# Patient Record
Sex: Male | Born: 1950 | Race: White | Hispanic: No | Smoking: Never smoker
Health system: Southern US, Community
[De-identification: ages and names within clinical notes are randomized; demographics above are authoritative.]

## PROBLEM LIST (undated history)

## (undated) DIAGNOSIS — K635 Polyp of colon: Secondary | ICD-10-CM

## (undated) DIAGNOSIS — L570 Actinic keratosis: Secondary | ICD-10-CM

## (undated) DIAGNOSIS — M1990 Unspecified osteoarthritis, unspecified site: Secondary | ICD-10-CM

## (undated) DIAGNOSIS — E785 Hyperlipidemia, unspecified: Secondary | ICD-10-CM

## (undated) DIAGNOSIS — K5090 Crohn's disease, unspecified, without complications: Secondary | ICD-10-CM

## (undated) DIAGNOSIS — E538 Deficiency of other specified B group vitamins: Secondary | ICD-10-CM

## (undated) DIAGNOSIS — C4490 Unspecified malignant neoplasm of skin, unspecified: Secondary | ICD-10-CM

## (undated) DIAGNOSIS — E039 Hypothyroidism, unspecified: Secondary | ICD-10-CM

## (undated) DIAGNOSIS — I700 Atherosclerosis of aorta: Secondary | ICD-10-CM

## (undated) DIAGNOSIS — G4730 Sleep apnea, unspecified: Secondary | ICD-10-CM

## (undated) DIAGNOSIS — K449 Diaphragmatic hernia without obstruction or gangrene: Secondary | ICD-10-CM

## (undated) DIAGNOSIS — N400 Enlarged prostate without lower urinary tract symptoms: Secondary | ICD-10-CM

## (undated) DIAGNOSIS — Z87442 Personal history of urinary calculi: Secondary | ICD-10-CM

## (undated) DIAGNOSIS — G20 Parkinson's disease: Secondary | ICD-10-CM

## (undated) DIAGNOSIS — N281 Cyst of kidney, acquired: Secondary | ICD-10-CM

## (undated) DIAGNOSIS — G4733 Obstructive sleep apnea (adult) (pediatric): Secondary | ICD-10-CM

## (undated) DIAGNOSIS — K219 Gastro-esophageal reflux disease without esophagitis: Secondary | ICD-10-CM

## (undated) DIAGNOSIS — K4020 Bilateral inguinal hernia, without obstruction or gangrene, not specified as recurrent: Secondary | ICD-10-CM

## (undated) DIAGNOSIS — G459 Transient cerebral ischemic attack, unspecified: Secondary | ICD-10-CM

## (undated) DIAGNOSIS — I4510 Unspecified right bundle-branch block: Secondary | ICD-10-CM

## (undated) DIAGNOSIS — N200 Calculus of kidney: Secondary | ICD-10-CM

## (undated) DIAGNOSIS — K5790 Diverticulosis of intestine, part unspecified, without perforation or abscess without bleeding: Secondary | ICD-10-CM

## (undated) DIAGNOSIS — C8290 Follicular lymphoma, unspecified, unspecified site: Secondary | ICD-10-CM

## (undated) DIAGNOSIS — G20A1 Parkinson's disease without dyskinesia, without mention of fluctuations: Secondary | ICD-10-CM

## (undated) DIAGNOSIS — I10 Essential (primary) hypertension: Secondary | ICD-10-CM

## (undated) HISTORY — PX: PORTACATH PLACEMENT: SHX2246

## (undated) HISTORY — DX: Actinic keratosis: L57.0

## (undated) HISTORY — PX: COLONOSCOPY: SHX174

## (undated) HISTORY — DX: Unspecified malignant neoplasm of skin, unspecified: C44.90

## (undated) HISTORY — PX: ESOPHAGOGASTRODUODENOSCOPY: SHX1529

## (undated) HISTORY — DX: Personal history of urinary calculi: Z87.442

## (undated) HISTORY — DX: Gastro-esophageal reflux disease without esophagitis: K21.9

## (undated) HISTORY — PX: CHOLECYSTECTOMY: SHX55

## (undated) HISTORY — PX: LYMPH NODE DISSECTION: SHX5087

## (undated) HISTORY — DX: Hypothyroidism, unspecified: E03.9

## (undated) HISTORY — PX: PORT-A-CATH REMOVAL: SHX5289

---

## 2006-06-12 ENCOUNTER — Ambulatory Visit: Payer: Self-pay | Admitting: Gastroenterology

## 2006-06-19 ENCOUNTER — Ambulatory Visit: Payer: Self-pay | Admitting: Gastroenterology

## 2006-10-09 ENCOUNTER — Ambulatory Visit: Payer: Self-pay | Admitting: Gastroenterology

## 2012-11-24 DIAGNOSIS — C8290 Follicular lymphoma, unspecified, unspecified site: Secondary | ICD-10-CM

## 2012-11-24 DIAGNOSIS — C8330 Diffuse large B-cell lymphoma, unspecified site: Secondary | ICD-10-CM

## 2012-11-24 HISTORY — DX: Diffuse large B-cell lymphoma, unspecified site: C83.30

## 2012-11-24 HISTORY — DX: Follicular lymphoma, unspecified, unspecified site: C82.90

## 2013-04-04 ENCOUNTER — Inpatient Hospital Stay: Payer: Self-pay | Admitting: Surgery

## 2013-04-04 ENCOUNTER — Ambulatory Visit: Payer: Self-pay | Admitting: Internal Medicine

## 2013-04-04 LAB — COMPREHENSIVE METABOLIC PANEL
Albumin: 4.2 g/dL (ref 3.4–5.0)
Anion Gap: 6 — ABNORMAL LOW (ref 7–16)
Calcium, Total: 9.5 mg/dL (ref 8.5–10.1)
Creatinine: 0.73 mg/dL (ref 0.60–1.30)
EGFR (African American): >60
EGFR (Non-African Amer.): >60
Glucose: 111 mg/dL — ABNORMAL HIGH (ref 65–99)
SGOT(AST): 25 U/L (ref 15–37)
SGPT (ALT): 38 U/L (ref 12–78)
Sodium: 135 mmol/L — ABNORMAL LOW (ref 136–145)
Total Protein: 8.2 g/dL (ref 6.4–8.2)

## 2013-04-04 LAB — CBC
HCT: 48.5 % (ref 40.0–52.0)
MCH: 28.6 pg (ref 26.0–34.0)
MCV: 83 fL (ref 80–100)
RBC: 5.85 10*6/uL (ref 4.40–5.90)
WBC: 18 10*3/uL — ABNORMAL HIGH (ref 3.8–10.6)

## 2013-04-04 LAB — URINALYSIS, COMPLETE
Glucose,UR: NEGATIVE mg/dL (ref 0–75)
Leukocyte Esterase: NEGATIVE
Nitrite: NEGATIVE
RBC,UR: 4 /HPF (ref 0–5)
Specific Gravity: 1.016 (ref 1.003–1.030)
Squamous Epithelial: NONE SEEN
WBC UR: <1 /HPF (ref 0–5)

## 2013-04-05 LAB — COMPREHENSIVE METABOLIC PANEL
Alkaline Phosphatase: 93 U/L (ref 50–136)
Anion Gap: 3 — ABNORMAL LOW (ref 7–16)
BUN: 9 mg/dL (ref 7–18)
Bilirubin,Total: 1 mg/dL (ref 0.2–1.0)
Calcium, Total: 8 mg/dL — ABNORMAL LOW (ref 8.5–10.1)
Chloride: 104 mmol/L (ref 98–107)
Co2: 30 mmol/L (ref 21–32)
Creatinine: 0.84 mg/dL (ref 0.60–1.30)
Glucose: 122 mg/dL — ABNORMAL HIGH (ref 65–99)
Osmolality: 274 (ref 275–301)
SGOT(AST): 21 U/L (ref 15–37)

## 2013-04-05 LAB — CBC WITH DIFFERENTIAL/PLATELET
Eosinophil #: 0.1 10*3/uL (ref 0.0–0.7)
HCT: 42.2 % (ref 40.0–52.0)
Lymphocyte %: 7.4 %
MCHC: 35 g/dL (ref 32.0–36.0)
MCV: 83 fL (ref 80–100)
Monocyte #: 1.7 x10 3/mm — ABNORMAL HIGH (ref 0.2–1.0)
Monocyte %: 12.7 %
RBC: 5.09 10*6/uL (ref 4.40–5.90)
RDW: 13.9 % (ref 11.5–14.5)
WBC: 13.5 10*3/uL — ABNORMAL HIGH (ref 3.8–10.6)

## 2013-04-05 LAB — PROTIME-INR
INR: 1.1
Prothrombin Time: 13.9 secs (ref 11.5–14.7)

## 2013-04-05 LAB — AMYLASE: Amylase: 27 U/L (ref 25–115)

## 2013-04-05 LAB — LIPASE, BLOOD: Lipase: 70 U/L — ABNORMAL LOW (ref 73–393)

## 2013-04-07 LAB — COMPREHENSIVE METABOLIC PANEL
Albumin: 2.8 g/dL — ABNORMAL LOW (ref 3.4–5.0)
Anion Gap: 4 — ABNORMAL LOW (ref 7–16)
BUN: 6 mg/dL — ABNORMAL LOW (ref 7–18)
Bilirubin,Total: 0.5 mg/dL (ref 0.2–1.0)
Calcium, Total: 8.7 mg/dL (ref 8.5–10.1)
Chloride: 106 mmol/L (ref 98–107)
Creatinine: 0.75 mg/dL (ref 0.60–1.30)
EGFR (African American): >60
Glucose: 111 mg/dL — ABNORMAL HIGH (ref 65–99)
Osmolality: 276 (ref 275–301)
Potassium: 3.8 mmol/L (ref 3.5–5.1)
SGPT (ALT): 61 U/L (ref 12–78)
Sodium: 139 mmol/L (ref 136–145)
Total Protein: 6.4 g/dL (ref 6.4–8.2)

## 2013-04-07 LAB — CBC WITH DIFFERENTIAL/PLATELET
Basophil #: 0 10*3/uL (ref 0.0–0.1)
Basophil %: 0.3 %
Eosinophil #: 0.2 10*3/uL (ref 0.0–0.7)
Eosinophil %: 1.7 %
Lymphocyte #: 0.9 10*3/uL — ABNORMAL LOW (ref 1.0–3.6)
Lymphocyte %: 9.7 %
MCHC: 34.3 g/dL (ref 32.0–36.0)
Monocyte #: 1 x10 3/mm (ref 0.2–1.0)
Neutrophil #: 7.2 10*3/uL — ABNORMAL HIGH (ref 1.4–6.5)
Platelet: 203 10*3/uL (ref 150–440)
RDW: 14.2 % (ref 11.5–14.5)
WBC: 9.3 10*3/uL (ref 3.8–10.6)

## 2013-07-27 ENCOUNTER — Ambulatory Visit: Payer: Self-pay | Admitting: Unknown Physician Specialty

## 2013-08-01 ENCOUNTER — Ambulatory Visit: Payer: Self-pay | Admitting: Oncology

## 2013-08-02 ENCOUNTER — Ambulatory Visit: Payer: Self-pay | Admitting: Oncology

## 2013-08-02 LAB — COMPREHENSIVE METABOLIC PANEL
Albumin: 3.7 g/dL (ref 3.4–5.0)
Alkaline Phosphatase: 117 U/L (ref 50–136)
BUN: 13 mg/dL (ref 7–18)
Bilirubin,Total: 0.5 mg/dL (ref 0.2–1.0)
Calcium, Total: 9.2 mg/dL (ref 8.5–10.1)
Chloride: 101 mmol/L (ref 98–107)
Co2: 31 mmol/L (ref 21–32)
EGFR (Non-African Amer.): >60
Glucose: 133 mg/dL — ABNORMAL HIGH (ref 65–99)
Osmolality: 278 (ref 275–301)
Potassium: 3.8 mmol/L (ref 3.5–5.1)
Total Protein: 7.6 g/dL (ref 6.4–8.2)

## 2013-08-02 LAB — CBC CANCER CENTER
Basophil %: 1.1 %
Eosinophil #: 0.2 x10 3/mm (ref 0.0–0.7)
HCT: 48.6 % (ref 40.0–52.0)
HGB: 16.7 g/dL (ref 13.0–18.0)
Lymphocyte %: 16.8 %
MCH: 29.1 pg (ref 26.0–34.0)
MCV: 85 fL (ref 80–100)
Monocyte #: 1.2 x10 3/mm — ABNORMAL HIGH (ref 0.2–1.0)
Platelet: 236 x10 3/mm (ref 150–440)
RBC: 5.74 10*6/uL (ref 4.40–5.90)
RDW: 13.9 % (ref 11.5–14.5)

## 2013-08-08 ENCOUNTER — Ambulatory Visit: Payer: Self-pay | Admitting: Oncology

## 2013-08-10 LAB — CBC CANCER CENTER
Bands: 2 %
Comment - H1-Com1: NORMAL
Comment - H1-Com2: NORMAL
HCT: 46.9 % (ref 40.0–52.0)
MCH: 28.5 pg (ref 26.0–34.0)
MCV: 84 fL (ref 80–100)
Monocytes: 10 %
Myelocyte: 2 %
Platelet: 318 x10 3/mm (ref 150–440)
Segmented Neutrophils: 64 %
WBC: 7.5 x10 3/mm (ref 3.8–10.6)

## 2013-08-22 ENCOUNTER — Ambulatory Visit: Payer: Self-pay | Admitting: Unknown Physician Specialty

## 2013-08-23 ENCOUNTER — Ambulatory Visit: Payer: Self-pay | Admitting: Unknown Physician Specialty

## 2013-08-24 ENCOUNTER — Ambulatory Visit: Payer: Self-pay | Admitting: Oncology

## 2013-09-02 ENCOUNTER — Ambulatory Visit: Payer: Self-pay | Admitting: Surgery

## 2013-09-06 LAB — COMPREHENSIVE METABOLIC PANEL
Albumin: 3.4 g/dL (ref 3.4–5.0)
Alkaline Phosphatase: 111 U/L (ref 50–136)
BUN: 15 mg/dL (ref 7–18)
Bilirubin,Total: 0.4 mg/dL (ref 0.2–1.0)
Chloride: 103 mmol/L (ref 98–107)
Co2: 27 mmol/L (ref 21–32)
Creatinine: 1.08 mg/dL (ref 0.60–1.30)
EGFR (African American): >60
EGFR (Non-African Amer.): >60
Glucose: 158 mg/dL — ABNORMAL HIGH (ref 65–99)
Potassium: 3.8 mmol/L (ref 3.5–5.1)
SGOT(AST): 18 U/L (ref 15–37)
SGPT (ALT): 33 U/L (ref 12–78)

## 2013-09-06 LAB — CBC CANCER CENTER
Basophil #: 0.1 x10 3/mm (ref 0.0–0.1)
Basophil %: 0.7 %
Eosinophil #: 0.4 x10 3/mm (ref 0.0–0.7)
HCT: 47.7 % (ref 40.0–52.0)
Lymphocyte #: 1.9 x10 3/mm (ref 1.0–3.6)
Lymphocyte %: 19.3 %
MCH: 28.9 pg (ref 26.0–34.0)
MCHC: 33.8 g/dL (ref 32.0–36.0)
MCV: 85 fL (ref 80–100)
Neutrophil #: 6.6 x10 3/mm — ABNORMAL HIGH (ref 1.4–6.5)
Neutrophil %: 68.5 %
RBC: 5.59 10*6/uL (ref 4.40–5.90)
WBC: 9.7 x10 3/mm (ref 3.8–10.6)

## 2013-09-14 LAB — CBC CANCER CENTER
Basophil %: 1.2 %
Eosinophil #: 0.3 x10 3/mm (ref 0.0–0.7)
HGB: 15 g/dL (ref 13.0–18.0)
Lymphocyte #: 1.2 x10 3/mm (ref 1.0–3.6)
Lymphocyte %: 25.5 %
MCH: 28.9 pg (ref 26.0–34.0)
MCV: 85 fL (ref 80–100)
Monocyte #: 0.4 x10 3/mm (ref 0.2–1.0)
Monocyte %: 9.3 %
Neutrophil #: 2.6 x10 3/mm (ref 1.4–6.5)
RBC: 5.18 10*6/uL (ref 4.40–5.90)
RDW: 13.4 % (ref 11.5–14.5)
WBC: 4.6 x10 3/mm (ref 3.8–10.6)

## 2013-09-21 LAB — CBC CANCER CENTER
Basophil #: 0.1 x10 3/mm (ref 0.0–0.1)
Basophil %: 0.6 %
HCT: 43.3 % (ref 40.0–52.0)
HGB: 14.5 g/dL (ref 13.0–18.0)
Lymphocyte #: 1.8 x10 3/mm (ref 1.0–3.6)
Lymphocyte %: 12 %
MCH: 28.5 pg (ref 26.0–34.0)
MCV: 85 fL (ref 80–100)
Monocyte #: 1.1 x10 3/mm — ABNORMAL HIGH (ref 0.2–1.0)
Neutrophil %: 79.5 %
Platelet: 183 x10 3/mm (ref 150–440)
RBC: 5.08 10*6/uL (ref 4.40–5.90)
RDW: 14 % (ref 11.5–14.5)
WBC: 15.3 x10 3/mm — ABNORMAL HIGH (ref 3.8–10.6)

## 2013-09-24 ENCOUNTER — Ambulatory Visit: Payer: Self-pay | Admitting: Oncology

## 2013-09-28 LAB — CBC CANCER CENTER
Basophil %: 0.6 %
Eosinophil #: 0.1 x10 3/mm (ref 0.0–0.7)
HCT: 43.3 % (ref 40.0–52.0)
HGB: 14.6 g/dL (ref 13.0–18.0)
MCHC: 33.7 g/dL (ref 32.0–36.0)
MCV: 85 fL (ref 80–100)
Monocyte #: 1.1 x10 3/mm — ABNORMAL HIGH (ref 0.2–1.0)
Monocyte %: 12.9 %
Neutrophil %: 68.6 %
Platelet: 379 x10 3/mm (ref 150–440)
RDW: 13.8 % (ref 11.5–14.5)
WBC: 8.6 x10 3/mm (ref 3.8–10.6)

## 2013-09-28 LAB — COMPREHENSIVE METABOLIC PANEL
Albumin: 3.5 g/dL (ref 3.4–5.0)
Anion Gap: 6 — ABNORMAL LOW (ref 7–16)
BUN: 13 mg/dL (ref 7–18)
Calcium, Total: 8.7 mg/dL (ref 8.5–10.1)
Co2: 28 mmol/L (ref 21–32)
Creatinine: 0.96 mg/dL (ref 0.60–1.30)
EGFR (Non-African Amer.): >60
Glucose: 99 mg/dL (ref 65–99)
Osmolality: 276 (ref 275–301)
SGOT(AST): 23 U/L (ref 15–37)
SGPT (ALT): 50 U/L (ref 12–78)
Sodium: 138 mmol/L (ref 136–145)
Total Protein: 7.1 g/dL (ref 6.4–8.2)

## 2013-10-05 LAB — CBC CANCER CENTER
Basophil #: 0.1 x10 3/mm (ref 0.0–0.1)
Basophil %: 1.3 %
Eosinophil #: 0.1 x10 3/mm (ref 0.0–0.7)
Eosinophil %: 1.5 %
Lymphocyte %: 12.3 %
MCH: 28.6 pg (ref 26.0–34.0)
MCV: 85 fL (ref 80–100)
Monocyte #: 0.7 x10 3/mm (ref 0.2–1.0)
Monocyte %: 7.2 %
Neutrophil #: 7.2 x10 3/mm — ABNORMAL HIGH (ref 1.4–6.5)
Neutrophil %: 77.7 %
RBC: 4.71 10*6/uL (ref 4.40–5.90)
RDW: 14.1 % (ref 11.5–14.5)
WBC: 9.3 x10 3/mm (ref 3.8–10.6)

## 2013-10-12 LAB — CBC CANCER CENTER
Basophil #: 0.1 x10 3/mm (ref 0.0–0.1)
Basophil %: 0.6 %
Eosinophil %: 1.1 %
HCT: 41.1 % (ref 40.0–52.0)
Lymphocyte %: 7 %
MCV: 85 fL (ref 80–100)
Monocyte #: 1.9 x10 3/mm — ABNORMAL HIGH (ref 0.2–1.0)
Neutrophil #: 18.1 x10 3/mm — ABNORMAL HIGH (ref 1.4–6.5)
Neutrophil %: 82.6 %
Platelet: 214 x10 3/mm (ref 150–440)
RBC: 4.81 10*6/uL (ref 4.40–5.90)

## 2013-10-19 LAB — CBC CANCER CENTER
Basophil #: 0 x10 3/mm (ref 0.0–0.1)
Eosinophil #: 0.1 x10 3/mm (ref 0.0–0.7)
HGB: 13.9 g/dL (ref 13.0–18.0)
Lymphocyte #: 1.2 x10 3/mm (ref 1.0–3.6)
Lymphocyte %: 14.8 %
MCH: 28.8 pg (ref 26.0–34.0)
MCV: 86 fL (ref 80–100)
Monocyte #: 1.1 x10 3/mm — ABNORMAL HIGH (ref 0.2–1.0)
Monocyte %: 13.7 %
Platelet: 313 x10 3/mm (ref 150–440)
RBC: 4.81 10*6/uL (ref 4.40–5.90)
RDW: 15.9 % — ABNORMAL HIGH (ref 11.5–14.5)

## 2013-10-19 LAB — COMPREHENSIVE METABOLIC PANEL
Alkaline Phosphatase: 98 U/L
BUN: 15 mg/dL (ref 7–18)
Calcium, Total: 8.7 mg/dL (ref 8.5–10.1)
Chloride: 104 mmol/L (ref 98–107)
EGFR (African American): >60
Potassium: 4 mmol/L (ref 3.5–5.1)
SGOT(AST): 18 U/L (ref 15–37)
Sodium: 141 mmol/L (ref 136–145)
Total Protein: 6.9 g/dL (ref 6.4–8.2)

## 2013-10-24 ENCOUNTER — Ambulatory Visit: Payer: Self-pay | Admitting: Oncology

## 2013-10-26 LAB — CBC CANCER CENTER
Basophil #: 0 x10 3/mm (ref 0.0–0.1)
Basophil %: 0.3 %
Eosinophil #: 0.2 x10 3/mm (ref 0.0–0.7)
HCT: 38.9 % — ABNORMAL LOW (ref 40.0–52.0)
HGB: 13.1 g/dL (ref 13.0–18.0)
Lymphocyte #: 1.9 x10 3/mm (ref 1.0–3.6)
MCHC: 33.7 g/dL (ref 32.0–36.0)
MCV: 86 fL (ref 80–100)
Monocyte #: 1.2 x10 3/mm — ABNORMAL HIGH (ref 0.2–1.0)
Platelet: 291 x10 3/mm (ref 150–440)
RDW: 15.8 % — ABNORMAL HIGH (ref 11.5–14.5)

## 2013-11-02 LAB — CBC CANCER CENTER
Basophil #: 0.2 x10 3/mm — ABNORMAL HIGH (ref 0.0–0.1)
Basophil %: 1.3 %
Eosinophil #: 0.2 x10 3/mm (ref 0.0–0.7)
Eosinophil %: 1.7 %
HCT: 41.8 % (ref 40.0–52.0)
HGB: 13.7 g/dL (ref 13.0–18.0)
Lymphocyte #: 1.5 x10 3/mm (ref 1.0–3.6)
MCH: 28.8 pg (ref 26.0–34.0)
MCHC: 32.7 g/dL (ref 32.0–36.0)
MCV: 88 fL (ref 80–100)
Monocyte %: 10.9 %
Neutrophil #: 9.7 x10 3/mm — ABNORMAL HIGH (ref 1.4–6.5)
Neutrophil %: 74.5 %
WBC: 13 x10 3/mm — ABNORMAL HIGH (ref 3.8–10.6)

## 2013-11-09 LAB — COMPREHENSIVE METABOLIC PANEL
Albumin: 3.5 g/dL (ref 3.4–5.0)
BUN: 15 mg/dL (ref 7–18)
Calcium, Total: 8.4 mg/dL — ABNORMAL LOW (ref 8.5–10.1)
EGFR (African American): >60
EGFR (Non-African Amer.): >60
Glucose: 98 mg/dL (ref 65–99)
Osmolality: 284 (ref 275–301)
SGOT(AST): 19 U/L (ref 15–37)
SGPT (ALT): 40 U/L (ref 12–78)
Sodium: 142 mmol/L (ref 136–145)

## 2013-11-09 LAB — CBC CANCER CENTER
Basophil %: 1 %
Eosinophil #: 0.1 x10 3/mm (ref 0.0–0.7)
Eosinophil %: 1.8 %
MCV: 87 fL (ref 80–100)
Monocyte #: 0.9 x10 3/mm (ref 0.2–1.0)
Monocyte %: 14.2 %
Platelet: 244 x10 3/mm (ref 150–440)
RDW: 17 % — ABNORMAL HIGH (ref 11.5–14.5)

## 2013-11-21 ENCOUNTER — Ambulatory Visit: Payer: Self-pay | Admitting: Oncology

## 2013-11-24 ENCOUNTER — Ambulatory Visit: Payer: Self-pay | Admitting: Oncology

## 2013-12-07 LAB — CBC CANCER CENTER
Basophil #: 0.1 x10 3/mm (ref 0.0–0.1)
Basophil %: 1.1 %
EOS ABS: 0.2 x10 3/mm (ref 0.0–0.7)
Eosinophil %: 3.6 %
HCT: 41.4 % (ref 40.0–52.0)
HGB: 14 g/dL (ref 13.0–18.0)
LYMPHS ABS: 0.9 x10 3/mm — AB (ref 1.0–3.6)
Lymphocyte %: 15.6 %
MCH: 29.6 pg (ref 26.0–34.0)
MCHC: 33.7 g/dL (ref 32.0–36.0)
MCV: 88 fL (ref 80–100)
MONO ABS: 0.9 x10 3/mm (ref 0.2–1.0)
Monocyte %: 15.6 %
Neutrophil #: 3.7 x10 3/mm (ref 1.4–6.5)
Neutrophil %: 64.1 %
Platelet: 331 x10 3/mm (ref 150–440)
RBC: 4.72 10*6/uL (ref 4.40–5.90)
RDW: 15.8 % — ABNORMAL HIGH (ref 11.5–14.5)
WBC: 5.8 x10 3/mm (ref 3.8–10.6)

## 2013-12-07 LAB — COMPREHENSIVE METABOLIC PANEL
ALT: 54 U/L (ref 12–78)
Albumin: 3.9 g/dL (ref 3.4–5.0)
Alkaline Phosphatase: 82 U/L
Anion Gap: 9 (ref 7–16)
BUN: 10 mg/dL (ref 7–18)
Bilirubin,Total: 0.3 mg/dL (ref 0.2–1.0)
CHLORIDE: 103 mmol/L (ref 98–107)
Calcium, Total: 9.4 mg/dL (ref 8.5–10.1)
Co2: 28 mmol/L (ref 21–32)
Creatinine: 0.8 mg/dL (ref 0.60–1.30)
EGFR (Non-African Amer.): >60
GLUCOSE: 98 mg/dL (ref 65–99)
Osmolality: 278 (ref 275–301)
Potassium: 3.8 mmol/L (ref 3.5–5.1)
SGOT(AST): 34 U/L (ref 15–37)
Sodium: 140 mmol/L (ref 136–145)
Total Protein: 7.1 g/dL (ref 6.4–8.2)

## 2013-12-25 ENCOUNTER — Ambulatory Visit: Payer: Self-pay | Admitting: Oncology

## 2014-01-04 LAB — CBC CANCER CENTER
BASOS PCT: 0.5 %
Basophil #: 0 x10 3/mm (ref 0.0–0.1)
EOS ABS: 0.2 x10 3/mm (ref 0.0–0.7)
Eosinophil %: 4.2 %
HCT: 43.7 % (ref 40.0–52.0)
HGB: 14.6 g/dL (ref 13.0–18.0)
LYMPHS ABS: 0.7 x10 3/mm — AB (ref 1.0–3.6)
Lymphocyte %: 12.2 %
MCH: 29.4 pg (ref 26.0–34.0)
MCHC: 33.3 g/dL (ref 32.0–36.0)
MCV: 88 fL (ref 80–100)
MONO ABS: 0.7 x10 3/mm (ref 0.2–1.0)
MONOS PCT: 11.8 %
Neutrophil #: 4 x10 3/mm (ref 1.4–6.5)
Neutrophil %: 71.3 %
Platelet: 247 x10 3/mm (ref 150–440)
RBC: 4.95 10*6/uL (ref 4.40–5.90)
RDW: 12.8 % (ref 11.5–14.5)
WBC: 5.6 x10 3/mm (ref 3.8–10.6)

## 2014-01-11 LAB — CBC CANCER CENTER
BASOS PCT: 0.2 %
Basophil #: 0 x10 3/mm (ref 0.0–0.1)
EOS ABS: 0 x10 3/mm (ref 0.0–0.7)
Eosinophil %: 0.1 %
HCT: 46.1 % (ref 40.0–52.0)
HGB: 15.2 g/dL (ref 13.0–18.0)
LYMPHS PCT: 5.1 %
Lymphocyte #: 0.6 x10 3/mm — ABNORMAL LOW (ref 1.0–3.6)
MCH: 29.2 pg (ref 26.0–34.0)
MCHC: 33 g/dL (ref 32.0–36.0)
MCV: 89 fL (ref 80–100)
MONO ABS: 1.3 x10 3/mm — AB (ref 0.2–1.0)
MONOS PCT: 11.5 %
NEUTROS PCT: 83.1 %
Neutrophil #: 9.4 x10 3/mm — ABNORMAL HIGH (ref 1.4–6.5)
Platelet: 298 x10 3/mm (ref 150–440)
RBC: 5.21 10*6/uL (ref 4.40–5.90)
RDW: 13.1 % (ref 11.5–14.5)
WBC: 11.3 x10 3/mm — ABNORMAL HIGH (ref 3.8–10.6)

## 2014-01-22 ENCOUNTER — Ambulatory Visit: Payer: Self-pay | Admitting: Oncology

## 2014-03-08 ENCOUNTER — Ambulatory Visit: Payer: Self-pay | Admitting: Oncology

## 2014-03-08 LAB — COMPREHENSIVE METABOLIC PANEL
ALT: 36 U/L (ref 12–78)
Albumin: 3.5 g/dL (ref 3.4–5.0)
Alkaline Phosphatase: 81 U/L
Anion Gap: 8 (ref 7–16)
BUN: 15 mg/dL (ref 7–18)
Bilirubin,Total: 0.4 mg/dL (ref 0.2–1.0)
CHLORIDE: 102 mmol/L (ref 98–107)
Calcium, Total: 8.8 mg/dL (ref 8.5–10.1)
Co2: 30 mmol/L (ref 21–32)
Creatinine: 0.8 mg/dL (ref 0.60–1.30)
EGFR (African American): >60
Glucose: 112 mg/dL — ABNORMAL HIGH (ref 65–99)
Osmolality: 281 (ref 275–301)
Potassium: 3.5 mmol/L (ref 3.5–5.1)
SGOT(AST): 19 U/L (ref 15–37)
SODIUM: 140 mmol/L (ref 136–145)
TOTAL PROTEIN: 6.8 g/dL (ref 6.4–8.2)

## 2014-03-08 LAB — CBC CANCER CENTER
BASOS ABS: 0.1 x10 3/mm (ref 0.0–0.1)
Basophil %: 1.1 %
Eosinophil #: 0.2 x10 3/mm (ref 0.0–0.7)
Eosinophil %: 3.8 %
HCT: 41.9 % (ref 40.0–52.0)
HGB: 14.1 g/dL (ref 13.0–18.0)
LYMPHS PCT: 12.6 %
Lymphocyte #: 0.7 x10 3/mm — ABNORMAL LOW (ref 1.0–3.6)
MCH: 29 pg (ref 26.0–34.0)
MCHC: 33.7 g/dL (ref 32.0–36.0)
MCV: 86 fL (ref 80–100)
Monocyte #: 0.9 x10 3/mm (ref 0.2–1.0)
Monocyte %: 16.5 %
Neutrophil #: 3.6 x10 3/mm (ref 1.4–6.5)
Neutrophil %: 66 %
Platelet: 257 x10 3/mm (ref 150–440)
RBC: 4.87 10*6/uL (ref 4.40–5.90)
RDW: 14.3 % (ref 11.5–14.5)
WBC: 5.4 x10 3/mm (ref 3.8–10.6)

## 2014-03-08 LAB — TSH: Thyroid Stimulating Horm: 2.83 u[IU]/mL

## 2014-03-11 DIAGNOSIS — K219 Gastro-esophageal reflux disease without esophagitis: Secondary | ICD-10-CM | POA: Insufficient documentation

## 2014-03-24 ENCOUNTER — Ambulatory Visit: Payer: Self-pay | Admitting: Oncology

## 2014-05-22 ENCOUNTER — Ambulatory Visit: Payer: Self-pay | Admitting: Gastroenterology

## 2014-05-30 ENCOUNTER — Ambulatory Visit: Payer: Self-pay | Admitting: Anesthesiology

## 2014-05-30 LAB — POTASSIUM: POTASSIUM: 3.5 mmol/L (ref 3.5–5.1)

## 2014-06-01 ENCOUNTER — Ambulatory Visit: Payer: Self-pay | Admitting: Surgery

## 2014-06-05 ENCOUNTER — Ambulatory Visit: Payer: Self-pay | Admitting: Gastroenterology

## 2014-06-07 LAB — PATHOLOGY REPORT

## 2014-06-28 ENCOUNTER — Ambulatory Visit: Payer: Self-pay | Admitting: Urology

## 2014-06-29 ENCOUNTER — Ambulatory Visit: Payer: Self-pay | Admitting: Gastroenterology

## 2014-07-05 ENCOUNTER — Ambulatory Visit: Payer: Self-pay | Admitting: Oncology

## 2014-07-06 ENCOUNTER — Ambulatory Visit: Payer: Self-pay | Admitting: Urology

## 2014-07-10 ENCOUNTER — Ambulatory Visit: Payer: Self-pay | Admitting: Oncology

## 2014-07-12 ENCOUNTER — Ambulatory Visit: Payer: Self-pay | Admitting: Oncology

## 2014-07-12 LAB — CBC CANCER CENTER
BASOS ABS: 0 x10 3/mm (ref 0.0–0.1)
Basophil %: 1 %
EOS ABS: 0.2 x10 3/mm (ref 0.0–0.7)
Eosinophil %: 3.9 %
HCT: 44.2 % (ref 40.0–52.0)
HGB: 14.9 g/dL (ref 13.0–18.0)
LYMPHS ABS: 0.9 x10 3/mm — AB (ref 1.0–3.6)
Lymphocyte %: 21.3 %
MCH: 28.6 pg (ref 26.0–34.0)
MCHC: 33.8 g/dL (ref 32.0–36.0)
MCV: 85 fL (ref 80–100)
MONO ABS: 0.6 x10 3/mm (ref 0.2–1.0)
Monocyte %: 13.5 %
NEUTROS ABS: 2.5 x10 3/mm (ref 1.4–6.5)
NEUTROS PCT: 60.3 %
Platelet: 211 x10 3/mm (ref 150–440)
RBC: 5.22 10*6/uL (ref 4.40–5.90)
RDW: 13.6 % (ref 11.5–14.5)
WBC: 4.2 x10 3/mm (ref 3.8–10.6)

## 2014-07-12 LAB — COMPREHENSIVE METABOLIC PANEL
ANION GAP: 10 (ref 7–16)
Albumin: 3.7 g/dL (ref 3.4–5.0)
Alkaline Phosphatase: 79 U/L
BUN: 17 mg/dL (ref 7–18)
Bilirubin,Total: 0.5 mg/dL (ref 0.2–1.0)
Calcium, Total: 8.2 mg/dL — ABNORMAL LOW (ref 8.5–10.1)
Chloride: 102 mmol/L (ref 98–107)
Co2: 27 mmol/L (ref 21–32)
Creatinine: 1.16 mg/dL (ref 0.60–1.30)
EGFR (African American): >60
EGFR (Non-African Amer.): >60
Glucose: 106 mg/dL — ABNORMAL HIGH (ref 65–99)
Osmolality: 280 (ref 275–301)
Potassium: 3.6 mmol/L (ref 3.5–5.1)
SGOT(AST): 20 U/L (ref 15–37)
SGPT (ALT): 36 U/L
SODIUM: 139 mmol/L (ref 136–145)
Total Protein: 6.6 g/dL (ref 6.4–8.2)

## 2014-07-12 LAB — TSH: Thyroid Stimulating Horm: 2.72 u[IU]/mL

## 2014-07-18 ENCOUNTER — Ambulatory Visit: Payer: Self-pay | Admitting: Urology

## 2014-07-25 ENCOUNTER — Ambulatory Visit: Payer: Self-pay | Admitting: Oncology

## 2014-08-17 ENCOUNTER — Ambulatory Visit: Payer: Self-pay

## 2014-08-25 DIAGNOSIS — C8510 Unspecified B-cell lymphoma, unspecified site: Secondary | ICD-10-CM | POA: Insufficient documentation

## 2014-08-25 DIAGNOSIS — K5010 Crohn's disease of large intestine without complications: Secondary | ICD-10-CM | POA: Insufficient documentation

## 2014-08-25 DIAGNOSIS — I10 Essential (primary) hypertension: Secondary | ICD-10-CM | POA: Insufficient documentation

## 2014-11-13 ENCOUNTER — Ambulatory Visit: Payer: Self-pay | Admitting: Oncology

## 2014-11-13 LAB — COMPREHENSIVE METABOLIC PANEL
ALBUMIN: 3.2 g/dL — AB (ref 3.4–5.0)
Alkaline Phosphatase: 92 U/L
Anion Gap: 9 (ref 7–16)
BILIRUBIN TOTAL: 0.5 mg/dL (ref 0.2–1.0)
BUN: 11 mg/dL (ref 7–18)
CHLORIDE: 105 mmol/L (ref 98–107)
CREATININE: 1 mg/dL (ref 0.60–1.30)
Calcium, Total: 8.2 mg/dL — ABNORMAL LOW (ref 8.5–10.1)
Co2: 28 mmol/L (ref 21–32)
EGFR (African American): >60
EGFR (Non-African Amer.): >60
Glucose: 175 mg/dL — ABNORMAL HIGH (ref 65–99)
OSMOLALITY: 287 (ref 275–301)
POTASSIUM: 3.4 mmol/L — AB (ref 3.5–5.1)
SGOT(AST): 26 U/L (ref 15–37)
SGPT (ALT): 56 U/L
Sodium: 142 mmol/L (ref 136–145)
Total Protein: 6.6 g/dL (ref 6.4–8.2)

## 2014-11-13 LAB — CBC CANCER CENTER
BASOS ABS: 0 x10 3/mm (ref 0.0–0.1)
Basophil %: 0.9 %
Eosinophil #: 0.1 x10 3/mm (ref 0.0–0.7)
Eosinophil %: 2 %
HCT: 43.9 % (ref 40.0–52.0)
HGB: 14.8 g/dL (ref 13.0–18.0)
LYMPHS PCT: 17.8 %
Lymphocyte #: 0.7 x10 3/mm — ABNORMAL LOW (ref 1.0–3.6)
MCH: 29 pg (ref 26.0–34.0)
MCHC: 33.6 g/dL (ref 32.0–36.0)
MCV: 86 fL (ref 80–100)
MONO ABS: 0.7 x10 3/mm (ref 0.2–1.0)
Monocyte %: 18.2 %
NEUTROS ABS: 2.3 x10 3/mm (ref 1.4–6.5)
Neutrophil %: 61.1 %
Platelet: 146 x10 3/mm — ABNORMAL LOW (ref 150–440)
RBC: 5.1 10*6/uL (ref 4.40–5.90)
RDW: 13.9 % (ref 11.5–14.5)
WBC: 3.8 x10 3/mm (ref 3.8–10.6)

## 2014-11-13 LAB — LACTATE DEHYDROGENASE: LDH: 190 U/L (ref 85–241)

## 2014-11-24 ENCOUNTER — Ambulatory Visit: Payer: Self-pay | Admitting: Oncology

## 2014-12-28 DIAGNOSIS — I4510 Unspecified right bundle-branch block: Secondary | ICD-10-CM | POA: Insufficient documentation

## 2015-01-19 ENCOUNTER — Ambulatory Visit: Payer: Self-pay | Admitting: Oncology

## 2015-03-12 ENCOUNTER — Ambulatory Visit
Admit: 2015-03-12 | Disposition: A | Discharge status: Home / Self Care | Payer: Self-pay | Attending: Oncology | Admitting: Oncology

## 2015-03-12 ENCOUNTER — Other Ambulatory Visit: Payer: Self-pay | Admitting: Oncology

## 2015-03-12 DIAGNOSIS — C8281 Other types of follicular lymphoma, lymph nodes of head, face, and neck: Secondary | ICD-10-CM

## 2015-03-12 LAB — COMPREHENSIVE METABOLIC PANEL
ALBUMIN: 4 g/dL
AST: 26 U/L
Alkaline Phosphatase: 79 U/L
Anion Gap: 6 — ABNORMAL LOW (ref 7–16)
BUN: 15 mg/dL
Bilirubin,Total: 0.8 mg/dL
Calcium, Total: 8.6 mg/dL — ABNORMAL LOW
Chloride: 105 mmol/L
Co2: 29 mmol/L
Creatinine: 0.92 mg/dL
Glucose: 143 mg/dL — ABNORMAL HIGH
Potassium: 3.7 mmol/L
SGPT (ALT): 37 U/L
Sodium: 140 mmol/L
Total Protein: 6.6 g/dL

## 2015-03-12 LAB — LACTATE DEHYDROGENASE: LDH: 141 U/L

## 2015-03-12 LAB — CBC CANCER CENTER
BASOS PCT: 1.3 %
Basophil #: 0.1 x10 3/mm (ref 0.0–0.1)
Eosinophil #: 0.1 x10 3/mm (ref 0.0–0.7)
Eosinophil %: 3.2 %
HCT: 46 % (ref 40.0–52.0)
HGB: 15.5 g/dL (ref 13.0–18.0)
LYMPHS PCT: 23.3 %
Lymphocyte #: 1 x10 3/mm (ref 1.0–3.6)
MCH: 29 pg (ref 26.0–34.0)
MCHC: 33.8 g/dL (ref 32.0–36.0)
MCV: 86 fL (ref 80–100)
Monocyte #: 0.4 x10 3/mm (ref 0.2–1.0)
Monocyte %: 8.9 %
NEUTROS ABS: 2.7 x10 3/mm (ref 1.4–6.5)
Neutrophil %: 63.3 %
Platelet: 193 x10 3/mm (ref 150–440)
RBC: 5.36 10*6/uL (ref 4.40–5.90)
RDW: 14.3 % (ref 11.5–14.5)
WBC: 4.2 x10 3/mm (ref 3.8–10.6)

## 2015-03-16 NOTE — Op Note (Signed)
PATIENT NAME:  Troy Reynolds, Troy Reynolds MR#:  [id number] DATE OF BIRTH:  Aug 31, 1951  DATE OF PROCEDURE:  04/05/2013  PREOPERATIVE DIAGNOSIS: Acute cholecystitis.    POSTOPERATIVE DIAGNOSIS:  Acute cholecystitis.    PROCEDURE PERFORMED:  Cholecystectomy.    ESTIMATED BLOOD LOSS: 25 mL   COMPLICATIONS: None.   ANESTHESIA: General.    SPECIMENS: Gallbladder.   INDICATION FOR SURGERY: Troy Reynolds is a pleasant 64 year old male who presents with acute onset of right upper quadrant pain and leukocytosis. He had an ultrasound, which showed a thickened gallbladder wall and an obstructing stone. He was thus brought to the operating room suite for laparoscopic cholecystectomy.   DETAILS OF PROCEDURE: Informed consent was obtained from [name].  He was  brought to the operating room suite. He was induced. Endotracheal tube was placed, general anesthesia was administered. His abdomen was prepped and draped in standard surgical fashion. A timeout was then performed correctly identifying the patient name, operative site and procedure to be performed. A supraumbilical incision was made. This was taken down to the fascia. The fascia was incised. The peritoneum was entered. Two stay sutures were placed through the fasciotomy. A Hassan trocar was placed in the abdomen. The abdomen was insufflated. The gallbladder appeared to be very thickened and inflamed. An epigastric 11 mm port was placed as well as  two 5 mm ports in the subcostal margin, at the midclavicular and anterior axillary line. The gallbladder was then retracted over the dome of the liver. There were numerous adhesions, which had to be removed from the gallbladder carefully. The cystic artery and cystic duct were dilated out.  These were clipped 3 times and ligated. The gallbladder was then taken off the gallbladder fossa. It in part peeled off due to the edema and inflammation and was brought out through the supraumbilical port site with an Endo Catch bag. The  gallbladder fossa was examined and noted to be hemostatic; however, due to the fact that the gallbladder was relatively peeled off the gallbladder fossa,  I did elect to place a 93 French drain that came out through the right lower under the gallbladder fossa. The gallbladder fossa was examined and Bovie electrocautery was used for hemostasis. The drain was then sutured in place using  4-0 nylon. The gallbladder was taken out through the supraumbilical port site with an Endo Catch bag.  The abdomen was irrigated after I was happy with hemostasis. All trocars were removed. The supraumbilical port site was closed using previously placed stay sutures and the trocar site skin was closed using interrupted 4-0 Monocryl. Sterile dressings were then placed over the wound. The patient was then awoken, extubated and brought to the postanesthesia care unit. There were no immediate complications. Needle, sponge and instrument counts were correct at the end of the procedure.    ____________________________ Glena Norfolk. Mihran Lebarron, MD cal:cc D: 04/05/2013 20:28:23 ET T: 04/05/2013 20:44:38 ET JOB#: [id number]  cc: Harrell Gave A. Brittini Brubeck, MD, <Dictator> Floyde Parkins MD ELECTRONICALLY SIGNED 04/06/2013 15:40

## 2015-03-16 NOTE — Op Note (Signed)
PATIENT NAME:  Troy Reynolds, Troy Reynolds MR#:  [id number] DATE OF BIRTH:  17-Jun-1951  DATE OF SURGERY:  09/02/2013  PREOPERATIVE DIAGNOSIS:  Lymphoma and need for chemotherapy.   POSTOPERATIVE DIAGNOSIS:  Lymphoma and need for chemotherapy.   PROCEDURE PERFORMED:  Ultrasound-guided left internal jugular central venous catheter placement.   COMPLICATIONS:  None.   SURGEON:  Amberlee Garvey A. Jozi Malachi, M.D.   ANESTHESIA:  IV sedation and local anesthetic.   INDICATION FOR SURGERY:  Troy Reynolds is a pleasant 64 year old who was recently diagnosed with lymphoma. He is to undergo chemotherapy. He thus was referred to me for Port-A-Cath placement.   DETAILS OF THE PROCEDURE ARE AS FOLLOWS:  Informed consent was obtained. Troy Reynolds was brought to the Operating Room. He was laid supine on the Operating Room table. He was given IV sedation. His bilateral chest and necks were then prepped and draped in a standard surgical fashion. A time-out was then performed correctly identifying the patient name, the operative site and procedure to be performed. A spot was picked at the lateral two-thirds of his clavicle and the area was numbed and then under his clavicle I placed the access needle. I did not receive any venous blood with 3 attempts. I thus decided to convert to a left internal jugular central venous catheter. I then used ultrasound to visualize his jugular vein. This was accessed with a single stick with a sterile access needle. The wire was then placed through the needle and fluoro was brought into the Operating Room. The wire appeared to go to the atrium and curl back up. I then withdrew it until it was at the cavoatrial junction. I then made a pocket on his chest, and the port was secured to the chest wall with a 3-0 Prolene suture. I then tunneled the catheter from the chest site to the access point and then placed the dilator over the wire and passed it until it went easy to dilate the tract. I then placed the  dilator and sheath over the wire and removed the dilator and wire. I then placed the tunneled catheter through the sheath until it was at the cavoatrial junction. I then broke sheath and removed it. I then placed the port onto the more distal end of the catheter. I then accessed it with a Huber needle. It flushed and drew easily. I then placed the port in the pocket and again tested it and made sure it worked okay. The pocket was then noted to be hemostatic. I then closed the stick site with a single 3-0 Vicryl and then closed the port insertion site with 2 layers, a 3-0 Vicryl interrupted deep dermal and a 4-0 Monocryl subcuticular. Dermabond was then placed over both incisions. The patient was then awoken, extubated, and brought to the Postanesthesia Care Unit. There were no immediate complications. Needle, sponge, and instrument counts were correct at the end of the procedure.   ____________________________ Glena Norfolk. Derreon Consalvo, MD cal:jm D: 09/02/2013 14:23:29 ET T: 09/02/2013 15:03:29 ET JOB#: [id number]  cc: Harrell Gave A. Reagen Goates, MD, <Dictator> Floyde Parkins MD ELECTRONICALLY SIGNED 09/05/2013 21:12

## 2015-03-16 NOTE — Discharge Summary (Signed)
PATIENT NAME:  Troy Reynolds, Troy Reynolds MR#:  [id number] DATE OF BIRTH:  06-Mar-1951  DATE OF ADMISSION:  04/04/2013 DATE OF DISCHARGE:  04/07/2013  DISCHARGE DIAGNOSIS: Acute cholecystitis.   PROCEDURE PERFORMED: Laparoscopic cholecystectomy.   DISCHARGE MEDICATIONS: As follows: 1. Omeprazole 20 mg p.o. daily.  2. Percocet 1 tab p.o. q.6 hours p.r.n. pain.   INDICATION FOR ADMISSION: As follows: [name] is a pleasant 64 year old who was admitted with acute cholecystitis. He was admitted for a cholecystectomy.   HOSPITAL COURSE: As follows: [name] was admitted for cholecystitis. He was begun on IV antibiotics. On hospital day 2, he underwent laparoscopic cholecystectomy. It was markedly inflamed. Postoperatively, he had issues with pain control and nausea, which had resolved at the time of discharge. At the time of discharge, he was tolerating good p.o. with good p.o. pain control and was voiding and stooling without difficulties.   DISCHARGE INSTRUCTIONS AND FOLLOWUP: Mr. Passage is to follow up with me in approximately 1 week. He is to call or return to the ED if he has increased pain, nausea, vomiting, fevers or redness or drainage from incision.   ____________________________ Glena Norfolk Katha Kuehne, MD cal:OSi D: 04/14/2013 12:50:25 ET T: 04/14/2013 13:29:44 ET JOB#: [id number]  cc: Harrell Gave A. Mukesh Kornegay, MD, <Dictator> Floyde Parkins MD ELECTRONICALLY SIGNED 04/18/2013 18:46

## 2015-03-16 NOTE — H&P (Signed)
PATIENT NAME:  Troy Reynolds, Troy Reynolds MR#:  [id number] DATE OF BIRTH:  06/10/1951  DATE OF ADMISSION:  04/04/2013  HISTORY OF PRESENT ILLNESS: Mr. Molner is a 64 year old white male who was previously healthy except for a few episodes of right upper quadrant pain not associated with nausea that lasted a few hours and resolved spontaneously over the last year. At 2:30 this morning, he was awoken with more severe right upper quadrant pain that has not remitted. He denies fever, chills, change in the color of his urine, stool, skin or eyes. This episode is not particularly associated with nausea either.   PAST MEDICAL HISTORY: No medical illnesses.   MEDICATIONS: Omeprazole.   ALLERGIES: None.   REVIEW OF SYSTEMS: Negative for 10 systems except as mentioned in the history of present illness above. Pertinent negatives include no fever, chills, vomiting, diarrhea, change in the color of his urine, stool, eyes or skin.   FAMILY HISTORY: Noncontributory.   SOCIAL HISTORY: The patient is married and lives with his wife. He drives a forklift. He does not smoke cigarettes and essentially never has. He does not drink alcohol.   PHYSICAL EXAMINATION: GENERAL: A pleasant, middle-aged white male lying comfortably in the Emergency Department stretcher. Height 6 feet 1 inch, weight 195 pounds, BMI 25.7.  VITAL SIGNS: Temperature 98.4, pulse 91, respirations 18, blood pressure 170/100, oxygen saturation 97% on room air at rest. Repeat blood pressure 159/92 and repeat following adequate analgesia 153/88.  HEENT: Pupils equally round and reactive to light. Extraocular movements intact. Sclerae are anicteric. Oropharynx is clear. Mucous membranes moist. Hearing intact to voice.  NECK: Supple with no tracheal deviation or jugular venous distention.  HEART: Regular rate and rhythm with no murmurs or rubs.  LUNGS: Clear to auscultation with normal respiratory effort bilaterally.  ABDOMEN: Soft, tender in the right upper  quadrant with no distention and no rebound or guarding.  EXTREMITIES: No edema with normal capillary refill bilaterally.  NEUROLOGIC: Cranial nerves II through XII, motor and sensation grossly intact.  PSYCHIATRIC: Alert and oriented x 4. Appropriate affect.   LABORATORY AND RADIOLOGICAL DATA:  White blood cell count 18,000, hemoglobin 17, hematocrit 48.5%. Liver function tests normal. Electrolytes normal. Urinalysis normal.   Ultrasound of the right upper quadrant reveals sludge as well as an impacted gallstone, gallbladder wall thickness to 5.8 mm plus pericholecystic fluid. The common bile duct is marginally dilated at 8.4 mm.   ASSESSMENT: Acute cholecystitis.   PLAN: Admit to the hospital for IV fluid hydration, analgesia, IV antibiotics, and laparoscopic cholecystectomy in the morning. The patient and his wife understand the assessment and plan and agree with it.   ____________________________ Consuela Mimes, MD wfm:cb D: 04/04/2013 21:01:14 ET T: 04/04/2013 21:14:02 ET JOB#: [id number]  cc: Consuela Mimes, MD, <Dictator> Consuela Mimes MD ELECTRONICALLY SIGNED 04/06/2013 7:28

## 2015-03-16 NOTE — Op Note (Signed)
PATIENT NAME:  Troy Reynolds, GOYTIA MR#:  [id number] DATE OF BIRTH:  March 19, 1951  DATE OF PROCEDURE:  08/23/2013  PREOPERATIVE DIAGNOSIS: Right cervical lymph node enlargement with history of lymphoma.   POSTOPERATIVE DIAGNOSIS: Right cervical lymph node enlargement with history of lymphoma.   ATTENDING SURGEON: Beverly Gust, MD.   ASSISTANT SURGEON: Dr. Richardson Landry.  OPERATION PERFORMED: Right modified radical neck dissection.  OPERATIVE FINDINGS: Approximately 6 x 8 cm mass adherent to and adjacent to the jugular vein and carotid artery on the right beneath the omohyoid muscle and deep to the sternocleidomastoid.   DESCRIPTION OF PROCEDURE: [place] was identified in the holding area, taken to the operating room and placed in the supine position. After general endotracheal anesthesia, the table was turned 90 degrees. The right neck was prepped and draped sterilely. The mass could easily be palpated in the inferior aspect of the neck deep to the sternocleidomastoid muscle. Incision line was marked overlying the mass. A local anesthetic of 1% lidocaine with 1:100,000 epinephrine was used to inject along the mass along the incision line. A total of 5 mL was used. Right neck was then prepped and draped sterilely. A 15 blade was used to incise down through the platysma muscle. Hemostasis was achieved using the Bovie cautery. The anterior border of sternocleidomastoid muscle was identified. The fascia separating this from the anterior compartment of the neck was gently divided using the Harmonic scalpel. As the sternocleidomastoid muscle was retracted posteriorly, the omohyoid muscle was identified and fell right over the large neck mass. Dissection proceeded medially and the mass was easily identifiable. The jugular vein was identified on the anterior surface of this. It was felt that to protect the jugular vein, dissection would need to proceed by dividing the mass from the jugular vein. Therefore, careful  dissection was created with a plane between the mass and the jugular vein. There were several small feeding vessels into the jugular vein. These were divided using the Harmonic scalpel and 2-0 suture ligature. This allowed the jugular vein be retracted anteriorly.   The mass was then dissected anteriorly as it approached the carotid artery. The vagus nerve was identified as it ran along the carotid artery. This was preserved throughout the procedure. There was a branch of the ansa cervicalis, which ran on the surface of the mass and was adherent to it. Stimulation of this did show stimulation of the anterior strap muscles. It was felt that because this ran almost directly through the mass, that could not be sacrificed; therefore, the nerve was divided using the Harmonic scalpel. The large mass was then dissected superiorly. The omohyoid was also adherent to it and prevented [place] from getting to the anterior portion. Therefore, the omohyoid muscle was divided in its midportion, retracted posteriorly and anteriorly. Dissection then proceeded over the superior aspect of this large mass with careful dissection. Any small blood vessels were divided using the Harmonic scalpel. The superior aspect was then freed. The inferior aspect, which was also adherent to the jugular vein, was separated gently away and the vein was retracted inferiorly. We then dissected around the circumference of the mass as it was adherent to the sternocleidomastoid muscle posteriorly. Again, there were multiple fibers and fibrous tissue, which were divided using the Harmonic scalpel.   With this, the dissection proceeded posteriorly up to the sternocleidomastoid muscle and the mass was removed in its entirety. Re-inspection of the jugular vein showed no evidence of bleeding. No evidence of traumatic injury to it. The  vagus nerve remained intact as did the carotid artery. The wound was then copiously irrigated with saline. Any small bleeding  points were cauterized using the microbipolar. The omohyoid muscle was reapproximated in its mid belly using 4-0 Vicryl. Surgicel was then placed in the vascular bed. A #10 TLS drain was brought out of the wound inferiorly. The platysmal layer was closed using interrupted 4-0 Vicryl. The subcutaneous tissues were closed using 4-0 Vicryl and the skin was closed using Dermabond. A Tegaderm was used to keep the drain in position. The patient was then returned to anesthesia where he was awakened in the operating room and taken to the recovery room in stable condition.   CULTURES: None.   SPECIMENS: Right 6 x 8 cm neck mass.   ESTIMATED BLOOD LOSS: Less than 20 mL.   ____________________________ Roena Malady, MD ctm:aw D: 08/23/2013 13:28:27 ET T: 08/23/2013 14:32:52 ET JOB#: [id number]  cc: Roena Malady, MD, <Dictator> Roena Malady MD ELECTRONICALLY SIGNED 09/06/2013 9:43

## 2015-03-17 NOTE — Op Note (Signed)
PATIENT NAME:  Troy Reynolds, Troy Reynolds MR#:  [id number] DATE OF BIRTH:  [birth year]  DATE OF PROCEDURE:  06/01/2014  PREOPERATIVE DIAGNOSIS: History of lymphoma and Port-A-Cath placement.   POSTOPERATIVE DIAGNOSIS: History of lymphoma and Port-A-Cath placement.   PROCEDURE PERFORMED: Port-A-Cath removal and excision of capsule.   SURGEON: Marlyce Huge, MD  ESTIMATED BLOOD LOSS: 5 mL.   COMPLICATIONS: None.   SPECIMEN: Port-A-Cath and capsule.   ANESTHESIA: MAC, local.   INDICATION FOR SURGERY: Mr. Hannen is a pleasant 64 year old male who I had recently placed a Port-A-Cath for a lymphoma. He is no longer using the port and would like it removed. He is brought to the operating room suite for removal of the Port-A-Cath.   DETAILS OF PROCEDURE: The patient was brought to the operating room suite. He was given IV sedation and then had a face mask placed. His left chest was prepped and draped in standard surgical fashion. A timeout was then performed correctly identifying the patient name, operative site and procedure to be performed. An incision was made through the previous incision. The port was withdrawn. The sutures which were securing the port were cut and removed. A pursestring was placed in the entrance of the port catheter into the subcutaneous tissue. The port was removed and this suture was tied. The capsule was then excised with Bovie electrocautery. The wound was irrigated and made hemostatic. It was then closed in 2 layers with deep dermal interrupted 3-0 Vicryl and a running 4-0 Monocryl subcuticular. Dermabond was then used to complete the dressing. The patient was then awoken, extubated and brought to the postanesthesia care unit. There were no immediate complications. Needle, sponge, and instrument counts were correct at the end of the procedure.   ____________________________ Glena Norfolk. Tuff Clabo, MD cal:sb D: 06/01/2014 09:38:29 ET T: 06/01/2014 10:01:07  ET JOB#: [id number]  cc: Harrell Gave A. Angeligue Bowne, MD, <Dictator> Floyde Parkins MD ELECTRONICALLY SIGNED 06/04/2014 18:14

## 2015-03-17 NOTE — Consult Note (Signed)
Reason for Visit: This 64 year old Male patient presents to the clinic for initial evaluation of  malignant lymphoma .   Referred by Dr. Oliva Bustard.  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 CHOP chemotherapy x4 with complete response by PET CT criteria now for involved field radiation  Pathology Report pathology report reviewed   Imaging Report PET/CT scans in serial fashion reviewed   Referral Report clinical notes reviewed   Planned Treatment Regimen involved field radiation   HPI   patient is a 64 year old male who presented with a nodular densities right supraclavicular fossa. Initial needle biopsy was positive for malignant lymphoma B-cell type. Bone marrow was negative. PET CT demonstrated rightsupraclavicular  hypermetabolic mass. Small area in the cervical spine was also slightly hypermetabolic with no CT correlation. No other areas of disease were noted. Surgical biopsy by ENT showed B-cell follicular from with 24% diffuse component CD20 positive also CD10 positive. Patient was started R. CHOP chemotherapy completed treatments December 2014 with repeat PET/CT showing complete response. He is seen today for consideration of involved field radiation. He still has occasional night sweats. No fever or chills or weight loss. By mouth intake is good bowels tend towards slight frequency he is also had some episodes of rectal incontinencealthough does have a history of Crohn's disease.. Patient is otherwise without complaint.  Past Hx:    Chron's:    Kidney Stones:    Lymphoma:    GERD - Esophageal Reflux:    Colonoscopy:    Cholecystectomy:    Right modified radical neck dissection: Sep 2014  Past, Family and Social History:  Past Medical History positive   Gastrointestinal GERD; Crohn's disease   Genitourinary kidney stones   Past Surgical History  cholecystectomy; right modified neck dissection   Family History positive   Family History Comments father with lymphoma exact type not known and with GBM   Social History noncontributory   Additional Past Medical and Surgical History accompanied by his wife today   Allergies:   No Known Allergies:   Home Meds:  Home Medications: Medication Instructions Status  acetaminophen-HYDROcodone 325 mg-5 mg oral tablet 1 tab(s) orally every 6 hours, As needed, pain Active  pantoprazole 40 mg oral delayed release tablet 1 tab(s) orally once a day (in the morning) Active  Centrum Antioxidant Multiple Vitamins and Minerals oral tablet 1 tab(s) orally once a day Active  ZyrTEC 10 mg oral tablet 1 tab(s) orally once a day Active  hydrochlorothiazide-losartan 12.5 mg-50 mg oral tablet 1 tab(s) orally once a day Active  Robitussin AC 5 milliliter(s) orally 4 times a day, As Needed Active   Review of Systems:  General negative   Performance Status (ECOG) 0   Skin negative   Breast negative   Ophthalmologic negative   ENMT negative   Respiratory and Thorax negative   Cardiovascular negative   Gastrointestinal see HPI   Genitourinary negative   Musculoskeletal negative   Neurological negative   Psychiatric negative   Hematology/Lymphatics negative   Endocrine negative   Allergic/Immunologic negative   Review of Systems   review of systems obtained from nurse's notes  Nursing Notes:  Nursing Vital Signs and Chemo Nursing Nursing Notes: *CC Vital Signs Flowsheet:   14-Jan-15 10:09  Temp Temperature 97.1  Pulse Pulse 81  SBP SBP 130  DBP DBP 87  Pain Scale (0-10)  0  Current Weight (kg) (  kg) 92.2  Height (cm) centimeters 185.4  BSA (m2) 2.1   Physical Exam:  General/Skin/HEENT:  General normal   Skin normal   Eyes normal   ENMT normal   Head and Neck normal   Additional PE well-developed male in NAD. Oral cavity is clear no oral mucosal lesions are  identified. Neck is clear without evidence of some digastric cervical or supraclavicular adenopathy. Right neck incision is well-healed. Lungs are clear to A&P cardiac examination shows regular rate and rhythm. No peripheral adenopathy is identified.   Breasts/Resp/CV/GI/GU:  Respiratory and Thorax normal   Cardiovascular normal   Gastrointestinal normal   Genitourinary normal   MS/Neuro/Psych/Lymph:  Musculoskeletal normal   Neurological normal   Lymphatics normal   Other Results:  Radiology Results: LabUnknown:    15-Sep-14 10:57, PET/CT Scan Lymphoma Initial Staging  PACS Image     29-Dec-14 15:19, PET/CT Scan Lymphoma Restaging  PACS Image   Nuclear Med:    15-Sep-14 10:57, PET/CT Scan Lymphoma Initial Staging  PET/CT Scan Lymphoma Initial Staging   REASON FOR EXAM:    lymphoma  COMMENTS:       PROCEDURE: PET - PET/CT INIT STAGING LYMPHOMA  - Aug 08 2013 10:57AM     RESULT: The patient is undergoing initial staging of lymphoma. The   patient's fasting blood glucose level was 89 mg/dL. The patient received   11.98 mCi of F-18 labeled FDG at 9:17 a.m. with scanning beginning at   10:14 a.m. A noncontrast CT scan was performed at the same sitting for   coregistration attenuation correction.    There is a large amount of increased uptake deep tothe right   sternocleidomastoid muscle at the base of the neck secondary to an   enlarged lymph node here. This exhibits maximal SUV of 10 with a mean of   6.1, and the lymph node measures approximately 3 x 3.5 cm. I do not see     abnormal uptake elsewhere within the soft tissues of the neck. There is a   focus of increased uptake centered around the C4-C5 disc and adjacent   vertebral bodies but there is no abnormality on the CT images. The   maximal SUV here is 3.8 with a mean of 1.8.    Within the thorax there is a focus of abnormal uptake in the posterior   inferior aspect of the left lower lobe. This corresponds to an  area of   parenchymal density. It exhibits maximal SUV a 4.3 with a mean of 2.4. No   abnormal uptake in the mediastinumor hilar regions is demonstrated.    Below the hemidiaphragms there is no abnormal uptake within the liver or   adrenal glands or spleen. Normal expected activity within the urinary   tracts and within bowel is demonstrated. I do not see abnormal uptake   within the skeleton.  On the CT images there is no bulky anterior or posterior cervical   lymphadenopathy. A prominent right supraclavicular lymph node has   previously been described. Within the thorax no lymphadenopathy is   evident. There is no pleural effusion. There is an infiltrate-like area   that exhibits increased uptake in the left lower lobe as described. The   cardiac chambers are top normal in size. The caliber of the thoracic   aorta is normal. There is a hiatal hernia.    Within the abdomen the gallbladder is surgically absent. The liver and   spleen and adrenal glands appear normal. There  are nonobstructing stones   in both kidneys. The pancreas and stomach exhibit no acute abnormalities.   The unopacified loops of small and large bowel appear normal. The   prostate gland is enlarged and produces a mild impression upon the   urinary bladder base. There is no intra-abdominal nor pelvic nor inguinal   lymphadenopathy. The cervical, thoracic, and lumbar vertebral bodies are     preserved in height.    IMPRESSION:   1. There is abnormal uptake of the radiopharmaceutical within an enlarged   right supraclavicular lymph node. There is also abnormal uptake in the   parenchyma of the left lower lobe posteriorly. These findings likely   reflect active lymphoma though the lung findings could certainly be   related to focal pneumonia.  2. There is no bulky intrathoracic or intra-abdominal or pelvic   lymphadenopathy.  3. There is no splenomegaly.  4. There are nonobstructing stones in both kidneys.  5.  There is nonspecific fairly low level increased uptake at   approximately the C3 and C4 cervical levels without CT abnormality.   Dictation Site: 2        Verified By: DAVID A. Martinique, M.D., MD    29-Dec-14 15:19, PET/CT Scan Lymphoma Restaging  PET/CT Scan Lymphoma Restaging   REASON FOR EXAM:    Lymphoma restage  COMMENTS:       PROCEDURE: PET - PET/CT RESTAGING LYMPHOMA  - Nov 21 2013  3:19PM     CLINICAL DATA:  Subsequent treatment strategy for lymphoma. Patient  underwent right cervical lymph node resection 3 months ago and  chemotherapy, most recently 2 weeks ago.    EXAM:  NUCLEAR MEDICINE PET SKULL BASE TO THIGH    FASTING BLOOD GLUCOSE:  Value: 86mg /dl    TECHNIQUE:  11.96 mCi F-18 FDG was injected intravenously. CT data was obtained  and used for attenuation correction and anatomic localization only.  (This was not acquired as a diagnostic CT examination.) Additional  exam technical data entered on technologist worksheet.    COMPARISON:  PET CT 08/08/2013.  Neck CT 07/27/2013.    FINDINGS:  NECK    There is no residual right supraclavicular hypermetabolic  adenopathy.No hypermetabolic lymph nodes are demonstrated elsewhere  within the neck. There are no lesions of the pharyngeal mucosal  space. Some mucosal thickening and scattered air-fluid levels are  noted throughout the paranasal sinuses.  CHEST    There are no hypermetabolic mediastinal, hilar or axillary lymph  nodes. There is no hypermetabolic pulmonary activity. The previously  demonstrated left lower lobe airspace disease has largely cleared  with mild residual scarring. There is a small hiatal hernia with  associated low-level hypermetabolic activity (SUV max 5.4).    ABDOMEN/PELVIS    There is no hypermetabolic activity within the liver, adrenal  glands, spleen or pancreas. There is no hypermetabolic nodal  activity. Soft tissue stranding within the base of the mesentery has  improved and is  without residual abnormal metabolic activity. There  is a new 6 mm obstructing calculus at the left ureterovesical  junction with associatedleft-sided hydronephrosis and delayed  excretion of radiopharmaceutical. Bilateral renal calculi are again  noted. There are stable low-density hepatic lesions and mild biliary  dilatation status post cholecystectomy. Small inguinal hernias  containing only fat are stable.    SKELETON    Newly increased patchy marrow activity is likely reactive. No  focally suspicious lesions are identified.     IMPRESSION:  1. Interval resection of hypermetabolic  right supraclavicular lymph  node. No residual orrecurrent hypermetabolic nodal activity  identified within the neck, chest, abdomen or pelvis.  2. Near-complete resolution of left lower lobe airspace disease  without residual hypermetabolic activity.  3. Obstructing 6 mm calculus at the left ureterovesical junction.  4. Hypermetabolic activity within a hiatal hernia, likely  inflammatory.  5. Patchy bone marrow activity, likely reactive to interval  chemotherapy.      Electronically Signed    By: Camie Patience M.D.    On: 11/21/2013 15:54         Verified By: Vivia Ewing, M.D.,   Relevent Results:   Relevant Scans and Labs serial PET/CT scans are reviewed   Assessment and Plan: Impression:   64 year old male with stage IA follicular B-cell lymphoma status post R. CHOP with complete response by PET CT criteria now for involved field radiation. Plan:   at this time I have recommended going ahead with involved field radiation therapy treated supraclavicular fossa as and neck bilaterally with IMRT treatment to spare spinal cord salivary glands and oral cavity. Would plan on delivering 3000 cGy over 3 weeks. Risks and benefits of treatment including possible dryness of the mouth oral mucositis dysphasia skin reaction alteration of blood counts all were explained in detail to the patient. I set  the patient up for CT simulation next week. Case was discussed personally with Dr. Oliva Bustard.  I would like to take this opportunity to thank you for allowing me to continue to participate in this patient's care.  CC Referral:  cc: Dr. Emily Filbert   Electronic Signatures: Baruch Gouty, Roda Shutters (MD)  (Signed 14-Jan-15 12:32)  Authored: HPI, Diagnosis, Past Hx, PFSH, Allergies, Home Meds, ROS, Nursing Notes, Physical Exam, Other Results, Relevent Results, Encounter Assessment and Plan, CC Referring Physician   Last Updated: 14-Jan-15 12:32 by Armstead Peaks (MD)

## 2015-07-06 ENCOUNTER — Other Ambulatory Visit: Payer: Self-pay | Admitting: *Deleted

## 2015-07-06 DIAGNOSIS — C8590 Non-Hodgkin lymphoma, unspecified, unspecified site: Secondary | ICD-10-CM

## 2015-07-10 ENCOUNTER — Ambulatory Visit: Admission: RE | Admit: 2015-07-10 | Payer: 59 | Source: Ambulatory Visit

## 2015-07-11 ENCOUNTER — Other Ambulatory Visit: Payer: Self-pay | Admitting: *Deleted

## 2015-07-11 DIAGNOSIS — C8590 Non-Hodgkin lymphoma, unspecified, unspecified site: Secondary | ICD-10-CM

## 2015-07-12 ENCOUNTER — Encounter
Admission: RE | Admit: 2015-07-12 | Discharge: 2015-07-12 | Disposition: A | Discharge status: Home / Self Care | Payer: 59 | Source: Ambulatory Visit | Attending: Oncology | Admitting: Oncology

## 2015-07-12 ENCOUNTER — Ambulatory Visit: Payer: Self-pay | Admitting: Oncology

## 2015-07-12 ENCOUNTER — Other Ambulatory Visit: Payer: Self-pay

## 2015-07-12 ENCOUNTER — Other Ambulatory Visit: Payer: Self-pay | Admitting: Oncology

## 2015-07-12 DIAGNOSIS — R590 Localized enlarged lymph nodes: Secondary | ICD-10-CM | POA: Diagnosis not present

## 2015-07-12 DIAGNOSIS — K449 Diaphragmatic hernia without obstruction or gangrene: Secondary | ICD-10-CM | POA: Diagnosis not present

## 2015-07-12 DIAGNOSIS — C8590 Non-Hodgkin lymphoma, unspecified, unspecified site: Secondary | ICD-10-CM

## 2015-07-12 DIAGNOSIS — N200 Calculus of kidney: Secondary | ICD-10-CM | POA: Insufficient documentation

## 2015-07-12 DIAGNOSIS — C8299 Follicular lymphoma, unspecified, extranodal and solid organ sites: Secondary | ICD-10-CM

## 2015-07-12 DIAGNOSIS — Z08 Encounter for follow-up examination after completed treatment for malignant neoplasm: Secondary | ICD-10-CM | POA: Insufficient documentation

## 2015-07-12 DIAGNOSIS — J329 Chronic sinusitis, unspecified: Secondary | ICD-10-CM | POA: Diagnosis not present

## 2015-07-12 DIAGNOSIS — N201 Calculus of ureter: Secondary | ICD-10-CM | POA: Diagnosis not present

## 2015-07-12 LAB — GLUCOSE, CAPILLARY: GLUCOSE-CAPILLARY: 91 mg/dL (ref 65–99)

## 2015-07-12 MED ORDER — FLUDEOXYGLUCOSE F - 18 (FDG) INJECTION
12.6300 | Freq: Once | INTRAVENOUS | Status: DC | PRN
  Administered 2015-07-12: 12.63 via INTRAVENOUS
  Filled 2015-07-12: qty 12.63

## 2015-07-13 DIAGNOSIS — M778 Other enthesopathies, not elsewhere classified: Secondary | ICD-10-CM | POA: Insufficient documentation

## 2015-07-13 DIAGNOSIS — M7581 Other shoulder lesions, right shoulder: Secondary | ICD-10-CM

## 2015-07-24 ENCOUNTER — Inpatient Hospital Stay: Payer: 59 | Attending: Oncology

## 2015-07-24 ENCOUNTER — Encounter: Payer: Self-pay | Admitting: Oncology

## 2015-07-24 ENCOUNTER — Inpatient Hospital Stay (HOSPITAL_BASED_OUTPATIENT_CLINIC_OR_DEPARTMENT_OTHER): Payer: 59 | Admitting: Oncology

## 2015-07-24 VITALS — BP 128/86 | HR 75 | Temp 97.0°F | Wt 208.4 lb

## 2015-07-24 DIAGNOSIS — Z923 Personal history of irradiation: Secondary | ICD-10-CM | POA: Diagnosis not present

## 2015-07-24 DIAGNOSIS — Z9221 Personal history of antineoplastic chemotherapy: Secondary | ICD-10-CM

## 2015-07-24 DIAGNOSIS — Z8572 Personal history of non-Hodgkin lymphomas: Secondary | ICD-10-CM

## 2015-07-24 DIAGNOSIS — C8590 Non-Hodgkin lymphoma, unspecified, unspecified site: Secondary | ICD-10-CM

## 2015-07-24 DIAGNOSIS — Z79899 Other long term (current) drug therapy: Secondary | ICD-10-CM | POA: Insufficient documentation

## 2015-07-24 LAB — COMPREHENSIVE METABOLIC PANEL
ALBUMIN: 4.3 g/dL (ref 3.5–5.0)
ALT: 23 U/L (ref 17–63)
ANION GAP: 6 (ref 5–15)
AST: 26 U/L (ref 15–41)
Alkaline Phosphatase: 74 U/L (ref 38–126)
BILIRUBIN TOTAL: 0.4 mg/dL (ref 0.3–1.2)
BUN: 17 mg/dL (ref 6–20)
CO2: 28 mmol/L (ref 22–32)
Calcium: 8.7 mg/dL — ABNORMAL LOW (ref 8.9–10.3)
Chloride: 106 mmol/L (ref 101–111)
Creatinine, Ser: 0.85 mg/dL (ref 0.61–1.24)
GFR calc Af Amer: >60 mL/min (ref >60)
GFR calc non Af Amer: >60 mL/min (ref >60)
GLUCOSE: 105 mg/dL — AB (ref 65–99)
POTASSIUM: 3.7 mmol/L (ref 3.5–5.1)
Sodium: 140 mmol/L (ref 135–145)
TOTAL PROTEIN: 6.7 g/dL (ref 6.5–8.1)

## 2015-07-24 LAB — CBC WITH DIFFERENTIAL/PLATELET
BASOS PCT: 1 %
Basophils Absolute: 0 10*3/uL (ref 0–0.1)
EOS ABS: 0.3 10*3/uL (ref 0–0.7)
Eosinophils Relative: 6 %
HEMATOCRIT: 43 % (ref 40.0–52.0)
Hemoglobin: 15 g/dL (ref 13.0–18.0)
Lymphocytes Relative: 23 %
Lymphs Abs: 1.1 10*3/uL (ref 1.0–3.6)
MCH: 29.1 pg (ref 26.0–34.0)
MCHC: 34.8 g/dL (ref 32.0–36.0)
MCV: 83.7 fL (ref 80.0–100.0)
MONO ABS: 0.5 10*3/uL (ref 0.2–1.0)
MONOS PCT: 10 %
NEUTROS ABS: 3 10*3/uL (ref 1.4–6.5)
Neutrophils Relative %: 60 %
Platelets: 201 10*3/uL (ref 150–440)
RBC: 5.14 MIL/uL (ref 4.40–5.90)
RDW: 13.5 % (ref 11.5–14.5)
WBC: 5 10*3/uL (ref 3.8–10.6)

## 2015-07-24 LAB — LACTATE DEHYDROGENASE: LDH: 180 U/L (ref 98–192)

## 2015-07-24 LAB — TSH: TSH: 3.784 u[IU]/mL (ref 0.350–4.500)

## 2015-07-24 MED ORDER — LEVOFLOXACIN 500 MG PO TABS: 500.0000 mg | ORAL_TABLET | Freq: Every day | ORAL | Status: DC

## 2015-07-24 NOTE — Progress Notes (Signed)
Patient does not have living will.  Never smoked. 

## 2015-07-25 LAB — T4: T4, Total: 8.2 ug/dL (ref 4.5–12.0)

## 2015-07-28 ENCOUNTER — Encounter: Payer: Self-pay | Admitting: Oncology

## 2015-07-28 NOTE — Progress Notes (Signed)
[place] @ [facility] Telephone:([phone]) [phone]  Fax:([phone]) [phone]     Troy Reynolds OB: [birth year]  MR#: [id number]  VOP#:[id number]  Patient Care Team: Rusty Aus, MD as PCP - General (Internal Medicine)  1.4 cm mass in the right side of the neck not examinedeedle biopsy was positive to for malignant cells B-cell lymphoma of fever probably due to lymphoma CD10 positive August of 2014 2.bone marrow aspiration and biopsy is negative for any lymphoma involvement (September, 2014) 3.  PET scan has been reviewed shows increased uptake in the neck as well as in the lungno other evidence of FDG activity lymphadenopathy  4.follicular lymphith 63% diffuse B cell component , biopsy from  neck.  (October, 2014) Stage IA disease 5.patient was started on chemotherapy with  RCHOP on September 07, 2013 6patient finished chemotherapy R. CHOP x4 in December of 2014 7. Involved field radiation therapy in January of 2015 INTERVAL HISTORY:  64 year old gentleman came today further follow-up.  Patient had recurrent sinus infection treated with multiple antibiotic course.  Recently had a PET scan done which shows bilateral symmetrical lymphadenopathy since nasopharyngeal uptake of unspecified nature. No chills.  No fever.  No nausea.  No vomiting.  No diarrhea.  REVIEW OF SYSTEMS:   GENERAL:  Feels good.  Active.  No fevers, sweats or weight loss. PERFORMANCE STATUS (ECOG):  0 HEENT:  No visual changes, runny nose, sore throat, mouth sores or tenderness. Had recurrent sinus infection recently from the patient is gradually recovering Lungs: No shortness of breath or cough.  No hemoptysis. Cardiac:  No chest pain, palpitations, orthopnea, or PND. GI:  No nausea, vomiting, diarrhea, constipation, melena or hematochezia. GU:  No urgency, frequency, dysuria, or hematuria. Musculoskeletal:  No back pain.  No joint pain.  No muscle tenderness. Extremities:  No pain or swelling. Skin:  No rashes or skin  changes. Neuro:  No headache, numbness or weakness, balance or coordination issues. Endocrine:  No diabetes, thyroid issues, hot flashes or night sweats. Psych:  No mood changes, depression or anxiety. Pain:  No focal pain. Review of systems:  All other systems reviewed and found to be negative. As per HPI. Otherwise, a complete review of systems is negatve.    Chron's:    Kidney Stones:    Lymphoma:    GERD - Esophageal Reflux:    Port Placement:    Colonoscopy:    Cholecystectomy:    Right modified radical neck dissection: Sep 2014  Preventive Screening:  Has patient had any of the following test? Colonscopy  Prostate Exam (1)   Last Colonoscopy: 2007-DrRoetta Sessions)   Last Prostate Exam: 2013-Dr. 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  Information was given .  Available resources had been discussed.  We will follow-up on subsequent appointments regarding this issue HEALTH MAINTENANCE: Social History  Substance Use Topics  . Smoking status: Never Smoker   . Smokeless tobacco: None  . Alcohol Use: None      Not on File  Current Outpatient Prescriptions  Medication Sig Dispense Refill  . Cetirizine HCl 10 MG CAPS Take by mouth.    . losartan-hydrochlorothiazide (HYZAAR) 50-12.5 MG  per tablet TAKE 1 TABLET ONCE A DAY    . pantoprazole (PROTONIX) 40 MG tablet Take by mouth.    . levofloxacin (LEVAQUIN) 500 MG tablet Take 1 tablet (500 mg total) by mouth daily. 7 tablet 0  . ranitidine (ZANTAC) 150 MG tablet Take by mouth.     No current facility-administered medications for this visit.     OBJECTIVE:  Filed Vitals:   07/24/15 1627  BP: 128/86  Pulse: 75  Temp: 97 F (36.1 C)     There is no height on file to calculate BMI.    ECOG FS:0 - Asymptomatic  PHYSICAL EXAM: GENERAL:  Well developed, well nourished, sitting comfortably in the exam room in no acute distress. MENTAL STATUS:  Alert and oriented to person, place and time. HEAD:  Normocephalic, atraumatic, face symmetric, no Cushingoid features.  ENT:  Oropharynx clear without lesion.  Tongue normal. Mucous membranes moist.  RESPIRATORY:  Clear to auscultation without rales, wheezes or rhonchi. CARDIOVASCULAR:  Regular rate and rhythm without murmur, rub or gallop. BREAST:  Right breast without masses, skin changes or nipple discharge.  Left breast without masses, skin changes or nipple discharge. ABDOMEN:  Soft, non-tender, with active bowel sounds, and no hepatosplenomegaly.  No masses. BACK:  No CVA tenderness.  No tenderness on percussion of the back or rib cage. SKIN:  No rashes, ulcers or lesions. EXTREMITIES: No edema, no skin discoloration or tenderness.  No palpable cords. LYMPH NODES: No palpable cervical, supraclavicular, axillary or inguinal adenopathy  There are no palpable lymphadenopathy E1 on deep palpation NEUROLOGICAL: Unremarkable. PSYCH:  Appropriate.   LAB RESULTS:  CBC Latest Ref Rng 07/24/2015 03/12/2015  WBC 3.8 - 10.6 K/uL 5.0 4.2  Hemoglobin 13.0 - 18.0 g/dL 15.0 15.5  Hematocrit 40.0 - 52.0 % 43.0 46.0  Platelets 150 - 440 K/uL 201 193    Appointment on 07/24/2015  Component Date Value Ref Range Status  . WBC 07/24/2015 5.0  3.8 - 10.6 K/uL Final  . RBC 07/24/2015 5.14  4.40 - 5.90 MIL/uL Final  . Hemoglobin 07/24/2015 15.0  13.0 - 18.0 g/dL Final  . HCT 07/24/2015 43.0  40.0 - 52.0 % Final  . MCV 07/24/2015 83.7  80.0 - 100.0 fL Final  . MCH 07/24/2015 29.1  26.0 - 34.0 pg Final  . MCHC 07/24/2015 34.8  32.0 - 36.0 g/dL Final  . RDW 07/24/2015 13.5  11.5 - 14.5 % Final   . Platelets 07/24/2015 201  150 - 440 K/uL Final  . Neutrophils Relative % 07/24/2015 60   Final  . Neutro Abs 07/24/2015 3.0  1.4 - 6.5 K/uL Final  . Lymphocytes Relative 07/24/2015 23   Final  . Lymphs Abs 07/24/2015 1.1  1.0 - 3.6 K/uL Final  . Monocytes Relative 07/24/2015 10   Final  . Monocytes Absolute 07/24/2015 0.5  0.2 - 1.0 K/uL Final  . Eosinophils Relative 07/24/2015 6   Final  . Eosinophils Absolute 07/24/2015 0.3  0 - 0.7 K/uL Final  . Basophils Relative 07/24/2015 1   Final  . Basophils Absolute 07/24/2015 0.0  0 - 0.1 K/uL Final  . Sodium 07/24/2015 140  135 - 145 mmol/L Final  . Potassium 07/24/2015 3.7  3.5 - 5.1 mmol/L Final  . Chloride 07/24/2015 106  101 - 111 mmol/L Final  . CO2 07/24/2015 28  22 - 32 mmol/L Final  . Glucose, Bld 07/24/2015 105* 65 - 99 mg/dL Final  . BUN 07/24/2015 17  6 -  20 mg/dL Final  . Creatinine, Ser 07/24/2015 0.85  0.61 - 1.24 mg/dL Final  . Calcium 07/24/2015 8.7* 8.9 - 10.3 mg/dL Final  . Total Protein 07/24/2015 6.7  6.5 - 8.1 g/dL Final  . Albumin 07/24/2015 4.3  3.5 - 5.0 g/dL Final  . AST 07/24/2015 26  15 - 41 U/L Final  . ALT 07/24/2015 23  17 - 63 U/L Final  . Alkaline Phosphatase 07/24/2015 74  38 - 126 U/L Final  . Total Bilirubin 07/24/2015 0.4  0.3 - 1.2 mg/dL Final  . GFR calc non Af Amer 07/24/2015 >60  >60 mL/min Final  . GFR calc Af Amer 07/24/2015 >60  >60 mL/min Final   Comment: (NOTE) The eGFR has been calculated using the CKD EPI equation. This calculation has not been validated in all clinical situations. eGFR's persistently <60 mL/min signify possible Chronic Kidney Disease.   . Anion gap 07/24/2015 6  5 - 15 Final  . LDH 07/24/2015 180  98 - 192 U/L Final  . T4, Total 07/24/2015 8.2  4.5 - 12.0 ug/dL Final   Comment: (NOTE) Performed At: [facility] [place], [place] [id number] Lindon Romp MD OE:[id number]   . 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  EXAM: NUCLEAR MEDICINE PET SKULL BASE TO THIGH  TECHNIQUE: 12.6 mCi F-18 FDG was injected intravenously. Full-ring PET imaging was performed from the skull base to thigh after the radiotracer. CT data was obtained and used for attenuation correction and anatomic localization.  FASTING BLOOD GLUCOSE:  Value: 91 mg/dl  COMPARISON:  07/10/2014  FINDINGS: NECK  New nasopharyngeal symmetric hypermetabolism without soft tissue mass. This measures a S.U.V. max of 9.3 on image 25.  Similarly, symmetric bilateral palatini tonsil hypermetabolism is without dominant CT correlate and measures a S.U.V. max of 14.1.  New bilateral cervical hypermetabolic adenopathy. Left level 2 node measures 11 mm and a S.U.V. max of 5.2.  A right level 2 node measures 8 mm and a S.U.V. max of 4.7.  Right level 3-4 nodes, including a right-sided node which measures 8 mm and a S.U.V. max of 2.7 on image 66.  CHEST  No areas of abnormal hypermetabolism.  ABDOMEN/PELVIS  No areas of abnormal hypermetabolism.  SKELETON  No abnormal marrow activity.  CT IMAGES PERFORMED FOR ATTENUATION CORRECTION  Left maxillary sinus fluid level. Tortuous thoracic aorta. Borderline cardiomegaly, without pericardial effusion. Small to moderate hiatal hernia. No mediastinal, hilar, or axillary adenopathy. Low-density left renal lesions are likely cysts. Punctate left renal collecting system calculus. Resolution of previously described hydronephrosis and passage of previously described right ureterovesicular junction calculus. Cholecystectomy. Stable small retroperitoneal nodes. Chronic interstitial thickening within the small bowel mesenteriyc could relate to treated lymphoma. Mild prostatomegaly. Bilateral fat containing inguinal hernias.  IMPRESSION: 1. Development of  hypermetabolic cervical lymph nodes, consistent with disease recurrence. 2. No extracervical disease identified. 3. Nasopharyngeal and palatine tonsil hypermetabolism are favored to be physiologic. Recommend attention on follow-up. 4. Left nephrolithiasis. Interval passage of right ureterovesicular junction calculus and resolution of mild right-sided hydronephrosis. 5. Hiatal hernia. 6. Sinus disease.   Electronically Signed   By: Abigail Miyamoto M.D.   On: 07/12/2015 15:59    ASSESSMENT: History of follicular lymphoma with large component of  large cell component.  Stage IA treated with R CHOP chemotherapy and radiation therapy Recent PET scan shows nasopharyngeal uptake of unspecified nature and bilateral symmetrical cervical lymphadenopathy Most likely we are dealing with infection rather than recurrent disease I do not feel any lymph node patient remains asymptomatic Case was discussed in tumor conference biopsy is not possible but will get ENT evaluation Possibility of repeating PET scan in 6 months   MEDICAL DECISION MAKING:  PET scan has been reviewed Case was discussed in tumor conference ENT evaluation would be considered Observation and repeating PET scan may be appropriate approach unless ENT physicians find a lymph node to be biopsied or any other abnormality in nasopharyngeal area  Patient expressed understanding and was in agreement with this plan. He also understands that He can call clinic at any time with any questions, concerns, or complaints.    No matching staging information was found for the patient.  Forest Gleason, MD   07/28/2015 10:44 AM

## 2015-07-31 ENCOUNTER — Telehealth: Payer: Self-pay | Admitting: *Deleted

## 2015-07-31 NOTE — Telephone Encounter (Signed)
-----   Message from Forest Gleason, MD sent at 07/28/2015 10:51 AM EDT ----- Regarding: ENT evaluation P may help to make an appointment for ENT evaluation.  I am not sure which ENT physician biopsied cervical lymph node Same ENT physician is to evaluate him for abnormal PET scan.

## 2015-07-31 NOTE — Telephone Encounter (Signed)
Called patient's home and left message with Bethena Roys that patient has been referred to ENT for further evaluation.  Verbalized understanding and will let patient know to expect a call regarding that appointment.

## 2015-09-04 ENCOUNTER — Ambulatory Visit: Payer: 59 | Admitting: Oncology

## 2015-09-04 ENCOUNTER — Other Ambulatory Visit: Payer: 59

## 2015-09-10 ENCOUNTER — Ambulatory Visit: Payer: 59 | Admitting: Oncology

## 2015-09-10 ENCOUNTER — Other Ambulatory Visit: Payer: 59

## 2015-09-12 ENCOUNTER — Inpatient Hospital Stay: Payer: 59 | Attending: Oncology

## 2015-09-12 ENCOUNTER — Encounter: Payer: Self-pay | Admitting: Oncology

## 2015-09-12 ENCOUNTER — Inpatient Hospital Stay (HOSPITAL_BASED_OUTPATIENT_CLINIC_OR_DEPARTMENT_OTHER): Payer: 59 | Admitting: Oncology

## 2015-09-12 VITALS — BP 129/86 | HR 70 | Temp 97.8°F | Ht 73.0 in | Wt 210.8 lb

## 2015-09-12 DIAGNOSIS — Z8572 Personal history of non-Hodgkin lymphomas: Secondary | ICD-10-CM

## 2015-09-12 DIAGNOSIS — Z9221 Personal history of antineoplastic chemotherapy: Secondary | ICD-10-CM | POA: Diagnosis not present

## 2015-09-12 DIAGNOSIS — Z923 Personal history of irradiation: Secondary | ICD-10-CM | POA: Diagnosis not present

## 2015-09-12 DIAGNOSIS — C8510 Unspecified B-cell lymphoma, unspecified site: Secondary | ICD-10-CM

## 2015-09-12 DIAGNOSIS — Z79899 Other long term (current) drug therapy: Secondary | ICD-10-CM

## 2015-09-12 DIAGNOSIS — C8590 Non-Hodgkin lymphoma, unspecified, unspecified site: Secondary | ICD-10-CM

## 2015-09-12 LAB — CBC WITH DIFFERENTIAL/PLATELET
BASOS PCT: 1 %
Basophils Absolute: 0 10*3/uL (ref 0–0.1)
Eosinophils Absolute: 0.2 10*3/uL (ref 0–0.7)
Eosinophils Relative: 4 %
HEMATOCRIT: 46.9 % (ref 40.0–52.0)
Hemoglobin: 15.8 g/dL (ref 13.0–18.0)
Lymphocytes Relative: 25 %
Lymphs Abs: 1.3 10*3/uL (ref 1.0–3.6)
MCH: 28.8 pg (ref 26.0–34.0)
MCHC: 33.8 g/dL (ref 32.0–36.0)
MCV: 85.3 fL (ref 80.0–100.0)
MONO ABS: 0.6 10*3/uL (ref 0.2–1.0)
MONOS PCT: 12 %
NEUTROS ABS: 3.1 10*3/uL (ref 1.4–6.5)
Neutrophils Relative %: 58 %
Platelets: 214 10*3/uL (ref 150–440)
RBC: 5.49 MIL/uL (ref 4.40–5.90)
RDW: 13.9 % (ref 11.5–14.5)
WBC: 5.2 10*3/uL (ref 3.8–10.6)

## 2015-09-12 LAB — COMPREHENSIVE METABOLIC PANEL
ALBUMIN: 4.3 g/dL (ref 3.5–5.0)
ALT: 24 U/L (ref 17–63)
ANION GAP: 5 (ref 5–15)
AST: 24 U/L (ref 15–41)
Alkaline Phosphatase: 72 U/L (ref 38–126)
BILIRUBIN TOTAL: 0.7 mg/dL (ref 0.3–1.2)
BUN: 17 mg/dL (ref 6–20)
CO2: 27 mmol/L (ref 22–32)
Calcium: 8 mg/dL — ABNORMAL LOW (ref 8.9–10.3)
Chloride: 102 mmol/L (ref 101–111)
Creatinine, Ser: 1.07 mg/dL (ref 0.61–1.24)
Glucose, Bld: 103 mg/dL — ABNORMAL HIGH (ref 65–99)
POTASSIUM: 3.8 mmol/L (ref 3.5–5.1)
Sodium: 134 mmol/L — ABNORMAL LOW (ref 135–145)
TOTAL PROTEIN: 7.2 g/dL (ref 6.5–8.1)

## 2015-09-12 LAB — LACTATE DEHYDROGENASE: LDH: 151 U/L (ref 98–192)

## 2015-09-12 NOTE — Progress Notes (Signed)
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  (October, 2014) Stage IA disease 5.patient was started on chemotherapy with  RCHOP on September 07, 2013 6patient finished chemotherapy R. CHOP x4 in December of 2014 7. Involved field radiation therapy in January of 2015 INTERVAL HISTORY:  64 year old gentleman came today further follow-up.  Patient had recurrent sinus infection treated with multiple antibiotic course.  Recently had a PET scan done which shows bilateral symmetrical lymphadenopathy since nasopharyngeal uptake of unspecified nature. No chills.  No fever.  No nausea.  No vomiting.  No diarrhea. Since last evaluation patient had ENT evaluation and did not feel any palpable lymph node.  Patient does not have any chills fever appetite has been stable.  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  Active.  No fevers, sweats or weight loss. PERFORMANCE STATUS (ECOG):  0 HEENT:  No visual changes, runny nose, sore throat, mouth sores or tenderness. Had recurrent sinus infection recently from the patient is gradually recovering Lungs: No shortness of breath or cough.  No hemoptysis. Cardiac:  No chest pain, palpitations, orthopnea, or PND. GI:  No nausea, vomiting, diarrhea,  constipation, melena or hematochezia. GU:  No urgency, frequency, dysuria, or hematuria. Musculoskeletal:  No back pain.  No joint pain.  No muscle tenderness. Extremities:  No pain or swelling. Skin:  No rashes or skin changes. Neuro:  No headache, numbness or weakness, balance or coordination issues. Endocrine:  No diabetes, thyroid issues, hot flashes or night sweats. Psych:  No mood changes, depression or anxiety. Pain:  No focal pain. Review of systems:  All other systems reviewed and found to be negative. As per HPI. Otherwise, a complete review of systems is negatve.    Chron's:    Kidney Stones:    Lymphoma:    GERD - Esophageal Reflux:    Port Placement:    Colonoscopy:    Cholecystectomy:    Right modified radical neck dissection: Sep 2014  Preventive Screening:  Has patient had any of the following test? Colonscopy  Prostate Exam (1)   Last Colonoscopy: 2007-DrRoetta Sessions)   Last Prostate Exam: 2013-Dr. 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  Information was given .  Available resources had been discussed.  We will follow-up on subsequent appointments regarding this issue HEALTH MAINTENANCE: Social History  Substance Use Topics  . Smoking status: Never Smoker   . Smokeless tobacco: None  . Alcohol Use:  None      No Known Allergies  Current Outpatient Prescriptions  Medication Sig Dispense Refill  . Cetirizine HCl 10 MG CAPS Take by mouth.    . losartan-hydrochlorothiazide (HYZAAR) 50-12.5 MG per tablet TAKE 1 TABLET ONCE A DAY    . pantoprazole (PROTONIX) 40 MG tablet Take by mouth.    .  ranitidine (ZANTAC) 150 MG tablet Take by mouth.     No current facility-administered medications for this visit.    OBJECTIVE:  Filed Vitals:   09/12/15 1554  BP: 129/86  Pulse: 70  Temp: 97.8 F (36.6 C)     Body mass index is 27.81 kg/(m^2).    ECOG FS:0 - Asymptomatic  PHYSICAL EXAM: GENERAL:  Well developed, well nourished, sitting comfortably in the exam room in no acute distress. MENTAL STATUS:  Alert and oriented to person, place and time. HEAD:  Normocephalic, atraumatic, face symmetric, no Cushingoid features.  ENT:  Oropharynx clear without lesion.  Tongue normal. Mucous membranes moist.  RESPIRATORY:  Clear to auscultation without rales, wheezes or rhonchi. CARDIOVASCULAR:  Regular rate and rhythm without murmur, rub or gallop. BREAST:  Right breast without masses, skin changes or nipple discharge.  Left breast without masses, skin changes or nipple discharge. ABDOMEN:  Soft, non-tender, with active bowel sounds, and no hepatosplenomegaly.  No masses. BACK:  No CVA tenderness.  No tenderness on percussion of the back or rib cage. SKIN:  No rashes, ulcers or lesions. EXTREMITIES: No edema, no skin discoloration or tenderness.  No palpable cords. LYMPH NODES: No palpable cervical, supraclavicular, axillary or inguinal adenopathy  There are no palpable lymphadenopathy E1 on deep palpation NEUROLOGICAL: Unremarkable. PSYCH:  Appropriate.   LAB RESULTS:  CBC Latest Ref Rng 09/12/2015 07/24/2015  WBC 3.8 - 10.6 K/uL 5.2 5.0  Hemoglobin 13.0 - 18.0 g/dL 15.8 15.0  Hematocrit 40.0 - 52.0 % 46.9 43.0  Platelets 150 - 440 K/uL 214 201    Appointment on 09/12/2015  Component Date Value Ref Range Status  . WBC 09/12/2015 5.2  3.8 - 10.6 K/uL Final  . RBC 09/12/2015 5.49  4.40 - 5.90 MIL/uL Final  . Hemoglobin 09/12/2015 15.8  13.0 - 18.0 g/dL Final  . HCT 09/12/2015 46.9  40.0 - 52.0 % Final  . MCV 09/12/2015 85.3  80.0 - 100.0 fL Final  . MCH 09/12/2015 28.8  26.0 -  34.0 pg Final  . MCHC 09/12/2015 33.8  32.0 - 36.0 g/dL Final  . RDW 09/12/2015 13.9  11.5 - 14.5 % Final  . Platelets 09/12/2015 214  150 - 440 K/uL Final  . Neutrophils Relative % 09/12/2015 58   Final  . Neutro Abs 09/12/2015 3.1  1.4 - 6.5 K/uL Final  . Lymphocytes Relative 09/12/2015 25   Final  . Lymphs Abs 09/12/2015 1.3  1.0 - 3.6 K/uL Final  . Monocytes Relative 09/12/2015 12   Final  . Monocytes Absolute 09/12/2015 0.6  0.2 - 1.0 K/uL Final  . Eosinophils Relative 09/12/2015 4   Final  . Eosinophils Absolute 09/12/2015 0.2  0 - 0.7 K/uL Final  . Basophils Relative 09/12/2015 1   Final  . Basophils Absolute 09/12/2015 0.0  0 - 0.1 K/uL Final  . Sodium 09/12/2015 134* 135 - 145 mmol/L Final  . Potassium 09/12/2015 3.8  3.5 - 5.1 mmol/L Final  . Chloride 09/12/2015 102  101 - 111 mmol/L Final  . CO2 09/12/2015 27  22 - 32 mmol/L Final  . Glucose, Bld 09/12/2015 103*  65 - 99 mg/dL Final  . BUN 09/12/2015 17  6 - 20 mg/dL Final  . Creatinine, Ser 09/12/2015 1.07  0.61 - 1.24 mg/dL Final  . Calcium 09/12/2015 8.0* 8.9 - 10.3 mg/dL Final  . Total Protein 09/12/2015 7.2  6.5 - 8.1 g/dL Final  . Albumin 09/12/2015 4.3  3.5 - 5.0 g/dL Final  . AST 09/12/2015 24  15 - 41 U/L Final  . ALT 09/12/2015 24  17 - 63 U/L Final  . Alkaline Phosphatase 09/12/2015 72  38 - 126 U/L Final  . Total Bilirubin 09/12/2015 0.7  0.3 - 1.2 mg/dL Final  . GFR calc non Af Amer 09/12/2015 >60  >60 mL/min Final  . GFR calc Af Amer 09/12/2015 >60  >60 mL/min Final   Comment: (NOTE) The eGFR has been calculated using the CKD EPI equation. This calculation has not been validated in all clinical situations. eGFR's persistently <60 mL/min signify possible Chronic Kidney Disease.   . Anion gap 09/12/2015 5  5 - 15 Final  . LDH 09/12/2015 151  98 - 192 U/L Final       STUDIES: No results found.  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 treated  with R CHOP chemotherapy and radiation therapy Recent PET scan shows nasopharyngeal uptake of unspecified nature and bilateral symmetrical cervical lymphadenopathy Most likely we are dealing with infection rather than recurrent disease I do not feel any lymph node patient remains asymptomatic Case was discussed in tumor conference biopsy is not possible but will get ENT evaluation Possibility of repeating PET scan in 6 months   MEDICAL DECISION MAKING:  PET scan has been reviewed Case was discussed in tumor conference ENT evaluation would be considered Observation and repeating PET scan may be appropriate approach unless ENT physicians find a lymph node to be biopsied or any other abnormality in nasopharyngeal area  Patient expressed understanding and was in agreement with this plan. He also understands that He can call clinic at any time with any questions, concerns, or complaints.    No matching staging information was found for the patient.  Forest Gleason, MD   09/12/2015 4:13 PM

## 2015-09-12 NOTE — Progress Notes (Signed)
Pet scan 8/18, patient followed up with Dr. Tami Ribas.  Patient reports that possible cause of spots that were seen was sinus infection.

## 2015-09-16 ENCOUNTER — Encounter: Payer: Self-pay | Admitting: Oncology

## 2016-01-08 ENCOUNTER — Ambulatory Visit
Admission: RE | Admit: 2016-01-08 | Discharge: 2016-01-08 | Disposition: A | Discharge status: Home / Self Care | Payer: 59 | Source: Ambulatory Visit | Attending: Oncology | Admitting: Oncology

## 2016-01-08 DIAGNOSIS — N200 Calculus of kidney: Secondary | ICD-10-CM | POA: Insufficient documentation

## 2016-01-08 DIAGNOSIS — N400 Enlarged prostate without lower urinary tract symptoms: Secondary | ICD-10-CM | POA: Diagnosis not present

## 2016-01-08 DIAGNOSIS — C8510 Unspecified B-cell lymphoma, unspecified site: Secondary | ICD-10-CM | POA: Diagnosis not present

## 2016-01-08 DIAGNOSIS — K4020 Bilateral inguinal hernia, without obstruction or gangrene, not specified as recurrent: Secondary | ICD-10-CM | POA: Diagnosis not present

## 2016-01-08 DIAGNOSIS — K449 Diaphragmatic hernia without obstruction or gangrene: Secondary | ICD-10-CM | POA: Diagnosis not present

## 2016-01-08 LAB — GLUCOSE, CAPILLARY: Glucose-Capillary: 102 mg/dL — ABNORMAL HIGH (ref 65–99)

## 2016-01-08 MED ORDER — FLUDEOXYGLUCOSE F - 18 (FDG) INJECTION
11.6500 | Freq: Once | INTRAVENOUS | Status: AC | PRN
  Administered 2016-01-08: 11.65 via INTRAVENOUS

## 2016-01-10 ENCOUNTER — Inpatient Hospital Stay (HOSPITAL_BASED_OUTPATIENT_CLINIC_OR_DEPARTMENT_OTHER): Payer: 59 | Admitting: Oncology

## 2016-01-10 ENCOUNTER — Inpatient Hospital Stay: Payer: 59 | Attending: Oncology

## 2016-01-10 VITALS — BP 158/86 | HR 77 | Temp 97.1°F | Resp 17 | Wt 210.1 lb

## 2016-01-10 DIAGNOSIS — C8510 Unspecified B-cell lymphoma, unspecified site: Secondary | ICD-10-CM

## 2016-01-10 DIAGNOSIS — Z79899 Other long term (current) drug therapy: Secondary | ICD-10-CM

## 2016-01-10 DIAGNOSIS — N281 Cyst of kidney, acquired: Secondary | ICD-10-CM | POA: Diagnosis not present

## 2016-01-10 DIAGNOSIS — Z9221 Personal history of antineoplastic chemotherapy: Secondary | ICD-10-CM | POA: Diagnosis not present

## 2016-01-10 DIAGNOSIS — Z923 Personal history of irradiation: Secondary | ICD-10-CM | POA: Insufficient documentation

## 2016-01-10 DIAGNOSIS — K4020 Bilateral inguinal hernia, without obstruction or gangrene, not specified as recurrent: Secondary | ICD-10-CM | POA: Insufficient documentation

## 2016-01-10 DIAGNOSIS — N400 Enlarged prostate without lower urinary tract symptoms: Secondary | ICD-10-CM | POA: Diagnosis not present

## 2016-01-10 DIAGNOSIS — Z8572 Personal history of non-Hodgkin lymphomas: Secondary | ICD-10-CM | POA: Insufficient documentation

## 2016-01-10 DIAGNOSIS — K449 Diaphragmatic hernia without obstruction or gangrene: Secondary | ICD-10-CM | POA: Diagnosis not present

## 2016-01-10 LAB — CBC WITH DIFFERENTIAL/PLATELET
BASOS ABS: 0 10*3/uL (ref 0–0.1)
BASOS PCT: 1 %
EOS ABS: 0.2 10*3/uL (ref 0–0.7)
EOS PCT: 4 %
HCT: 45.3 % (ref 40.0–52.0)
Hemoglobin: 16 g/dL (ref 13.0–18.0)
LYMPHS ABS: 1.3 10*3/uL (ref 1.0–3.6)
Lymphocytes Relative: 24 %
MCH: 29.9 pg (ref 26.0–34.0)
MCHC: 35.2 g/dL (ref 32.0–36.0)
MCV: 84.8 fL (ref 80.0–100.0)
Monocytes Absolute: 0.5 10*3/uL (ref 0.2–1.0)
Monocytes Relative: 10 %
NEUTROS PCT: 61 %
Neutro Abs: 3.4 10*3/uL (ref 1.4–6.5)
PLATELETS: 220 10*3/uL (ref 150–440)
RBC: 5.34 MIL/uL (ref 4.40–5.90)
RDW: 13.8 % (ref 11.5–14.5)
WBC: 5.5 10*3/uL (ref 3.8–10.6)

## 2016-01-10 LAB — COMPREHENSIVE METABOLIC PANEL
ALBUMIN: 4.5 g/dL (ref 3.5–5.0)
ALT: 30 U/L (ref 17–63)
AST: 22 U/L (ref 15–41)
Alkaline Phosphatase: 78 U/L (ref 38–126)
Anion gap: 5 (ref 5–15)
BUN: 16 mg/dL (ref 6–20)
CHLORIDE: 104 mmol/L (ref 101–111)
CO2: 26 mmol/L (ref 22–32)
CREATININE: 1.01 mg/dL (ref 0.61–1.24)
Calcium: 8.5 mg/dL — ABNORMAL LOW (ref 8.9–10.3)
GFR calc Af Amer: >60 mL/min (ref >60)
GFR calc non Af Amer: >60 mL/min (ref >60)
Glucose, Bld: 126 mg/dL — ABNORMAL HIGH (ref 65–99)
POTASSIUM: 3.4 mmol/L — AB (ref 3.5–5.1)
SODIUM: 135 mmol/L (ref 135–145)
Total Bilirubin: 0.6 mg/dL (ref 0.3–1.2)
Total Protein: 7.2 g/dL (ref 6.5–8.1)

## 2016-01-10 LAB — LACTATE DEHYDROGENASE: LDH: 149 U/L (ref 98–192)

## 2016-01-11 ENCOUNTER — Encounter: Payer: Self-pay | Admitting: Oncology

## 2016-01-11 NOTE — Progress Notes (Signed)
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  PET scan has been reviewed shows increased uptake in the neck as well as in the lungno other evidence of FDG activity lymphadenopathy  4.follicular lymphith 65% diffuse B cell component , biopsy from  neck.  (October, 2014) Stage IA disease 5.patient was started on chemotherapy with  RCHOP on September 07, 2013 6patient finished chemotherapy R. CHOP x4 in December of 2014 7. Involved field radiation therapy in January of 2015 INTERVAL HISTORY:  65 year old gentleman came today further follow-up.  Patient had recurrent sinus infection treated with multiple antibiotic course.  Recently had a PET scan done which shows bilateral symmetrical lymphadenopathy since nasopharyngeal uptake of unspecified nature. No chills.  No fever.  No nausea.  No vomiting.  No diarrhea. No chills fever and abdominal pain.  Patient had a repeat PET scan done You are to discuss the results of the PET scan and further evaluation regarding diffuse large cell lymphoma   REVIEW OF SYSTEMS:   GENERAL:  Feels good.  Active.  No fevers, sweats or weight loss. PERFORMANCE STATUS (ECOG):  0 HEENT:  No visual changes, runny nose, sore throat, mouth sores or tenderness. Had recurrent sinus infection recently from the patient is gradually recovering Lungs: No shortness of breath or cough.  No hemoptysis. Cardiac:  No chest pain, palpitations, orthopnea, or PND. GI:  No nausea, vomiting, diarrhea, constipation, melena or hematochezia. GU:   No urgency, frequency, dysuria, or hematuria. Musculoskeletal:  No back pain.  No joint pain.  No muscle tenderness. Extremities:  No pain or swelling. Skin:  No rashes or skin changes. Neuro:  No headache, numbness or weakness, balance or coordination issues. Endocrine:  No diabetes, thyroid issues, hot flashes or night sweats. Psych:  No mood changes, depression or anxiety. Pain:  No focal pain. Review of systems:  All other systems reviewed and found to be negative. As per HPI. Otherwise, a complete review of systems is negatve.    Chron's:    Kidney Stones:    Lymphoma:    GERD - Esophageal Reflux:    Port Placement:    Colonoscopy:    Cholecystectomy:    Right modified radical neck dissection: Sep 2014  Preventive Screening:  Has patient had any of the following test? Colonscopy  Prostate Exam (1)   Last Colonoscopy: 2007-DrRoetta Sessions)   Last Prostate Exam: 2013-Dr. 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  Information was given .  Available resources had been discussed.  We will follow-up on subsequent appointments regarding this issue HEALTH MAINTENANCE: Social History  Substance Use Topics  . Smoking status: Never Smoker   . Smokeless tobacco: None  . Alcohol Use: None  No Known Allergies  Current Outpatient Prescriptions  Medication Sig Dispense Refill  . Cetirizine HCl 10 MG CAPS Take by mouth.    . losartan-hydrochlorothiazide (HYZAAR) 50-12.5 MG per tablet TAKE 1 TABLET ONCE A DAY    . pantoprazole (PROTONIX) 40 MG tablet Take by mouth.    . ranitidine (ZANTAC) 150 MG tablet Take by mouth.       No current facility-administered medications for this visit.    OBJECTIVE:  Filed Vitals:   01/10/16 1507  BP: 158/86  Pulse: 77  Temp: 97.1 F (36.2 C)  Resp: 17     Body mass index is 27.73 kg/(m^2).    ECOG FS:0 - Asymptomatic  PHYSICAL EXAM: GENERAL:  Well developed, well nourished, sitting comfortably in the exam room in no acute distress. MENTAL STATUS:  Alert and oriented to person, place and time. HEAD:  Normocephalic, atraumatic, face symmetric, no Cushingoid features.  ENT:  Oropharynx clear without lesion.  Tongue normal. Mucous membranes moist.  RESPIRATORY:  Clear to auscultation without rales, wheezes or rhonchi. CARDIOVASCULAR:  Regular rate and rhythm without murmur, rub or gallop. BREAST:  Right breast without masses, skin changes or nipple discharge.  Left breast without masses, skin changes or nipple discharge. ABDOMEN:  Soft, non-tender, with active bowel sounds, and no hepatosplenomegaly.  No masses. BACK:  No CVA tenderness.  No tenderness on percussion of the back or rib cage. SKIN:  No rashes, ulcers or lesions. EXTREMITIES: No edema, no skin discoloration or tenderness.  No palpable cords. LYMPH NODES: No palpable cervical, supraclavicular, axillary or inguinal adenopathy  There are no palpable lymphadenopathy E1 on deep palpation NEUROLOGICAL: Unremarkable. PSYCH:  Appropriate.   LAB RESULTS:  CBC Latest Ref Rng 01/10/2016 09/12/2015  WBC 3.8 - 10.6 K/uL 5.5 5.2  Hemoglobin 13.0 - 18.0 g/dL 16.0 15.8  Hematocrit 40.0 - 52.0 % 45.3 46.9  Platelets 150 - 440 K/uL 220 214    Appointment on 01/10/2016  Component Date Value Ref Range Status  . WBC 01/10/2016 5.5  3.8 - 10.6 K/uL Final  . RBC 01/10/2016 5.34  4.40 - 5.90 MIL/uL Final  . Hemoglobin 01/10/2016 16.0  13.0 - 18.0 g/dL Final  . HCT 01/10/2016 45.3  40.0 - 52.0 % Final  . MCV 01/10/2016 84.8  80.0 - 100.0 fL Final  . MCH 01/10/2016 29.9  26.0 - 34.0 pg Final  . MCHC 01/10/2016 35.2   32.0 - 36.0 g/dL Final  . RDW 01/10/2016 13.8  11.5 - 14.5 % Final  . Platelets 01/10/2016 220  150 - 440 K/uL Final  . Neutrophils Relative % 01/10/2016 61   Final  . Neutro Abs 01/10/2016 3.4  1.4 - 6.5 K/uL Final  . Lymphocytes Relative 01/10/2016 24   Final  . Lymphs Abs 01/10/2016 1.3  1.0 - 3.6 K/uL Final  . Monocytes Relative 01/10/2016 10   Final  . Monocytes Absolute 01/10/2016 0.5  0.2 - 1.0 K/uL Final  . Eosinophils Relative 01/10/2016 4   Final  . Eosinophils Absolute 01/10/2016 0.2  0 - 0.7 K/uL Final  . Basophils Relative 01/10/2016 1   Final  . Basophils Absolute 01/10/2016 0.0  0 - 0.1 K/uL Final  . Sodium 01/10/2016 135  135 - 145 mmol/L Final  . Potassium 01/10/2016 3.4* 3.5 - 5.1 mmol/L Final  . Chloride 01/10/2016 104  101 - 111 mmol/L Final  . CO2 01/10/2016 26  22 - 32 mmol/L Final  . Glucose, Bld 01/10/2016 126* 65 -  99 mg/dL Final  . BUN 01/10/2016 16  6 - 20 mg/dL Final  . Creatinine, Ser 01/10/2016 1.01  0.61 - 1.24 mg/dL Final  . Calcium 01/10/2016 8.5* 8.9 - 10.3 mg/dL Final  . Total Protein 01/10/2016 7.2  6.5 - 8.1 g/dL Final  . Albumin 01/10/2016 4.5  3.5 - 5.0 g/dL Final  . AST 01/10/2016 22  15 - 41 U/L Final  . ALT 01/10/2016 30  17 - 63 U/L Final  . Alkaline Phosphatase 01/10/2016 78  38 - 126 U/L Final  . Total Bilirubin 01/10/2016 0.6  0.3 - 1.2 mg/dL Final  . GFR calc non Af Amer 01/10/2016 >60  >60 mL/min Final  . GFR calc Af Amer 01/10/2016 >60  >60 mL/min Final   Comment: (NOTE) The eGFR has been calculated using the CKD EPI equation. This calculation has not been validated in all clinical situations. eGFR's persistently <60 mL/min signify possible Chronic Kidney Disease.   . Anion gap 01/10/2016 5  5 - 15 Final  . LDH 01/10/2016 149  98 - 192 U/L Final  Hospital Outpatient Visit on 01/08/2016  Component Date Value Ref Range Status  . Glucose-Capillary 01/08/2016 102* 65 - 99 mg/dL Final       STUDIES: Nm Pet Image Restag (ps)  Skull Base To Thigh  01/08/2016  CLINICAL DATA:  Subsequent treatment strategy for diffuse large B-cell lymphoma diagnosed in 08/23/13, presenting for restaging. EXAM: NUCLEAR MEDICINE PET SKULL BASE TO THIGH TECHNIQUE: 11.7 mCi F-18 FDG was injected intravenously. Full-ring PET imaging was performed from the skull base to thigh after the radiotracer. CT data was obtained and used for attenuation correction and anatomic localization. FASTING BLOOD GLUCOSE:  Value: 102 mg/dl COMPARISON:  07/12/2015 PET-CT. FINDINGS: NECK No hypermetabolic lymph nodes in the neck. The previously described bilateral levels 2, 3 and 4 hypermetabolic neck lymph nodes have resolved. The previously described posterior nasopharyngeal hypermetabolism has resolved. The previously described symmetric palatine tonsil hypermetabolism has significantly decreased with symmetric mild residual hypermetabolism in the palatine tonsils with max SUV 4.9, previous max SUV 14.1, which is probably physiologic. CHEST No hypermetabolic axillary, mediastinal or hilar nodes. Mildly atherosclerotic nonaneurysmal thoracic aorta. No acute consolidative airspace disease or significant pulmonary nodules. Stable bulla at the anterior left lung base. ABDOMEN/PELVIS No abnormal hypermetabolic activity within the liver, pancreas, adrenal glands, or spleen. There is a new mildly enlarged hypermetabolic 1.4 cm left mesenteric node (series 3/ image 160) with max SUV 5.1. Stable mild haziness of the central mesentery. No additional hypermetabolic lymph nodes in the abdomen or pelvis. Cholecystectomy. Normal size spleen. Simple 1.4 cm renal cyst in the medial interpolar left kidney. Simple 2.2 cm renal cyst in the medial lower left kidney. Nonobstructing 4 mm stone in the lower left kidney. Atherosclerotic nonaneurysmal abdominal aorta. Stable moderate hiatal hernia. Stable mild to moderate prostatomegaly with nonspecific internal prostatic calcification. Stable small fat  containing bilateral inguinal hernias. SKELETON No focal hypermetabolic activity to suggest skeletal metastasis. IMPRESSION: 1. New mild hypermetabolic left mesenteric lymphadenopathy, in keeping with lymphoma recurrence. 2. No additional sites of hypermetabolic lymphoma. Previously described hypermetabolic bilateral cervical lymphadenopathy has resolved. 3. Chronic findings include nonobstructing left nephrolithiasis, moderate hiatal hernia, prostatomegaly and bilateral fat containing inguinal hernias. Electronically Signed   By: Ilona Sorrel M.D.   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 treated with R CHOP chemotherapy and radiation therapy 2pet scan has  been reviewed independently. All increased uptake in the nasopharyngeal area and cervical area has been resolved. But now patient has mild hypermetabolic mesentric  lymph node with a single lymph node.  Would discuss his case in tumor conference. Doubt this lymph node can be biopsied.  One option would be to continue observation and repeat PET scan in 3 months to see if there is any progressive lymphadenopathy  unless during our discussion that this lymph node can be biopsied easily Observation has been planned.  Total duration of visit was 25 minutes.  50% or more time was spent in counseling patient and family regarding prognosis and options of treatment and available resources Patient expressed understanding and was in agreement with this plan. He also understands that He can call clinic at any time with any questions, concerns, or complaints.    No matching staging information was found for the patient.  Forest Gleason, MD   01/11/2016 8:46 AM

## 2016-04-07 ENCOUNTER — Other Ambulatory Visit: Payer: Self-pay | Admitting: Family Medicine

## 2016-04-08 ENCOUNTER — Encounter
Admission: RE | Admit: 2016-04-08 | Discharge: 2016-04-08 | Disposition: A | Discharge status: Home / Self Care | Payer: 59 | Source: Ambulatory Visit | Attending: Oncology | Admitting: Oncology

## 2016-04-08 DIAGNOSIS — C8510 Unspecified B-cell lymphoma, unspecified site: Secondary | ICD-10-CM | POA: Insufficient documentation

## 2016-04-08 LAB — GLUCOSE, CAPILLARY: GLUCOSE-CAPILLARY: 97 mg/dL (ref 65–99)

## 2016-04-08 MED ORDER — FLUDEOXYGLUCOSE F - 18 (FDG) INJECTION
12.5700 | Freq: Once | INTRAVENOUS | Status: AC | PRN
  Administered 2016-04-08: 12.57 via INTRAVENOUS

## 2016-04-10 ENCOUNTER — Inpatient Hospital Stay: Payer: 59 | Attending: Oncology | Admitting: Oncology

## 2016-04-10 VITALS — BP 145/95 | HR 72 | Temp 96.8°F | Resp 18 | Wt 208.8 lb

## 2016-04-10 DIAGNOSIS — R590 Localized enlarged lymph nodes: Secondary | ICD-10-CM | POA: Insufficient documentation

## 2016-04-10 DIAGNOSIS — I700 Atherosclerosis of aorta: Secondary | ICD-10-CM | POA: Diagnosis not present

## 2016-04-10 DIAGNOSIS — Z9221 Personal history of antineoplastic chemotherapy: Secondary | ICD-10-CM | POA: Diagnosis not present

## 2016-04-10 DIAGNOSIS — Z8572 Personal history of non-Hodgkin lymphomas: Secondary | ICD-10-CM

## 2016-04-10 DIAGNOSIS — K449 Diaphragmatic hernia without obstruction or gangrene: Secondary | ICD-10-CM | POA: Diagnosis not present

## 2016-04-10 DIAGNOSIS — K4020 Bilateral inguinal hernia, without obstruction or gangrene, not specified as recurrent: Secondary | ICD-10-CM | POA: Insufficient documentation

## 2016-04-10 DIAGNOSIS — N200 Calculus of kidney: Secondary | ICD-10-CM | POA: Diagnosis not present

## 2016-04-10 DIAGNOSIS — Z923 Personal history of irradiation: Secondary | ICD-10-CM

## 2016-04-10 DIAGNOSIS — Z79899 Other long term (current) drug therapy: Secondary | ICD-10-CM | POA: Diagnosis not present

## 2016-04-10 DIAGNOSIS — C8330 Diffuse large B-cell lymphoma, unspecified site: Secondary | ICD-10-CM

## 2016-04-10 DIAGNOSIS — Z9049 Acquired absence of other specified parts of digestive tract: Secondary | ICD-10-CM | POA: Diagnosis not present

## 2016-04-10 DIAGNOSIS — N400 Enlarged prostate without lower urinary tract symptoms: Secondary | ICD-10-CM | POA: Insufficient documentation

## 2016-04-10 DIAGNOSIS — I517 Cardiomegaly: Secondary | ICD-10-CM | POA: Diagnosis not present

## 2016-04-10 DIAGNOSIS — C8510 Unspecified B-cell lymphoma, unspecified site: Secondary | ICD-10-CM

## 2016-04-13 ENCOUNTER — Encounter: Payer: Self-pay | Admitting: Oncology

## 2016-04-13 NOTE — Progress Notes (Signed)
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  (October, 2014) Stage IA disease 5.patient was started on chemotherapy with  RCHOP on September 07, 2013 6patient finished chemotherapy R. CHOP x4 in December of 2014 7. Involved field radiation therapy in January of 2015 INTERVAL HISTORY:  65 year old gentleman came today further follow-up.  Patient had recurrent sinus infection treated with multiple antibiotic course.  Recently had a PET scan done which shows bilateral symmetrical lymphadenopathy since nasopharyngeal uptake of unspecified nature. No chills.  No fever.  No nausea.  No vomiting.  No diarrhea. No chills fever and abdominal pain.  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 No abdominal pain.  No nausea.  No vomiting.   REVIEW OF SYSTEMS:   GENERAL:  Feels good.  Active.  No fevers, sweats or weight loss. PERFORMANCE STATUS (ECOG):  0 HEENT:  No visual changes, runny nose, sore throat, mouth sores or tenderness. Had recurrent sinus infection recently from the patient is gradually recovering Lungs: No shortness of breath or cough.  No hemoptysis. Cardiac:  No chest pain, palpitations, orthopnea, or  PND. GI:  No nausea, vomiting, diarrhea, constipation, melena or hematochezia. GU:  No urgency, frequency, dysuria, or hematuria. Musculoskeletal:  No back pain.  No joint pain.  No muscle tenderness. Extremities:  No pain or swelling. Skin:  No rashes or skin changes. Neuro:  No headache, numbness or weakness, balance or coordination issues. Endocrine:  No diabetes, thyroid issues, hot flashes or night sweats. Psych:  No mood changes, depression or anxiety. Pain:  No focal pain. Review of systems:  All other systems reviewed and found to be negative. As per HPI. Otherwise, a complete review of systems is negatve.     Kidney Stones:    Lymphoma:    GERD - Esophageal Reflux:    Port Placement:    Colonoscopy:    Cholecystectomy:    Right modified radical neck dissection: Sep 2014  Preventive Screening:  Has patient had any of the following test? Colonscopy  Prostate Exam (1)   Last Colonoscopy: 2007-DrRoetta Reynolds)   Last Prostate Exam: 2013-Dr. Phillips Reynolds)   Smoking History: Smoking History Never Smoked.(1)  PFSH: Comments: Father had lymphoma exact type not known  aunt  had primary brain cancer  Social History: negative alcohol, negative tobacco  Comments: fork Artist  Additional Past Medical and Surgical History: As above   ADVANCED DIRECTIVES:  Patient does not have any living will or healthcare power of attorney.  Information was given .  Available resources had been discussed.  We will follow-up on subsequent appointments regarding this issue HEALTH MAINTENANCE: Social History  Substance Use Topics  . Smoking status: Never Smoker   . Smokeless  tobacco: None  . Alcohol Use: None      No Known Allergies  Current Outpatient Prescriptions  Medication Sig Dispense Refill  . Cetirizine HCl 10 MG CAPS Take by mouth.    . losartan-hydrochlorothiazide (HYZAAR) 50-12.5 MG per tablet TAKE 1 TABLET ONCE A DAY    . pantoprazole (PROTONIX) 40 MG  tablet Take by mouth.    . ranitidine (ZANTAC) 150 MG tablet Take by mouth.     No current facility-administered medications for this visit.    OBJECTIVE:  Filed Vitals:   04/10/16 1612  BP: 145/95  Pulse: 72  Temp: 96.8 F (36 C)  Resp: 18     Body mass index is 27.55 kg/(m^2).    ECOG FS:0 - Asymptomatic  PHYSICAL EXAM: GENERAL:  Well developed, well nourished, sitting comfortably in the exam room in no acute distress. MENTAL STATUS:  Alert and oriented to person, place and time. HEAD:  Normocephalic, atraumatic, face symmetric, no Cushingoid features.  ENT:  Oropharynx clear without lesion.  Tongue normal. Mucous membranes moist.  RESPIRATORY:  Clear to auscultation without rales, wheezes or rhonchi. CARDIOVASCULAR:  Regular rate and rhythm without murmur, rub or gallop. BREAST:  Right breast without masses, skin changes or nipple discharge.  Left breast without masses, skin changes or nipple discharge. ABDOMEN:  Soft, non-tender, with active bowel sounds, and no hepatosplenomegaly.  No masses. BACK:  No CVA tenderness.  No tenderness on percussion of the back or rib cage. SKIN:  No rashes, ulcers or lesions. EXTREMITIES: No edema, no skin discoloration or tenderness.  No palpable cords. LYMPH NODES: No palpable cervical, supraclavicular, axillary or inguinal adenopathy  There are no palpable lymphadenopathy E1 on deep palpation NEUROLOGICAL: Unremarkable. PSYCH:  Appropriate.   LAB RESULTS:  CBC Latest Ref Rng 01/10/2016 09/12/2015  WBC 3.8 - 10.6 K/uL 5.5 5.2  Hemoglobin 13.0 - 18.0 g/dL 16.0 15.8  Hematocrit 40.0 - 52.0 % 45.3 46.9  Platelets 150 - 440 K/uL 220 214    Hospital Outpatient Visit on 04/08/2016  Component Date Value Ref Range Status  . Glucose-Capillary 04/08/2016 97  65 - 99 mg/dL Final       STUDIES: Nm Pet Image Restag (ps) Skull Base To Thigh  04/08/2016  CLINICAL DATA:  Subsequent treatment strategy for B-cell lymphoma. Restaging. EXAM:  NUCLEAR MEDICINE PET SKULL BASE TO THIGH TECHNIQUE: 12.6 mCi F-18 FDG was injected intravenously. Full-ring PET imaging was performed from the skull base to thigh after the radiotracer. CT data was obtained and used for attenuation correction and anatomic localization. FASTING BLOOD GLUCOSE:  Value: 97 mg/dl COMPARISON:  01/08/2016 FINDINGS: NECK No areas of abnormal hypermetabolism. CHEST No areas of abnormal hypermetabolism. ABDOMEN/PELVIS misregistration between CT and PET images within the mid abdomen. Again demonstrated is a hypermetabolic jejunal mesenteric node. This measures 1.9 cm and a S.U.V. max of 7.1 on image 191/series 3. Compare 1.4 cm and a S.U.V. max of 5.1 on the prior exam. No retroperitoneal or pelvic sidewall hypermetabolic nodes. SKELETON No abnormal marrow activity. CT IMAGES PERFORMED FOR ATTENUATION CORRECTION No cervical adenopathy. Tortuous thoracic aorta. Mild cardiomegaly. Small to moderate hiatal hernia. Lower pole left renal collecting system calculus. Interpolar left renal lesions are low density and likely cysts. Abdominal aortic atherosclerosis. Cholecystectomy. Bilateral fat containing inguinal hernias. Mild prostatomegaly. IMPRESSION: 1. Increase in size and hypermetabolism of an isolated jejunal mesenteric node. This is consistent with disease progression. Evaluation mildly degraded by misregistration/motion between CT and PET images. 2. No new sites  of disease identified. 3. Incidental findings, including hiatal hernia, left nephrolithiasis, prostatomegaly, fat containing inguinal hernias. Electronically Signed   By: Abigail Miyamoto M.D.   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 PET scan continues to show an isolated jejunal mesenteric lymph node which has increased in size and hypermetabolic is  a I had a long discussion with patient be discussed this case in tumor conference possibility of biopsying either with EUS or needle can reconsider or observation can be considered. Total duration of visit was 25 minutes.  50% or more time was spent in counseling patient and family regarding prognosis and options of treatment and available resources.    No matching staging information was found for the patient.  Forest Gleason, MD   04/13/2016 7:29 AM

## 2016-04-23 ENCOUNTER — Telehealth: Payer: Self-pay | Admitting: *Deleted

## 2016-04-23 NOTE — Telephone Encounter (Signed)
MD would like to see pt tomorrow to discuss options. Scheduling will notify pt with appt details.

## 2016-04-23 NOTE — Telephone Encounter (Signed)
Patient would like to know his results.

## 2016-04-23 NOTE — Telephone Encounter (Signed)
Called patient - scheduling has already called patient.  Verified appointment time for tomorrow @ 3:30 pm.

## 2016-04-23 NOTE — Telephone Encounter (Signed)
Attempted to call patient.  Line busy.  Will try later. 

## 2016-04-24 ENCOUNTER — Inpatient Hospital Stay: Payer: 59 | Attending: Oncology | Admitting: Oncology

## 2016-04-24 VITALS — BP 142/90 | HR 83 | Temp 97.8°F | Resp 18 | Wt 210.0 lb

## 2016-04-24 DIAGNOSIS — R590 Localized enlarged lymph nodes: Secondary | ICD-10-CM | POA: Insufficient documentation

## 2016-04-24 DIAGNOSIS — K4020 Bilateral inguinal hernia, without obstruction or gangrene, not specified as recurrent: Secondary | ICD-10-CM | POA: Insufficient documentation

## 2016-04-24 DIAGNOSIS — Z9221 Personal history of antineoplastic chemotherapy: Secondary | ICD-10-CM | POA: Diagnosis not present

## 2016-04-24 DIAGNOSIS — N400 Enlarged prostate without lower urinary tract symptoms: Secondary | ICD-10-CM | POA: Diagnosis not present

## 2016-04-24 DIAGNOSIS — Z8572 Personal history of non-Hodgkin lymphomas: Secondary | ICD-10-CM | POA: Diagnosis not present

## 2016-04-24 DIAGNOSIS — Z923 Personal history of irradiation: Secondary | ICD-10-CM

## 2016-04-24 DIAGNOSIS — I700 Atherosclerosis of aorta: Secondary | ICD-10-CM | POA: Diagnosis not present

## 2016-04-24 DIAGNOSIS — Z79899 Other long term (current) drug therapy: Secondary | ICD-10-CM | POA: Insufficient documentation

## 2016-04-24 DIAGNOSIS — N200 Calculus of kidney: Secondary | ICD-10-CM | POA: Diagnosis not present

## 2016-04-24 DIAGNOSIS — I517 Cardiomegaly: Secondary | ICD-10-CM | POA: Diagnosis not present

## 2016-04-24 DIAGNOSIS — C8330 Diffuse large B-cell lymphoma, unspecified site: Secondary | ICD-10-CM

## 2016-04-24 DIAGNOSIS — C8510 Unspecified B-cell lymphoma, unspecified site: Secondary | ICD-10-CM

## 2016-04-24 NOTE — Progress Notes (Signed)
Patient states he has a summer cold with drainage and irritated throat with cough.  Patient states he gets tired easier like walking up stairs and inclines. States his legs "feel dead".

## 2016-04-25 ENCOUNTER — Encounter: Payer: Self-pay | Admitting: Oncology

## 2016-04-25 NOTE — Progress Notes (Signed)
Called patient - scheduling has already called patient.  Verified appointment time for tomorrow @ 3:30 pm.  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  (October, 2014) Stage IA disease 5.patient was started on chemotherapy with  RCHOP on September 07, 2013 6patient finished chemotherapy R. CHOP x4 in December of 2014 7. Involved field radiation therapy in January of 2015 INTERVAL HISTORY:  65 year old gentleman came today further follow-up.  Patient had recurrent sinus infection treated with multiple antibiotic course.  Recently had a PET scan done which shows bilateral symmetrical lymphadenopathy since nasopharyngeal uptake of unspecified nature. No chills.  No fever.  No nausea.  No vomiting.  No diarrhea. No chills fever and abdominal pain.  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 No abdominal pain.  No nausea.  No vomiting. Patient states he has a summer cold with drainage and irritated throat with cough. Patient states he gets tired easier like walking up stairs and inclines. States his legs "feel dead".   REVIEW OF SYSTEMS:   GENERAL:  Feels good.  Active.  No fevers, sweats or weight loss. PERFORMANCE  STATUS (ECOG):  0 HEENT:  No visual changes, runny nose, sore throat, mouth sores or tenderness. Had recurrent sinus infection recently from the patient is gradually recovering Lungs: No shortness of breath or cough.  No hemoptysis. Cardiac:  No chest pain, palpitations, orthopnea, or PND. GI:  No nausea, vomiting, diarrhea, constipation, melena or hematochezia. GU:  No urgency, frequency, dysuria, or hematuria. Musculoskeletal:  No back pain.  No joint pain.  No muscle tenderness. Extremities:  No pain or swelling. Skin:  No rashes or skin changes. Neuro:  No headache, numbness or weakness, balance or coordination issues. Endocrine:  No diabetes, thyroid issues, hot flashes or night sweats. Psych:  No mood changes, depression or anxiety. Pain:  No focal pain. Review of systems:  All other systems reviewed and found to be negative. As per HPI. Otherwise, a complete review of systems is negatve.     Kidney Stones:    Lymphoma:    GERD - Esophageal Reflux:    Port Placement:    Colonoscopy:    Cholecystectomy:    Right modified radical neck dissection: Sep 2014  Preventive Screening:  Has patient had any of the following test? Colonscopy  Prostate Exam (1)   Last Colonoscopy: 2007-DrRoetta Sessions)   Last Prostate Exam: 2013-Dr. 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  Information was given .  Available resources had been discussed.  We will follow-up on subsequent appointments regarding this issue HEALTH MAINTENANCE: Social History  Substance Use Topics  . Smoking status: Never Smoker   . Smokeless tobacco: None  .  Alcohol Use: None      No Known Allergies  Current Outpatient Prescriptions  Medication Sig Dispense Refill  . Cetirizine HCl 10 MG CAPS Take by mouth.    . losartan-hydrochlorothiazide (HYZAAR) 50-12.5 MG per tablet TAKE 1 TABLET ONCE A DAY    . pantoprazole (PROTONIX) 40 MG tablet Take by mouth.    . ranitidine (ZANTAC) 150 MG tablet Take by mouth.     No current facility-administered medications for this visit.    OBJECTIVE:  Filed Vitals:   04/24/16 1552  BP: 142/90  Pulse: 83  Temp: 97.8 F (36.6 C)  Resp: 18     Body mass index is 27.71 kg/(m^2).    ECOG FS:0 - Asymptomatic  PHYSICAL EXAM: GENERAL:  Well developed, well nourished, sitting comfortably in the exam room in no acute distress. MENTAL STATUS:  Alert and oriented to person, place and time. HEAD:  Normocephalic, atraumatic, face symmetric, no Cushingoid features.  ENT:  Oropharynx clear without lesion.  Tongue normal. Mucous membranes moist.  RESPIRATORY:  Clear to auscultation without rales, wheezes or rhonchi. CARDIOVASCULAR:  Regular rate and rhythm without murmur, rub or gallop. BREAST:  Right breast without masses, skin changes or nipple discharge.  Left breast without masses, skin changes or nipple discharge. ABDOMEN:  Soft, non-tender, with active bowel sounds, and no hepatosplenomegaly.  No masses. BACK:  No CVA tenderness.  No tenderness on percussion of the back or rib cage. SKIN:  No rashes, ulcers or lesions. EXTREMITIES: No edema, no skin discoloration or tenderness.  No palpable cords. LYMPH NODES: No palpable cervical, supraclavicular, axillary or inguinal adenopathy  There are no palpable lymphadenopathy E1 on deep palpation NEUROLOGICAL: Unremarkable. PSYCH:  Appropriate.   LAB RESULTS:  CBC Latest Ref Rng 01/10/2016 09/12/2015  WBC 3.8 - 10.6 K/uL 5.5 5.2  Hemoglobin 13.0 - 18.0 g/dL 16.0 15.8  Hematocrit 40.0 - 52.0 % 45.3 46.9  Platelets 150 - 440 K/uL 220 214    No visits with  results within 5 Day(s) from this visit. Latest known visit with results is:  Hospital Outpatient Visit on 04/08/2016  Component Date Value Ref Range Status  . Glucose-Capillary 04/08/2016 97  65 - 99 mg/dL Final       STUDIES: Nm Pet Image Restag (ps) Skull Base To Thigh  04/08/2016  CLINICAL DATA:  Subsequent treatment strategy for B-cell lymphoma. Restaging. EXAM: NUCLEAR MEDICINE PET SKULL BASE TO THIGH TECHNIQUE: 12.6 mCi F-18 FDG was injected intravenously. Full-ring PET imaging was performed from the skull base to thigh after the radiotracer. CT data was obtained and used for attenuation correction and anatomic localization. FASTING BLOOD GLUCOSE:  Value: 97 mg/dl COMPARISON:  01/08/2016 FINDINGS: NECK No areas of abnormal hypermetabolism. CHEST No areas of abnormal hypermetabolism. ABDOMEN/PELVIS misregistration between CT and PET images within the mid abdomen. Again demonstrated is a hypermetabolic jejunal mesenteric node. This measures 1.9 cm and a S.U.V. max of 7.1 on image 191/series 3. Compare 1.4 cm and a S.U.V. max of 5.1 on the prior exam. No retroperitoneal or pelvic sidewall hypermetabolic nodes. SKELETON No abnormal marrow activity. CT IMAGES PERFORMED FOR ATTENUATION CORRECTION No cervical adenopathy. Tortuous thoracic aorta. Mild cardiomegaly.  Small to moderate hiatal hernia. Lower pole left renal collecting system calculus. Interpolar left renal lesions are low density and likely cysts. Abdominal aortic atherosclerosis. Cholecystectomy. Bilateral fat containing inguinal hernias. Mild prostatomegaly. IMPRESSION: 1. Increase in size and hypermetabolism of an isolated jejunal mesenteric node. This is consistent with disease progression. Evaluation mildly degraded by misregistration/motion between CT and PET images. 2. No new sites of disease identified. 3. Incidental findings, including hiatal hernia, left nephrolithiasis, prostatomegaly, fat containing inguinal hernias.  Electronically Signed   By: Abigail Miyamoto M.D.   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 PET scan continues to show an isolated jejunal mesenteric lymph node which has increased in size and hypermetabolic is a I had a long discussion with patient be discussed this case in tumor conference possibility of biopsying either with EUS or needle can reconsider or observation can be considered. Case was discussed in tumor conference.. Issue regarding needle biopsy had been discussed.  Radiologist [place] Wall[place]ent biopsies would be difficult and might continue to risk of bleeding and perforation Lymph node is not accessible by EUS So was decided that laparoscopy biopsy should be done.  Considering anxiety level of the patient biopsy may be helpful to resolve the issue of a persistent FDG positive lymph node abdominal area.  Patient has previously seen surgical service in [facility] surgical service and I had discussed the situation with Dr. Azalee Course     No matching staging information was found for the patient.  Forest Gleason, MD   04/25/2016 6:15 PM

## 2016-04-30 ENCOUNTER — Other Ambulatory Visit: Payer: Self-pay | Admitting: *Deleted

## 2016-04-30 DIAGNOSIS — C8510 Unspecified B-cell lymphoma, unspecified site: Secondary | ICD-10-CM

## 2016-05-02 ENCOUNTER — Telehealth: Payer: Self-pay | Admitting: *Deleted

## 2016-05-02 NOTE — Telephone Encounter (Signed)
Pt's wife reassured that Dr. Oliva Bustard has spoken with Dr. Azalee Course regarding pt's case. Bethena Roys verbalized understanding.

## 2016-05-02 NOTE — Telephone Encounter (Signed)
-----   Message from Cephus Richer sent at 05/02/2016  9:57 AM EDT ----- Contact: [phone] Please call pt she wants to make sure Dr. Oliva Bustard spoke with Dr. Azalee Course.??

## 2016-05-22 ENCOUNTER — Ambulatory Visit: Payer: Self-pay | Admitting: Surgery

## 2016-05-23 ENCOUNTER — Ambulatory Visit (INDEPENDENT_AMBULATORY_CARE_PROVIDER_SITE_OTHER): Payer: Medicare Other | Admitting: Surgery

## 2016-05-23 ENCOUNTER — Encounter: Payer: Self-pay | Admitting: Surgery

## 2016-05-23 VITALS — BP 132/81 | HR 76 | Temp 98.2°F | Ht 73.0 in | Wt 204.8 lb

## 2016-05-23 DIAGNOSIS — C8301 Small cell B-cell lymphoma, lymph nodes of head, face, and neck: Secondary | ICD-10-CM

## 2016-05-23 NOTE — Patient Instructions (Addendum)
We have made an appointment with Dr Rogue Bussing on 05/28/16.  Please call our office if you have questions or concerns.

## 2016-05-26 ENCOUNTER — Encounter: Payer: Self-pay | Admitting: Surgery

## 2016-05-26 NOTE — Progress Notes (Signed)
Subjective:     Patient ID: Troy Reynolds, male   DOB: 12/19/1950, 65 y.o.   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 Patient did have this and neck dissection performed in 2014 and had treatment and noted previously been in regression. This patient was discussed at tumor board conference between the pathologist radiologist and the oncologist Troy Reynolds  The patient had a PET scan last year which did show a small jejunal lymph node that was hypermetabolic. The patient again had another PET scan this year which showed the same area as hypermetabolic and was slightly larger. Unfortunately Dr. COliva Bustardhas now retired and patient does not have another oncologist assigned to them yet.  The patient denies any abdominal since the symptoms such as feelings of fullness, early satiety, GERD, reflux, abdominal pain, nausea, vomiting, diarrhea. Patient states that he does occasionally have some constipation but otherwise no complaints. Patient is otherwise healthy and has well-controlled hypertension and GERD. Patient denies any fever chills, night sweats, malaise, nausea vomiting diarrhea abdominal pain dysuria or hematuria.  Past Medical History  Diagnosis Date  . GERD (gastroesophageal reflux disease)   Kidney Stones Bcell lymphoma  Past Surgical history: Port placement 2014, port removal 2015 Troy Reynolds Lap cholecystectomy; Right modified radical neck dissection Sept 3014  Family History:  Father had lymphoma;  Aunt had brain cancer    Social History   Social History  . Marital Status: Married    Spouse Name: N/A  . Number of Children: N/A  . Years of Education: N/A   Social History Main Topics  . Smoking status: Never Smoker   . Smokeless tobacco: None  . Alcohol Use: None  . Drug Use: None  . Sexual Activity: Not Asked   Other Topics Concern  . None   Social History Narrative    Current outpatient prescriptions:  .  Cetirizine  HCl 10 MG CAPS, Take by mouth., Disp: , Rfl:  .  losartan-hydrochlorothiazide (HYZAAR) 50-12.5 MG per tablet, TAKE 1 TABLET ONCE A DAY, Disp: , Rfl:  .  pantoprazole (PROTONIX) 40 MG tablet, Take by mouth., Disp: , Rfl:  .  ranitidine (ZANTAC) 150 MG tablet, Take by mouth., Disp: , Rfl:  No Known Allergies     Review of Systems  Constitutional: Negative for fever, activity change and appetite change.  HENT: Negative for congestion and sore throat.   Respiratory: Negative for cough, chest tightness and shortness of breath.   Cardiovascular: Negative for chest pain, palpitations and leg swelling.  Gastrointestinal: Positive for constipation. Negative for nausea, abdominal pain, diarrhea, abdominal distention and anal bleeding.  Genitourinary: Negative for dysuria, hematuria and difficulty urinating.  Musculoskeletal: Negative for joint swelling and arthralgias.  Skin: Negative for color change, pallor, rash and wound.  Neurological: Negative for dizziness, tremors and weakness.  Hematological: Negative for adenopathy. Does not bruise/bleed easily.  Psychiatric/Behavioral: Negative for agitation. The patient is nervous/anxious.   All other systems reviewed and are negative.      Filed Vitals:   05/23/16 0912  BP: 132/81  Pulse: 76  Temp: 98.2 F (36.8 C)    Objective:   Physical Exam  Constitutional: He is oriented to person, place, and time. He appears well-developed and well-nourished. No distress.  HENT:  Head: Normocephalic and atraumatic.  Right Ear: External ear normal.  Left Ear: External ear normal.  Nose: Nose normal.  Mouth/Throat: Oropharynx is clear and moist.  No oropharyngeal exudate.  Eyes: Conjunctivae and EOM are normal. Pupils are equal, round, and reactive to light. No scleral icterus.  Neck: Normal range of motion. Neck supple. No tracheal deviation present. No thyromegaly present.  Well healed right sided scar   Cardiovascular: Normal rate, regular  rhythm and intact distal pulses.   Pulmonary/Chest: Effort normal. No respiratory distress. He has no wheezes. He exhibits no tenderness.  Abdominal: Soft. He exhibits no distension. There is no tenderness. There is no guarding.  Musculoskeletal: Normal range of motion. He exhibits no edema or tenderness.  Neurological: He is alert and oriented to person, place, and time. No cranial nerve deficit.  Skin: Skin is warm and dry. No rash noted. No erythema. No pallor.  Psychiatric: He has a normal mood and affect. Troy behavior is normal. Judgment and thought content normal.  Vitals reviewed.      CBC Latest Ref Rng 01/10/2016 09/12/2015 07/24/2015  WBC 3.8 - 10.6 K/uL 5.5 5.2 5.0  Hemoglobin 13.0 - 18.0 g/dL 16.0 15.8 15.0  Hematocrit 40.0 - 52.0 % 45.3 46.9 43.0  Platelets 150 - 440 K/uL 220 214 201    CMP Latest Ref Rng 01/10/2016 09/12/2015 07/24/2015  Glucose 65 - 99 mg/dL 126(H) 103(H) 105(H)  BUN 6 - 20 mg/dL 16 17 17   Creatinine 0.61 - 1.24 mg/dL 1.01 1.07 0.85  Sodium 135 - 145 mmol/L 135 134(L) 140  Potassium 3.5 - 5.1 mmol/L 3.4(L) 3.8 3.7  Chloride 101 - 111 mmol/L 104 102 106  CO2 22 - 32 mmol/L 26 27 28   Calcium 8.9 - 10.3 mg/dL 8.5(L) 8.0(L) 8.7(L)  Total Protein 6.5 - 8.1 g/dL 7.2 7.2 6.7  Total Bilirubin 0.3 - 1.2 mg/dL 0.6 0.7 0.4  Alkaline Phos 38 - 126 U/L 78 72 74  AST 15 - 41 U/L 22 24 26   ALT 17 - 63 U/L 30 24 23     CT scan/PET: 1. Increase in size and hypermetabolism of an isolated jejunal mesenteric node. This is consistent with disease progression. Evaluation mildly degraded by misregistration/motion between CT and PET images. ----1.9cm and SUV of 7.1 vs 1.4cm and SUV of 5.1 on previous PET scan  2. No new sites of disease identified. 3. Incidental findings, including hiatal hernia, left nephrolithiasis, prostatomegaly, fat containing inguinal hernias Assessment:     65 yr old male with a jejunal lymph node, likely recurrence of B cell lymphoma    Plan:      I personally reviewed the patient's past medical history including Troy previous B-cell lymphoma treatment. I also reviewed Troy laboratory values as above just show some slight hypokalemia but otherwise normal. I personally reviewed Troy CT scan images and PET scans for the past 3 times and noted a proximal jejunal lymph node 1-2 cm in size slightly larger more hypermetabolic on this pass and then from 6 months prior. No other areas were noted. I also personally reviewed the radiology read as above. I also discussed this patient in tumor conference as well. Given the location of the lymph node this would be a difficult area to biopsy via interventional radiology or EUS. I did discuss with this patient as this most likely a recurrence of Troy lymphoma in these lymph nodes given the chronicity and increase in both size and metabolic activity. I did call and speak with Troy Reynolds will be taking over Troy oncology care. I discussed with him whether a biopsy was needed or if chemotherapy could be restarted in this area  monitored. Since he has not met the patient previously he would like to see him in Troy office and review and let me know if he would like tissue biopsy for this area, but he did state this was likely given that some lymphomas can change.   I discussed this with the patient and Troy Reynolds as well letting them know that the appointment with the oncologist would be next week and that they would discuss all of the treatment options at that time of tissue biopsy first, port placement and if further chemotherapy as needed. I did discuss with them that the biopsy would be initial laparoscopic examination of the abdomen and an attempted laparoscopically biopsy the lymph node that appeared to be in the mesentery of the small bowel as well. I did discuss with them the injury to bowel or the blood supply to bowel potentially causing the need for resection of bowel and reconnecting of this area. I also discussed  with them the risk of bleeding, infection, leakage from bowel resection, damage to other structures including bowel and vessels, as well as potential for converting to an open procedure and anesthetic risk of heart attack, stroke, blood clots and death.  Patient and Reynolds were given opportunity to ask questions and have them answered. At this time they would like to discuss with her new oncologist and determine if the biopsy is the option they would like to go with and will call back for an appointment and I will be in conversation with Troy Reynolds as well.

## 2016-05-27 ENCOUNTER — Encounter: Payer: Self-pay | Admitting: Internal Medicine

## 2016-05-27 DIAGNOSIS — C8330 Diffuse large B-cell lymphoma, unspecified site: Secondary | ICD-10-CM | POA: Insufficient documentation

## 2016-05-28 ENCOUNTER — Ambulatory Visit
Admission: RE | Admit: 2016-05-28 | Discharge: 2016-05-28 | Disposition: A | Discharge status: Home / Self Care | Payer: 59 | Source: Ambulatory Visit | Attending: Internal Medicine | Admitting: Internal Medicine

## 2016-05-28 ENCOUNTER — Inpatient Hospital Stay: Payer: 59 | Attending: Internal Medicine | Admitting: Internal Medicine

## 2016-05-28 ENCOUNTER — Other Ambulatory Visit: Payer: Self-pay | Admitting: *Deleted

## 2016-05-28 ENCOUNTER — Ambulatory Visit: Payer: 59

## 2016-05-28 VITALS — BP 126/87 | HR 81 | Temp 98.2°F | Resp 8 | Wt 211.2 lb

## 2016-05-28 DIAGNOSIS — C8330 Diffuse large B-cell lymphoma, unspecified site: Secondary | ICD-10-CM | POA: Diagnosis not present

## 2016-05-28 DIAGNOSIS — Z79899 Other long term (current) drug therapy: Secondary | ICD-10-CM

## 2016-05-28 DIAGNOSIS — R059 Cough, unspecified: Secondary | ICD-10-CM

## 2016-05-28 DIAGNOSIS — K219 Gastro-esophageal reflux disease without esophagitis: Secondary | ICD-10-CM | POA: Insufficient documentation

## 2016-05-28 DIAGNOSIS — Z923 Personal history of irradiation: Secondary | ICD-10-CM | POA: Diagnosis not present

## 2016-05-28 DIAGNOSIS — R05 Cough: Secondary | ICD-10-CM | POA: Diagnosis not present

## 2016-05-28 DIAGNOSIS — Z9221 Personal history of antineoplastic chemotherapy: Secondary | ICD-10-CM | POA: Diagnosis not present

## 2016-05-28 LAB — COMPREHENSIVE METABOLIC PANEL
ALBUMIN: 4.3 g/dL (ref 3.5–5.0)
ALK PHOS: 72 U/L (ref 38–126)
ALT: 29 U/L (ref 17–63)
AST: 22 U/L (ref 15–41)
Anion gap: 8 (ref 5–15)
BUN: 16 mg/dL (ref 6–20)
CALCIUM: 8.9 mg/dL (ref 8.9–10.3)
CHLORIDE: 103 mmol/L (ref 101–111)
CO2: 27 mmol/L (ref 22–32)
CREATININE: 0.89 mg/dL (ref 0.61–1.24)
GFR calc Af Amer: >60 mL/min (ref >60)
GFR calc non Af Amer: >60 mL/min (ref >60)
GLUCOSE: 94 mg/dL (ref 65–99)
Potassium: 3.7 mmol/L (ref 3.5–5.1)
SODIUM: 138 mmol/L (ref 135–145)
Total Bilirubin: 0.4 mg/dL (ref 0.3–1.2)
Total Protein: 7.1 g/dL (ref 6.5–8.1)

## 2016-05-28 LAB — CBC WITH DIFFERENTIAL/PLATELET
BASOS ABS: 0.1 10*3/uL (ref 0–0.1)
Basophils Relative: 1 %
EOS ABS: 0.2 10*3/uL (ref 0–0.7)
EOS PCT: 3 %
HCT: 45.9 % (ref 40.0–52.0)
HEMOGLOBIN: 15.7 g/dL (ref 13.0–18.0)
LYMPHS ABS: 1.2 10*3/uL (ref 1.0–3.6)
LYMPHS PCT: 17 %
MCH: 29.3 pg (ref 26.0–34.0)
MCHC: 34.2 g/dL (ref 32.0–36.0)
MCV: 85.7 fL (ref 80.0–100.0)
Monocytes Absolute: 0.7 10*3/uL (ref 0.2–1.0)
Monocytes Relative: 10 %
NEUTROS PCT: 69 %
Neutro Abs: 4.9 10*3/uL (ref 1.4–6.5)
PLATELETS: 206 10*3/uL (ref 150–440)
RBC: 5.35 MIL/uL (ref 4.40–5.90)
RDW: 13.8 % (ref 11.5–14.5)
WBC: 7.1 10*3/uL (ref 3.8–10.6)

## 2016-05-28 LAB — LACTATE DEHYDROGENASE: LDH: 158 U/L (ref 98–192)

## 2016-05-28 NOTE — Assessment & Plan Note (Signed)
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 Surveillance PET scan shows- jejunal PET avid progressive. Recommend biopsy. Discussed that difficult to get lymph node under CT guidance; recommend laparoscopic. Reviewed with Dr. Azalee Course. Patient will call Dr.Loflin's office for appointment/surgery planning.  #  For now recommend holding the port; until we know the histology of the regional lymph node.  # Patient follow-up with me tentatively in 3 weeks/no labs at the time.   # Cough unclear etiology check labs today/chest x-ray continue Claritin.

## 2016-05-28 NOTE — Progress Notes (Signed)
Patient continues to have a nagging cough that will not go away.  Patient states he saw surgeon and was told they do not biopsy.  Patient sent back to [facility] for plan from this point.

## 2016-05-28 NOTE — Progress Notes (Signed)
[name] OFFICE PROGRESS NOTE  Patient Care Team: Rusty Aus, MD as PCP - General (Internal Medicine) Hubbard Robinson, MD as Consulting Physician (Surgery)  No matching staging information was found for the patient.   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 I reviewed the patient's prior charts/pertinent labs/imaging in detail; findings are summarized above.    Troy Reynolds 65 y.o.  male pleasant patient above history of Diffuse large B-cell lymphoma; noted to have a lymph node in the abdomen/jejunum PET avid over the last few months. He has been evaluated by surgery for surgical/laparoscopic biopsy.  He denies any unusual abdominal pain nausea vomiting. Denies any constipation or diarrhea.  Patient does complain of cough over the last 4-5 weeks no hemoptysis. No fevers or chills. No weight loss or night sweats.  REVIEW OF SYSTEMS:  A complete 10 point review of system is done which is negative except mentioned above/history of present illness.   PAST MEDICAL HISTORY :  Past Medical History  Diagnosis Date  . GERD (gastroesophageal reflux disease)     PAST SURGICAL HISTORY :  No past surgical history on file.  FAMILY HISTORY :  No family history on file.  SOCIAL HISTORY:   Social History  Substance Use Topics  . Smoking status: Never Smoker   . Smokeless tobacco: Not on file  . Alcohol Use: Not on file    ALLERGIES:   has No Known Allergies.  MEDICATIONS:  Current Outpatient Prescriptions  Medication Sig Dispense Refill  . Cetirizine HCl 10 MG CAPS Take by mouth.    . losartan-hydrochlorothiazide (HYZAAR) 50-12.5 MG per tablet TAKE 1 TABLET ONCE A DAY    . pantoprazole (PROTONIX) 40 MG tablet Take by mouth.    . ranitidine (ZANTAC) 150 MG tablet Take by mouth.     No current facility-administered medications for this visit.    PHYSICAL EXAMINATION: ECOG PERFORMANCE STATUS: 0 - Asymptomatic  BP 126/87 mmHg  Pulse 81  Temp(Src) 98.2 F (36.8 C) (Tympanic)  Resp 8  Wt 211 lb 3.2 oz (95.8 kg)  Filed Weights   05/28/16 1601  Weight: 211 lb 3.2 oz (95.8 kg)    GENERAL: Well-nourished well-developed; Alert, no distress and comfortable.  With his wife.  EYES: no pallor or icterus OROPHARYNX: no thrush or ulceration; good dentition  NECK: supple, no masses felt LYMPH:  no palpable lymphadenopathy in the cervical, axillary or inguinal regions LUNGS: clear to auscultation and  No wheeze or crackles HEART/CVS: regular rate & rhythm and no murmurs; No lower extremity edema ABDOMEN:abdomen soft, non-tender and normal bowel sounds Musculoskeletal:no cyanosis of digits and no clubbing  PSYCH: alert & oriented x 3 with fluent speech NEURO: no focal motor/sensory deficits SKIN:  no rashes or significant lesions  LABORATORY DATA:  I have reviewed the data as listed    Component Value Date/Time   NA 135 01/10/2016 1447   NA  140 03/12/2015 1007   K 3.4* 01/10/2016 1447   K 3.7 03/12/2015 1007   CL 104 01/10/2016 1447   CL 105 03/12/2015 1007   CO2 26 01/10/2016 1447   CO2 29 03/12/2015 1007   GLUCOSE 126* 01/10/2016 1447   GLUCOSE 143* 03/12/2015 1007   BUN 16 01/10/2016 1447   BUN 15 03/12/2015 1007   CREATININE 1.01 01/10/2016 1447   CREATININE 0.92 03/12/2015 1007   CALCIUM 8.5* 01/10/2016 1447   CALCIUM 8.6* 03/12/2015 1007   PROT 7.2 01/10/2016 1447   PROT 6.6 03/12/2015 1007    ALBUMIN 4.5 01/10/2016 1447   ALBUMIN 4.0 03/12/2015 1007   AST 22 01/10/2016 1447   AST 26 03/12/2015 1007   ALT 30 01/10/2016 1447   ALT 37 03/12/2015 1007   ALKPHOS 78 01/10/2016 1447   ALKPHOS 79 03/12/2015 1007   BILITOT 0.6 01/10/2016 1447   BILITOT 0.8 03/12/2015 1007   GFRNONAA >60 01/10/2016 1447   GFRNONAA >60 03/12/2015 1007   GFRNONAA >60 11/13/2014 1019   GFRAA >60 01/10/2016 1447   GFRAA >60 03/12/2015 1007   GFRAA >60 11/13/2014 1019    No results found for: SPEP, UPEP  Lab Results  Component Value Date   WBC 7.1 05/28/2016   NEUTROABS 4.9 05/28/2016   HGB 15.7 05/28/2016   HCT 45.9 05/28/2016   MCV 85.7 05/28/2016   PLT 206 05/28/2016      Chemistry      Component Value Date/Time   NA 135 01/10/2016 1447   NA 140 03/12/2015 1007   K 3.4* 01/10/2016 1447   K 3.7 03/12/2015 1007   CL 104 01/10/2016 1447   CL 105 03/12/2015 1007   CO2 26 01/10/2016 1447   CO2 29 03/12/2015 1007   BUN 16 01/10/2016 1447   BUN 15 03/12/2015 1007   CREATININE 1.01 01/10/2016 1447   CREATININE 0.92 03/12/2015 1007      Component Value Date/Time   CALCIUM 8.5* 01/10/2016 1447   CALCIUM 8.6* 03/12/2015 1007   ALKPHOS 78 01/10/2016 1447   ALKPHOS 79 03/12/2015 1007   AST 22 01/10/2016 1447   AST 26 03/12/2015 1007   ALT 30 01/10/2016 1447   ALT 37 03/12/2015 1007   BILITOT 0.6 01/10/2016 1447   BILITOT 0.8 03/12/2015 1007       RADIOGRAPHIC STUDIES: I have personally reviewed the radiological images as listed and agreed with the findings in the report. No results found.   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 Surveillance PET scan shows- jejunal PET avid progressive. Recommend biopsy. Discussed that difficult to get lymph node under CT guidance; recommend laparoscopic. Reviewed with Dr. Azalee Course. Patient will call Dr.Loflin's office for appointment/surgery  planning.  #  For now recommend holding the port; until we know the histology of the regional lymph node.  # Patient follow-up with me tentatively in 3 weeks/no labs at the time.   # Cough unclear etiology check labs today/chest x-ray continue Claritin.    Orders Placed This Encounter  Procedures  . DG Chest 2 View    Standing Status: Future     Number of Occurrences: 1     Standing Expiration Date: 07/28/2017    Order Specific Question:  Reason for Exam (SYMPTOM  OR DIAGNOSIS REQUIRED)    Answer:  cough    Order Specific Question:  Preferred imaging location?  Answer:  [facility]   All questions were answered. The patient knows to call the clinic with any problems, questions or concerns.      Cammie Sickle, MD 05/28/2016 5:26 PM

## 2016-05-29 ENCOUNTER — Telehealth: Payer: Self-pay | Admitting: *Deleted

## 2016-05-29 ENCOUNTER — Telehealth: Payer: Self-pay | Admitting: Surgery

## 2016-05-29 NOTE — Telephone Encounter (Signed)
Called Claritin 10 mg into [facility] on [address].  Patient notified.

## 2016-05-29 NOTE — Telephone Encounter (Signed)
Message out to [place] at this time to schedule Neck Mass Biopsy to confirm suspected diagnosis of Lymphoma.  Message out to Mariea Clonts to see if we can obtain a Navigator for this patient.   Awaiting return notifications from both to help with this process.

## 2016-05-29 NOTE — Telephone Encounter (Signed)
-----   Message from Cammie Sickle, MD sent at 05/28/2016  8:09 PM EDT ----- Please inform Troy Reynolds cxr- clear; recommend claritin once a day; and OTC robitussin; if not improved follow up with PCP- Thx

## 2016-05-29 NOTE — Telephone Encounter (Signed)
Patient was referred to [facility] by Dr Oliva Bustard and was seen in our office with Dr Azalee Course on 6/30. Dr Azalee Course referred patient to Dr Rogue Bussing, whom patient saw on 7/5. Patient is now supposed to schedule another appointment with Dr Azalee Course to schedule laparoscopic biopsy/surgery. Patient and his wife prefer not to make another appointment and be charged another co-pay as they feel this procedure could be scheduled over the phone. They also stated they feel as if they're being bounced around between doctors and are having a hard time keeping up with everything. Can we have the [facility] assist them? And can we talk to Dr Azalee Course and see if it is necessary for patient to come back to the office to schedule surgery or if this can be done over the phone? Thanks

## 2016-05-29 NOTE — Telephone Encounter (Signed)
Called and spoke with patient's wife to inform her that patient's CXR is okay.  Patient should also take Claritin and Robitussin.  If he does not get better he should contact his PCP.

## 2016-05-30 NOTE — Telephone Encounter (Signed)
Per Dr. Azalee Course, Patient does not need seen in clinic prior to scheduling biopsy that patient is requesting. Please schedule for Dr. Azalee Course if possible next week.

## 2016-06-05 ENCOUNTER — Telehealth: Payer: Self-pay | Admitting: Surgery

## 2016-06-05 NOTE — Telephone Encounter (Signed)
Pt advised of pre op date/time and sx date. Sx: 06/19/16 with Dr Quillian Quince laparoscopy, resection of the mesenteric lymph node, possible small bowel resection, possible open.  Pre op: 06/12/16 @ 8:45am--Office.   Patient made aware to call [phone], between 1-3:00pm the day before surgery, to find out what time to arrive.

## 2016-06-12 ENCOUNTER — Other Ambulatory Visit: Payer: Self-pay

## 2016-06-12 ENCOUNTER — Inpatient Hospital Stay: Admission: RE | Admit: 2016-06-12 | Payer: 59 | Source: Ambulatory Visit

## 2016-06-12 ENCOUNTER — Encounter
Admission: RE | Admit: 2016-06-12 | Discharge: 2016-06-12 | Disposition: A | Discharge status: Home / Self Care | Payer: 59 | Source: Ambulatory Visit | Attending: Surgery | Admitting: Surgery

## 2016-06-12 DIAGNOSIS — Z01810 Encounter for preprocedural cardiovascular examination: Secondary | ICD-10-CM | POA: Insufficient documentation

## 2016-06-12 DIAGNOSIS — C8303 Small cell B-cell lymphoma, intra-abdominal lymph nodes: Secondary | ICD-10-CM

## 2016-06-12 DIAGNOSIS — Z01812 Encounter for preprocedural laboratory examination: Secondary | ICD-10-CM | POA: Diagnosis not present

## 2016-06-12 HISTORY — DX: Essential (primary) hypertension: I10

## 2016-06-12 LAB — SURGICAL PCR SCREEN
MRSA, PCR: NEGATIVE
Staphylococcus aureus: NEGATIVE

## 2016-06-12 NOTE — Patient Instructions (Signed)
Your procedure is scheduled on: Thursday 06/19/16 Report to Day Surgery. 2ND FLOOR MEDICAL MALL ENTRANCE To find out your arrival time please call [phone] between 1PM - 3PM on Wednesday 06/18/16.  Remember: Instructions that are not followed completely may result in serious medical risk, up to and including death, or upon the discretion of your surgeon and anesthesiologist your surgery may need to be rescheduled.    __X__ 1. Do not eat food or drink liquids after midnight. No gum chewing or hard candies.     __X__ 2. No Alcohol for 24 hours before or after surgery.   ____ 3. Bring all medications with you on the day of surgery if instructed.    __X__ 4. Notify your doctor if there is any change in your medical condition     (cold, fever, infections).     Do not wear jewelry, make-up, hairpins, clips or nail polish.  Do not wear lotions, powders, or perfumes.   Do not shave 48 hours prior to surgery. Men may shave face and neck.  Do not bring valuables to the hospital.    [facility] is not responsible for any belongings or valuables.               Contacts, dentures or bridgework may not be worn into surgery.  Leave your suitcase in the car. After surgery it may be brought to your room.  For patients admitted to the hospital, discharge time is determined by your                treatment team.   Patients discharged the day of surgery will not be allowed to drive home.   Please read over the following fact sheets that you were given:   MRSA Information and Surgical Site Infection Prevention   __X__ Take these medicines the morning of surgery with A SIP OF WATER:    1. PROTONIX  2. LORATADINE  3. RANITIDINE AT BEDTIME  4.  5.  6.  ____ Fleet Enema (as directed)   __X__ Use CHG Soap as directed  ____ Use inhalers on the day of surgery  ____ Stop metformin 2 days prior to surgery    ____ Take 1/2 of usual insulin dose the night before surgery and none on the morning of  surgery.   ____ Stop Coumadin/Plavix/aspirin on   ____ Stop Anti-inflammatories on    ____ Stop supplements until after surgery.    ____ Bring C-Pap to the hospital.

## 2016-06-18 ENCOUNTER — Inpatient Hospital Stay: Payer: 59 | Admitting: Internal Medicine

## 2016-06-19 ENCOUNTER — Encounter
Admission: RE | Disposition: A | Discharge status: Home / Self Care | Payer: Self-pay | Source: Ambulatory Visit | Attending: Surgery

## 2016-06-19 ENCOUNTER — Inpatient Hospital Stay
Admission: RE | Admit: 2016-06-19 | Discharge: 2016-06-23 | DRG: 825 | Disposition: A | Discharge status: Home / Self Care | Payer: 59 | Source: Ambulatory Visit | Attending: Surgery | Admitting: Surgery

## 2016-06-19 ENCOUNTER — Inpatient Hospital Stay: Payer: 59 | Admitting: Anesthesiology

## 2016-06-19 ENCOUNTER — Encounter: Payer: Self-pay | Admitting: *Deleted

## 2016-06-19 DIAGNOSIS — R591 Generalized enlarged lymph nodes: Secondary | ICD-10-CM | POA: Diagnosis present

## 2016-06-19 DIAGNOSIS — K5010 Crohn's disease of large intestine without complications: Secondary | ICD-10-CM | POA: Diagnosis present

## 2016-06-19 DIAGNOSIS — I10 Essential (primary) hypertension: Secondary | ICD-10-CM | POA: Diagnosis present

## 2016-06-19 DIAGNOSIS — Z87442 Personal history of urinary calculi: Secondary | ICD-10-CM | POA: Diagnosis not present

## 2016-06-19 DIAGNOSIS — Z9221 Personal history of antineoplastic chemotherapy: Secondary | ICD-10-CM | POA: Diagnosis not present

## 2016-06-19 DIAGNOSIS — Z5331 Laparoscopic surgical procedure converted to open procedure: Secondary | ICD-10-CM

## 2016-06-19 DIAGNOSIS — K219 Gastro-esophageal reflux disease without esophagitis: Secondary | ICD-10-CM | POA: Diagnosis present

## 2016-06-19 DIAGNOSIS — R590 Localized enlarged lymph nodes: Secondary | ICD-10-CM | POA: Diagnosis present

## 2016-06-19 DIAGNOSIS — C8303 Small cell B-cell lymphoma, intra-abdominal lymph nodes: Principal | ICD-10-CM | POA: Diagnosis present

## 2016-06-19 DIAGNOSIS — C8510 Unspecified B-cell lymphoma, unspecified site: Secondary | ICD-10-CM | POA: Diagnosis present

## 2016-06-19 HISTORY — PX: BOWEL RESECTION: SHX1257

## 2016-06-19 HISTORY — PX: LAPAROSCOPIC REMOVAL OF MESENTERIC MASS: SHX5917

## 2016-06-19 SURGERY — EXCISION, MASS, MESENTERIC, LAPAROSCOPIC
Anesthesia: General | Wound class: Clean Contaminated

## 2016-06-19 MED ORDER — PROPOFOL 10 MG/ML IV BOLUS
INTRAVENOUS | Status: DC | PRN
  Administered 2016-06-19: 150 mg via INTRAVENOUS

## 2016-06-19 MED ORDER — BUPIVACAINE HCL (PF) 0.5 % IJ SOLN
INTRAMUSCULAR | Status: AC
  Filled 2016-06-19: qty 30

## 2016-06-19 MED ORDER — CHLORHEXIDINE GLUCONATE CLOTH 2 % EX PADS: 6.0000 | MEDICATED_PAD | Freq: Once | CUTANEOUS | Status: DC

## 2016-06-19 MED ORDER — ENOXAPARIN SODIUM 40 MG/0.4ML ~~LOC~~ SOLN
40.0000 mg | SUBCUTANEOUS | Status: DC
  Administered 2016-06-20 – 2016-06-23 (×4): 40 mg via SUBCUTANEOUS
  Filled 2016-06-19 (×4): qty 0.4

## 2016-06-19 MED ORDER — FENTANYL CITRATE (PF) 100 MCG/2ML IJ SOLN
25.0000 ug | INTRAMUSCULAR | Status: AC | PRN
  Administered 2016-06-19 (×6): 25 ug via INTRAVENOUS

## 2016-06-19 MED ORDER — FENTANYL CITRATE (PF) 100 MCG/2ML IJ SOLN
INTRAMUSCULAR | Status: DC | PRN
  Administered 2016-06-19 (×2): 50 ug via INTRAVENOUS

## 2016-06-19 MED ORDER — ONDANSETRON HCL 4 MG/2ML IJ SOLN
4.0000 mg | Freq: Four times a day (QID) | INTRAMUSCULAR | Status: DC | PRN
  Administered 2016-06-19: 4 mg via INTRAVENOUS
  Filled 2016-06-19: qty 2

## 2016-06-19 MED ORDER — SUCCINYLCHOLINE CHLORIDE 20 MG/ML IJ SOLN
INTRAMUSCULAR | Status: DC | PRN
  Administered 2016-06-19: 80 mg via INTRAVENOUS

## 2016-06-19 MED ORDER — DEXTROSE IN LACTATED RINGERS 5 % IV SOLN
INTRAVENOUS | Status: DC
  Administered 2016-06-19: 1000 mL via INTRAVENOUS
  Administered 2016-06-19 – 2016-06-20 (×2): via INTRAVENOUS
  Administered 2016-06-20: 1000 mL via INTRAVENOUS
  Administered 2016-06-20 – 2016-06-23 (×7): via INTRAVENOUS

## 2016-06-19 MED ORDER — ACETAMINOPHEN 10 MG/ML IV SOLN
INTRAVENOUS | Status: AC
  Filled 2016-06-19: qty 100

## 2016-06-19 MED ORDER — ONDANSETRON HCL 4 MG/2ML IJ SOLN: 4.0000 mg | Freq: Once | INTRAMUSCULAR | Status: DC | PRN

## 2016-06-19 MED ORDER — HYDROCHLOROTHIAZIDE 12.5 MG PO CAPS
12.5000 mg | ORAL_CAPSULE | Freq: Every day | ORAL | Status: DC
  Administered 2016-06-20 – 2016-06-23 (×4): 12.5 mg via ORAL
  Filled 2016-06-19 (×4): qty 1

## 2016-06-19 MED ORDER — LACTATED RINGERS IV SOLN
INTRAVENOUS | Status: DC
  Administered 2016-06-19 (×2): via INTRAVENOUS

## 2016-06-19 MED ORDER — PANTOPRAZOLE SODIUM 40 MG PO TBEC
40.0000 mg | DELAYED_RELEASE_TABLET | Freq: Every day | ORAL | Status: DC
  Administered 2016-06-20 – 2016-06-23 (×4): 40 mg via ORAL
  Filled 2016-06-19 (×4): qty 1

## 2016-06-19 MED ORDER — FENTANYL CITRATE (PF) 100 MCG/2ML IJ SOLN
INTRAMUSCULAR | Status: AC
  Administered 2016-06-19: 25 ug via INTRAVENOUS
  Filled 2016-06-19: qty 2

## 2016-06-19 MED ORDER — MORPHINE SULFATE (PF) 2 MG/ML IV SOLN
2.0000 mg | INTRAVENOUS | Status: DC | PRN
  Administered 2016-06-19 (×2): 2 mg via INTRAVENOUS
  Filled 2016-06-19 (×2): qty 1

## 2016-06-19 MED ORDER — ROCURONIUM BROMIDE 100 MG/10ML IV SOLN
INTRAVENOUS | Status: DC | PRN
  Administered 2016-06-19 (×2): 10 mg via INTRAVENOUS
  Administered 2016-06-19: 25 mg via INTRAVENOUS
  Administered 2016-06-19 (×2): 10 mg via INTRAVENOUS

## 2016-06-19 MED ORDER — BUPIVACAINE HCL (PF) 0.25 % IJ SOLN
INTRAMUSCULAR | Status: DC | PRN
  Administered 2016-06-19: 5 mL

## 2016-06-19 MED ORDER — ALUM & MAG HYDROXIDE-SIMETH 200-200-20 MG/5ML PO SUSP
30.0000 mL | Freq: Once | ORAL | Status: AC
  Administered 2016-06-19: 30 mL via ORAL
  Filled 2016-06-19: qty 30

## 2016-06-19 MED ORDER — LORATADINE 10 MG PO TABS
10.0000 mg | ORAL_TABLET | Freq: Every day | ORAL | Status: DC
  Administered 2016-06-20 – 2016-06-23 (×4): 10 mg via ORAL
  Filled 2016-06-19 (×4): qty 1

## 2016-06-19 MED ORDER — ACETAMINOPHEN 10 MG/ML IV SOLN
INTRAVENOUS | Status: DC | PRN
  Administered 2016-06-19: 1000 mg via INTRAVENOUS

## 2016-06-19 MED ORDER — SUGAMMADEX SODIUM 200 MG/2ML IV SOLN
INTRAVENOUS | Status: DC | PRN
  Administered 2016-06-19: 190.6 mg via INTRAVENOUS

## 2016-06-19 MED ORDER — ACETAMINOPHEN 500 MG PO TABS
1000.0000 mg | ORAL_TABLET | Freq: Four times a day (QID) | ORAL | Status: DC
  Administered 2016-06-19 – 2016-06-23 (×13): 1000 mg via ORAL
  Filled 2016-06-19 (×15): qty 2

## 2016-06-19 MED ORDER — KETOROLAC TROMETHAMINE 30 MG/ML IJ SOLN
30.0000 mg | Freq: Four times a day (QID) | INTRAMUSCULAR | Status: DC
  Administered 2016-06-19 – 2016-06-23 (×15): 30 mg via INTRAVENOUS
  Filled 2016-06-19 (×15): qty 1

## 2016-06-19 MED ORDER — MIDAZOLAM HCL 2 MG/2ML IJ SOLN
INTRAMUSCULAR | Status: DC | PRN
  Administered 2016-06-19: 2 mg via INTRAVENOUS

## 2016-06-19 MED ORDER — LOSARTAN POTASSIUM-HCTZ 50-12.5 MG PO TABS: 1.0000 | ORAL_TABLET | Freq: Every day | ORAL | Status: DC

## 2016-06-19 MED ORDER — ONDANSETRON 4 MG PO TBDP: 4.0000 mg | ORAL_TABLET | Freq: Four times a day (QID) | ORAL | Status: DC | PRN

## 2016-06-19 MED ORDER — CYCLOBENZAPRINE HCL 10 MG PO TABS
10.0000 mg | ORAL_TABLET | Freq: Three times a day (TID) | ORAL | Status: DC
  Administered 2016-06-19 – 2016-06-23 (×11): 10 mg via ORAL
  Filled 2016-06-19 (×11): qty 1

## 2016-06-19 MED ORDER — HYDROMORPHONE HCL 1 MG/ML IJ SOLN
INTRAMUSCULAR | Status: DC | PRN
  Administered 2016-06-19: .8 mg via INTRAVENOUS
  Administered 2016-06-19: .4 mg via INTRAVENOUS

## 2016-06-19 MED ORDER — FLUTICASONE PROPIONATE 50 MCG/ACT NA SUSP
1.0000 | Freq: Every day | NASAL | Status: DC | PRN
  Filled 2016-06-19: qty 16

## 2016-06-19 MED ORDER — HYDRALAZINE HCL 20 MG/ML IJ SOLN: 10.0000 mg | INTRAMUSCULAR | Status: DC | PRN

## 2016-06-19 MED ORDER — OXYCODONE HCL 5 MG PO TABS
5.0000 mg | ORAL_TABLET | ORAL | Status: DC | PRN
  Administered 2016-06-23: 10 mg via ORAL
  Filled 2016-06-19: qty 2

## 2016-06-19 MED ORDER — LOSARTAN POTASSIUM 50 MG PO TABS
50.0000 mg | ORAL_TABLET | Freq: Every day | ORAL | Status: DC
  Administered 2016-06-20 – 2016-06-23 (×4): 50 mg via ORAL
  Filled 2016-06-19 (×4): qty 1

## 2016-06-19 MED ORDER — LIDOCAINE HCL (CARDIAC) 20 MG/ML IV SOLN
INTRAVENOUS | Status: DC | PRN
  Administered 2016-06-19: 100 mg via INTRAVENOUS

## 2016-06-19 MED ORDER — CEFAZOLIN SODIUM 1 G IJ SOLR
INTRAMUSCULAR | Status: DC | PRN
  Administered 2016-06-19: 2 g via INTRAMUSCULAR

## 2016-06-19 MED ORDER — ONDANSETRON HCL 4 MG/2ML IJ SOLN
INTRAMUSCULAR | Status: DC | PRN
  Administered 2016-06-19: 4 mg via INTRAVENOUS

## 2016-06-19 MED ORDER — ERTAPENEM SODIUM 1 G IJ SOLR
1.0000 g | Freq: Once | INTRAMUSCULAR | Status: AC
  Administered 2016-06-19: 1 g via INTRAVENOUS
  Filled 2016-06-19: qty 1

## 2016-06-19 SURGICAL SUPPLY — 75 items
APPLIER CLIP 5 13 M/L LIGAMAX5 (MISCELLANEOUS)
BLADE SURG SZ10 CARB STEEL (BLADE) IMPLANT
CANISTER SUCT 1200ML W/VALVE (MISCELLANEOUS) ×2 IMPLANT
CATH TRAY 16F METER LATEX (MISCELLANEOUS) IMPLANT
CHLORAPREP W/TINT 26ML (MISCELLANEOUS) ×2 IMPLANT
CLIP APPLIE 5 13 M/L LIGAMAX5 (MISCELLANEOUS) IMPLANT
CNTNR SPEC 2.5X3XGRAD LEK (MISCELLANEOUS) ×1
CONT SPEC 4OZ STER OR WHT (MISCELLANEOUS) ×1
CONTAINER SPEC 2.5X3XGRAD LEK (MISCELLANEOUS) ×1 IMPLANT
DRAPE LAPAROTOMY 100X77 ABD (DRAPES) IMPLANT
DRAPE LEGGINS SURG 28X43 STRL (DRAPES) IMPLANT
DRAPE PERI LITHO V/GYN (MISCELLANEOUS) IMPLANT
DRAPE UNDER BUTTOCK W/FLU (DRAPES) IMPLANT
DRAPE UTILITY 15X26 TOWEL STRL (DRAPES) IMPLANT
DRESSING TELFA 4X3 1S ST N-ADH (GAUZE/BANDAGES/DRESSINGS) ×2 IMPLANT
DRSG OPSITE POSTOP 4X10 (GAUZE/BANDAGES/DRESSINGS) ×2 IMPLANT
DRSG OPSITE POSTOP 4X12 (GAUZE/BANDAGES/DRESSINGS) IMPLANT
DRSG TEGADERM 2-3/8X2-3/4 SM (GAUZE/BANDAGES/DRESSINGS) ×4 IMPLANT
ELECT CAUTERY BLADE 6.4 (BLADE) ×2 IMPLANT
ELECT REM PT RETURN 9FT ADLT (ELECTROSURGICAL) ×2
ELECTRODE REM PT RTRN 9FT ADLT (ELECTROSURGICAL) ×1 IMPLANT
GAUZE SPONGE NON-WVN 2X2 STRL (MISCELLANEOUS) ×2 IMPLANT
GLOVE PI ORTHOPRO 6.5 (GLOVE) ×2
GLOVE PI ORTHOPRO STRL 6.5 (GLOVE) ×2 IMPLANT
GOWN STRL REUS W/ TWL LRG LVL3 (GOWN DISPOSABLE) ×3 IMPLANT
GOWN STRL REUS W/TWL LRG LVL3 (GOWN DISPOSABLE) ×3
GRASPER SUT TROCAR 14GX15 (MISCELLANEOUS) IMPLANT
HANDLE YANKAUER SUCT BULB TIP (MISCELLANEOUS) IMPLANT
IRRIGATION STRYKERFLOW (MISCELLANEOUS) IMPLANT
IRRIGATOR STRYKERFLOW (MISCELLANEOUS)
IV NS 1000ML (IV SOLUTION) ×1
IV NS 1000ML BAXH (IV SOLUTION) ×1 IMPLANT
KIT RM TURNOVER CYSTO AR (KITS) ×2 IMPLANT
KIT RM TURNOVER STRD PROC AR (KITS) ×2 IMPLANT
LABEL OR SOLS (LABEL) ×2 IMPLANT
LIGASURE IMPACT 36 18CM CVD LR (INSTRUMENTS) IMPLANT
LIQUID BAND (GAUZE/BANDAGES/DRESSINGS) IMPLANT
NDL SAFETY 22GX1.5 (NEEDLE) ×2 IMPLANT
NS IRRIG 1000ML POUR BTL (IV SOLUTION) IMPLANT
NS IRRIG 500ML POUR BTL (IV SOLUTION) ×2 IMPLANT
PACK BASIN MAJOR ARMC (MISCELLANEOUS) IMPLANT
PACK COLON CLEAN CLOSURE (MISCELLANEOUS) IMPLANT
PACK LAP CHOLECYSTECTOMY (MISCELLANEOUS) ×2 IMPLANT
PENCIL ELECTRO HAND CTR (MISCELLANEOUS) ×4 IMPLANT
RELOAD LINEAR CUT PROX 55 BLUE (ENDOMECHANICALS) ×10 IMPLANT
RELOAD STAPLER BLUE 60MM (STAPLE) IMPLANT
RETRACTOR WOUND ALXS 18CM SML (MISCELLANEOUS) ×1 IMPLANT
RTRCTR WOUND ALEXIS O 18CM SML (MISCELLANEOUS) ×2
SEALER TISSUE G2 STRG ARTC 35C (ENDOMECHANICALS) IMPLANT
SHEARS HARMONIC ACE PLUS 36CM (ENDOMECHANICALS) IMPLANT
SLEEVE ENDOPATH XCEL 5M (ENDOMECHANICALS) ×2 IMPLANT
SOL PREP PVP 2OZ (MISCELLANEOUS)
SOLUTION PREP PVP 2OZ (MISCELLANEOUS) IMPLANT
SPONGE LAP 18X18 5 PK (GAUZE/BANDAGES/DRESSINGS) ×4 IMPLANT
SPONGE LAP 18X36 2PK (MISCELLANEOUS) ×2 IMPLANT
SPONGE VERSALON 2X2 STRL (MISCELLANEOUS) ×2
STAPLE ECHEON FLEX 60 POW ENDO (STAPLE) IMPLANT
STAPLER PROXIMATE 55 BLUE (STAPLE) ×2 IMPLANT
STAPLER RELOAD BLUE 60MM (STAPLE)
STAPLER SKIN PROX 35W (STAPLE) ×2 IMPLANT
SUT MNCRL 4-0 (SUTURE) ×1
SUT MNCRL 4-0 27XMFL (SUTURE) ×1
SUT MNCRL AB 4-0 PS2 18 (SUTURE) IMPLANT
SUT NYLON 2-0 (SUTURE) IMPLANT
SUT PDS 2-0 27IN (SUTURE) ×4 IMPLANT
SUT PDS AB 1 TP1 96 (SUTURE) IMPLANT
SUT SILK 3-0 (SUTURE) ×4 IMPLANT
SUT VIC AB 2-0 BRD 54 (SUTURE) ×4 IMPLANT
SUT VICRYL 0 AB UR-6 (SUTURE) IMPLANT
SUT VICRYL+ 3-0 144IN (SUTURE) IMPLANT
SUTURE MNCRL 4-0 27XMF (SUTURE) ×1 IMPLANT
TROCAR XCEL 12X100 BLDLESS (ENDOMECHANICALS) ×2 IMPLANT
TROCAR XCEL BLUNT TIP 100MML (ENDOMECHANICALS) IMPLANT
TROCAR XCEL NON-BLD 5MMX100MML (ENDOMECHANICALS) ×2 IMPLANT
TUBING INSUFFLATOR HEATED (MISCELLANEOUS) ×2 IMPLANT

## 2016-06-19 NOTE — Anesthesia Postprocedure Evaluation (Signed)
Anesthesia Post Note  Patient: Troy Reynolds  Procedure(s) Performed: Procedure(s) (LRB): LAPAROSCOPIC REMOVAL OF MESENTERIC MASS (N/A) SMALL BOWEL RESECTION (N/A)  Patient location during evaluation: PACU Anesthesia Type: General Level of consciousness: awake and alert Pain management: pain level controlled Vital Signs Assessment: post-procedure vital signs reviewed and stable Respiratory status: spontaneous breathing, nonlabored ventilation, respiratory function stable and patient connected to nasal cannula oxygen Cardiovascular status: blood pressure returned to baseline and stable Postop Assessment: no signs of nausea or vomiting Anesthetic complications: no    Last Vitals:  Vitals:   06/19/16 1527 06/19/16 2030  BP: (!) 151/90 127/84  Pulse: 74 99  Resp: 18 20  Temp: 36.4 C 36.8 C    Last Pain:  Vitals:   06/19/16 2100  TempSrc:   PainSc: [place]

## 2016-06-19 NOTE — Transfer of Care (Signed)
Immediate Anesthesia Transfer of Care Note  Patient: Troy Reynolds  Procedure(s) Performed: Procedure(s): LAPAROSCOPIC REMOVAL OF MESENTERIC MASS (N/A) SMALL BOWEL RESECTION (N/A)  Patient Location: PACU  Anesthesia Type:General  Level of Consciousness: sedated  Airway & Oxygen Therapy: Patient Spontanous Breathing and Patient connected to face mask oxygen  Post-op Assessment: Report given to RN and Post -op Vital signs reviewed and stable  Post vital signs: Reviewed and stable  Last Vitals:  Vitals:   06/19/16 0802  BP: 136/87  Pulse: 77  Resp: 18  Temp: 36.8 C    Last Pain:  Vitals:   06/19/16 0802  TempSrc: Oral         Complications: No apparent anesthesia complications

## 2016-06-19 NOTE — Anesthesia Procedure Notes (Signed)
Procedure Name: Intubation Date/Time: 06/19/2016 9:11 AM Performed by: [facility] Pre-anesthesia Checklist: Patient identified, Emergency Drugs available, Suction available, Patient being monitored and Timeout performed Patient Re-evaluated:Patient Re-evaluated prior to inductionOxygen Delivery Method: Circle system utilized Preoxygenation: Pre-oxygenation with 100% oxygen Intubation Type: IV induction Ventilation: Mask ventilation without difficulty Laryngoscope Size: Mac and 3 Grade View: Grade I Tube type: Oral Tube size: 7.0 mm Number of attempts: 1 Airway Equipment and Method: Stylet Placement Confirmation: ETT inserted through vocal cords under direct vision,  positive ETCO2 and breath sounds checked- equal and bilateral Secured at: 21 cm Tube secured with: Tape Difficulty Due To: Difficulty was anticipated and Difficult Airway- due to anterior larynx Future Recommendations: Recommend- induction with short-acting agent, and alternative techniques readily available

## 2016-06-19 NOTE — Interval H&P Note (Signed)
History and Physical Interval Note:  06/19/2016 8:21 AM  Troy Reynolds  has presented today for surgery, with the diagnosis of SMALL B CELL LYMPHOMA OF LYMPH NODE OF NECK.  The various methods of treatment have been discussed with the patient and family. After consideration of risks, benefits and other options for treatment, the patient has consented to  Procedure(s): LAPAROSCOPIC REMOVAL OF MESENTERIC MASS (N/A) SMALL BOWEL RESECTION (N/A) as a surgical intervention .  The patient's history has been reviewed, patient examined, no change in status, stable for surgery.  I have reviewed the patient's chart and labs.  Questions were answered to the patient's satisfaction.     Dailah Opperman L Blayke Pinera

## 2016-06-19 NOTE — Op Note (Signed)
Diagnostic laparoscopy; conversion to open celiotomy, small bowel resection x2 and mesenteric biopsy x 2 Procedure Note  Indications: The patient has a history of small b cell lymphoma, he has an area in the proximal jejunal mesentery seen on CT and PET scan that are concerning for lymphoma recurrence, oncology has requested tissue biopsy for diagnosis.  Pre-operative Diagnosis: 1. Jejunal mesenteric lymphadenopathy  2.  Hx of Small B cell lymphoma  Post-operative Diagnosis: 1. Jejunal mesenteric lymphadenopathy 2. Ileal lesion   Surgeon: Hubbard Robinson   Assistants: Marta Lamas, MD  Anesthesia: General endotracheal anesthesia  ASA Class: 2  Procedure Details  The patient was seen in the Holding Room. The risks, benefits, complications, treatment options, and expected outcomes were discussed with the patient. The possibilities of reaction to medication, pulmonary aspiration, perforation of viscus, bleeding, recurrent infection, the need for additional procedures, failure to diagnose a condition, and creating a complication requiring transfusion or operation were discussed with the patient. The patient concurred with the proposed plan, giving informed consent.  The site of surgery properly noted/marked. The patient was taken to the operating room, identified as Pauline Good and the procedure verified as Diagnostic laparoscopy, possible open, possible bowel resection. A Time Out was held and the above information confirmed.   The patient was placed supine after induction of a general anesthetic, the abdomen was prepped and draped in the standard fashion. Marcaine was used to inject into the skin along the umbilicus. A 15 blade scalpel was then used to make an incision along the previous scar site at the umbilicus. Bovie electric cautery was used to dissect down to subcutaneous tissues. To cover clamps were then used to elevate the anterior abdominal fascia this was incised and the abdomen  was entered. 0 Vicryl in 2 figure-of-eight sutures were then placed on either side of the abdominal fascia and a 12 mm on port was placed.  The abdomen was then insufflated not to exceed 18 mmHg. At this point two 5 mm trochars were placed in the left lower quadrant and the midline lower abdomen direct visualization.  The abdomen was then inspected and the omentum was seen to be adhesed to the anterior abdominal wall and this was bluntly dissected down with graspers. The omentum was then elevated up to reveal the transverse colon beneath. Transverse colon was then elevated ligament of Treitz was identified. Just adjacent to the ligament of Treitz there was a small area in the mesentery that appeared soft and like a mesenteric lymph node next to this portion of the bowel. The bowel was then run and approximately 12 cm away was a portion of small bowel with 2 of the same lymph node appearing white areas approximately 2 cm apart from each other. The bowel was continued to be run and at approximately 20 cm away from the ligament of Treitz a thickened area of mesentery could be seen which appeared to have a mass beneath. 30 cm the mesentery had a abnormal white and thickened area. All these abnormal areas the were documented with pictures. Of note as well the first 40 cm of small bowel was thickened with increased vasculature and with a slightly inflamed appearance.    Due to the proximity of the lymph nodes to the bowel is felt that this could not be safely removed laparoscopically and the procedure was converted to open.  A vertical midline incision was created. The abdominal cavity was entered.  The bowel was then eviscerated through the incision and  a GIA stapler was used to remove the 4 cm portion of small bowel. In the mesentery was resected by clamping cutting and tying with 2-0 Vicryl suture. The portions of the masses in question were removed from the small bowel and sent for frozen section.  These were  returned as normal small bowel mucosa.  The small bowel was then reanastomosed using a GIA stapler. I also considered then used to offset the staple loads and a second firing of the GIA stapler across the enterotomy. 3-0 silk sutures were then used to imbricate the corners of the anastomotic site.  Attention was then turned to the mesentery where the mass is were palpable beneath the fatty layer however were not hard but were more firm than typical mesentery. Bovie electric cautery was used to dissect down through the mesentery and a 2 cm portion of this fatty material was then sent for frozen section. There was lymphoid tissue in this specimen per pathology. The area of the mesentery at about 30 cm that had a whitish color and was more thickened was examined and a small 2 cm portion of this was resected with the electrocautery.   Is also sent to pathology for frozen section and showed some fibrotic and lymphoid tissue.  At this time the remainder of the small bowel was then run and was otherwise normal except for a 2 cm white firm mass on the antimesenteric border of the distal ileum. This area was approximately 3 feet away from the ileocecal valve and the bowel appeared to be normal no inflammation or increased vascularity of the above bowel.  This area was then resected by making 2 small enterotomies just distal and proximal to this lesion. GIA staplers and placed inside a side-to-side anastomosis was performed. There is good hemostasis and good wide anastomosis. The staple was without offset using Allis clamps and another staple load was fired across to close the enterotomy. The portion of small bowel was then resected using Kelly clamps off of the mesentery. The ileal tumor is also sent down to pathology for frozen section with the result of spindle cells.  The small bowel was then run again noting to good anastomosis one in the proximal jejunum and one in the distal ileum. Again noted are still some  inflamed small bowel with hypervascularity in the proximal jejunum normal bowel in the ileum. There were no lesions on the liver and stomach appeared normal and no lesions palpable on the colon. The omentum was then placed over the small bowel. The anterior abdominal fascia was then closed using 2-0 Prolene in a continuous running fashion with small stitch technique one from superior and one from inferior. These were then tied down. The abdominal wall was then irrigated with saline and the incision was then closed using staples. The previous 5 mm port sites were then closed using 4-0 Monocryl in a subcuticular interrupted suture.  Sterile dressings were then applied.  Instrument, sponge, and needle counts were correct prior to abdominal closure and at the conclusion of the case.   Findings: Inflamed hypervascular proximal jejunum with small masses in the proximal jejunum determined to be small bowel mucosa, two separate mesenteric biopsies with fibrotic and lymphoid tissue, ileal bowel resection with tumor with spindle cells present.   Estimated Blood Loss:  75cc         Drains: none         Total IV Fluids: 1066ml         Specimens: proximall  jejunum, mesenteric biopsy #1 and #2, and ileal bowel with tumor         Implants: none         Complications:  None; patient tolerated the procedure well.         Disposition: PACU - hemodynamically stable.         Condition: stable

## 2016-06-19 NOTE — H&P (View-Only) (Signed)
Subjective:     Patient ID: Troy Reynolds, male   DOB: 1951-05-24, 65 y.o.   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 Patient did have this and neck dissection performed in 2014 and had treatment and noted previously been in regression. This patient was discussed at tumor board conference between the pathologist radiologist and the oncologist Dr. COliva Bustard  The patient had a PET scan last year which did show a small jejunal lymph node that was hypermetabolic. The patient again had another PET scan this year which showed the same area as hypermetabolic and was slightly larger. Unfortunately Dr. COliva Bustardhas now retired and patient does not have another oncologist assigned to them yet.  The patient denies any abdominal since the symptoms such as feelings of fullness, early satiety, GERD, reflux, abdominal pain, nausea, vomiting, diarrhea. Patient states that he does occasionally have some constipation but otherwise no complaints. Patient is otherwise healthy and has well-controlled hypertension and GERD. Patient denies any fever chills, night sweats, malaise, nausea vomiting diarrhea abdominal pain dysuria or hematuria.  Past Medical History  Diagnosis Date  . GERD (gastroesophageal reflux disease)   Kidney Stones Bcell lymphoma  Past Surgical history: Port placement 2014, port removal 2015 Dr. LRexene Edison Lap cholecystectomy; Right modified radical neck dissection Sept 3014  Family History:  Father had lymphoma;  Aunt had brain cancer    Social History   Social History  . Marital Status: Married    Spouse Name: N/A  . Number of Children: N/A  . Years of Education: N/A   Social History Main Topics  . Smoking status: Never Smoker   . Smokeless tobacco: None  . Alcohol Use: None  . Drug Use: None  . Sexual Activity: Not Asked   Other Topics Concern  . None   Social History Narrative    Current outpatient prescriptions:  .  Cetirizine  HCl 10 MG CAPS, Take by mouth., Disp: , Rfl:  .  losartan-hydrochlorothiazide (HYZAAR) 50-12.5 MG per tablet, TAKE 1 TABLET ONCE A DAY, Disp: , Rfl:  .  pantoprazole (PROTONIX) 40 MG tablet, Take by mouth., Disp: , Rfl:  .  ranitidine (ZANTAC) 150 MG tablet, Take by mouth., Disp: , Rfl:  No Known Allergies     Review of Systems  Constitutional: Negative for fever, activity change and appetite change.  HENT: Negative for congestion and sore throat.   Respiratory: Negative for cough, chest tightness and shortness of breath.   Cardiovascular: Negative for chest pain, palpitations and leg swelling.  Gastrointestinal: Positive for constipation. Negative for nausea, abdominal pain, diarrhea, abdominal distention and anal bleeding.  Genitourinary: Negative for dysuria, hematuria and difficulty urinating.  Musculoskeletal: Negative for joint swelling and arthralgias.  Skin: Negative for color change, pallor, rash and wound.  Neurological: Negative for dizziness, tremors and weakness.  Hematological: Negative for adenopathy. Does not bruise/bleed easily.  Psychiatric/Behavioral: Negative for agitation. The patient is nervous/anxious.   All other systems reviewed and are negative.      Filed Vitals:   05/23/16 0912  BP: 132/81  Pulse: 76  Temp: 98.2 F (36.8 C)    Objective:   Physical Exam  Constitutional: He is oriented to person, place, and time. He appears well-developed and well-nourished. No distress.  HENT:  Head: Normocephalic and atraumatic.  Right Ear: External ear normal.  Left Ear: External ear normal.  Nose: Nose normal.  Mouth/Throat: Oropharynx is clear and moist.  No oropharyngeal exudate.  Eyes: Conjunctivae and EOM are normal. Pupils are equal, round, and reactive to light. No scleral icterus.  Neck: Normal range of motion. Neck supple. No tracheal deviation present. No thyromegaly present.  Well healed right sided scar   Cardiovascular: Normal rate, regular  rhythm and intact distal pulses.   Pulmonary/Chest: Effort normal. No respiratory distress. He has no wheezes. He exhibits no tenderness.  Abdominal: Soft. He exhibits no distension. There is no tenderness. There is no guarding.  Musculoskeletal: Normal range of motion. He exhibits no edema or tenderness.  Neurological: He is alert and oriented to person, place, and time. No cranial nerve deficit.  Skin: Skin is warm and dry. No rash noted. No erythema. No pallor.  Psychiatric: He has a normal mood and affect. His behavior is normal. Judgment and thought content normal.  Vitals reviewed.      CBC Latest Ref Rng 01/10/2016 09/12/2015 07/24/2015  WBC 3.8 - 10.6 K/uL 5.5 5.2 5.0  Hemoglobin 13.0 - 18.0 g/dL 16.0 15.8 15.0  Hematocrit 40.0 - 52.0 % 45.3 46.9 43.0  Platelets 150 - 440 K/uL 220 214 201    CMP Latest Ref Rng 01/10/2016 09/12/2015 07/24/2015  Glucose 65 - 99 mg/dL 126(H) 103(H) 105(H)  BUN 6 - 20 mg/dL 16 17 17   Creatinine 0.61 - 1.24 mg/dL 1.01 1.07 0.85  Sodium 135 - 145 mmol/L 135 134(L) 140  Potassium 3.5 - 5.1 mmol/L 3.4(L) 3.8 3.7  Chloride 101 - 111 mmol/L 104 102 106  CO2 22 - 32 mmol/L 26 27 28   Calcium 8.9 - 10.3 mg/dL 8.5(L) 8.0(L) 8.7(L)  Total Protein 6.5 - 8.1 g/dL 7.2 7.2 6.7  Total Bilirubin 0.3 - 1.2 mg/dL 0.6 0.7 0.4  Alkaline Phos 38 - 126 U/L 78 72 74  AST 15 - 41 U/L 22 24 26   ALT 17 - 63 U/L 30 24 23     CT scan/PET: 1. Increase in size and hypermetabolism of an isolated jejunal mesenteric node. This is consistent with disease progression. Evaluation mildly degraded by misregistration/motion between CT and PET images. ----1.9cm and SUV of 7.1 vs 1.4cm and SUV of 5.1 on previous PET scan  2. No new sites of disease identified. 3. Incidental findings, including hiatal hernia, left nephrolithiasis, prostatomegaly, fat containing inguinal hernias Assessment:     65 yr old male with a jejunal lymph node, likely recurrence of B cell lymphoma    Plan:      I personally reviewed the patient's past medical history including his previous B-cell lymphoma treatment. I also reviewed his laboratory values as above just show some slight hypokalemia but otherwise normal. I personally reviewed his CT scan images and PET scans for the past 3 times and noted a proximal jejunal lymph node 1-2 cm in size slightly larger more hypermetabolic on this pass and then from 6 months prior. No other areas were noted. I also personally reviewed the radiology read as above. I also discussed this patient in tumor conference as well. Given the location of the lymph node this would be a difficult area to biopsy via interventional radiology or EUS. I did discuss with this patient as this most likely a recurrence of his lymphoma in these lymph nodes given the chronicity and increase in both size and metabolic activity. I did call and speak with Dr. Rogue Bussing will be taking over his oncology care. I discussed with him whether a biopsy was needed or if chemotherapy could be restarted in this area  monitored. Since he has not met the patient previously he would like to see him in his office and review and let me know if he would like tissue biopsy for this area, but he did state this was likely given that some lymphomas can change.   I discussed this with the patient and his wife as well letting them know that the appointment with the oncologist would be next week and that they would discuss all of the treatment options at that time of tissue biopsy first, port placement and if further chemotherapy as needed. I did discuss with them that the biopsy would be initial laparoscopic examination of the abdomen and an attempted laparoscopically biopsy the lymph node that appeared to be in the mesentery of the small bowel as well. I did discuss with them the injury to bowel or the blood supply to bowel potentially causing the need for resection of bowel and reconnecting of this area. I also discussed  with them the risk of bleeding, infection, leakage from bowel resection, damage to other structures including bowel and vessels, as well as potential for converting to an open procedure and anesthetic risk of heart attack, stroke, blood clots and death.  Patient and wife were given opportunity to ask questions and have them answered. At this time they would like to discuss with her new oncologist and determine if the biopsy is the option they would like to go with and will call back for an appointment and I will be in conversation with Dr. Rogue Bussing as well.

## 2016-06-19 NOTE — Transfer of Care (Signed)
Immediate Anesthesia Transfer of Care Note  Patient: Troy Reynolds  Procedure(s) Performed: Procedure(s): LAPAROSCOPIC REMOVAL OF MESENTERIC MASS (N/A) SMALL BOWEL RESECTION (N/A)  Patient Location: PACU  Anesthesia Type:General  Level of Consciousness: sedated  Airway & Oxygen Therapy: Patient Spontanous Breathing  Post-op Assessment: Report given to RN and Post -op Vital signs reviewed and stable  Post vital signs: Reviewed and stable  Last Vitals:  Vitals:   06/19/16 0802  BP: 136/87  Pulse: 77  Resp: 18  Temp: 36.8 C    Last Pain:  Vitals:   06/19/16 0802  TempSrc: Oral         Complications: No apparent anesthesia complications and persistent nausea and vomiting

## 2016-06-19 NOTE — Consult Note (Signed)
Consultation  Referring Provider:  Dr Azalee Course  Admit date7/27/17  Consult date        06/19/16 Reason for Consultation:     Possible IBD         HPI:   Troy Reynolds is a 65 y.o. 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 Had exploratory lap/lymph node excision/small bowel resection today for further evaluation of this. It was noted at that time that patient may have some inflammation of the colon, so GI was asked to see patient. 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 There was improvement shortly after so medication was stopped. Last seen in GI clinic 8/15 in follow up of lower abdominal pain/CT/EGD/colonoscopy: GERD was well controlled on PPi&H2RA. He had no further GI symptoms. Results from testing as follows:            Colonoscopy/egd 7/15: Colonoscopy revealed some hyperplastic polyps, diverticula, an unremarkable TI, as well as an otherwise unremarkable colon. Biopsies throughout the colon demonstrated some active, although microscopic)enteritis to the TI, otherwise there were no pathologic abnormalities to the rest of the colon biopsies. It was mentioned that the TI biopsy may be related to prep effect, as there was no mention of features of chronicity/architecture changes.  EGD revealed a tortuous lower esophagus, a widely patent Schatzki ring, medium sized hiatal hernia, gastritis, and a papule in the incisura. Duodenum was normal. Biopsies negative for H pylori, dysplasia, malignancy. There was also no Barretts esophagus. Ct abdomen pelvis done prior to procedures revealed some stable hepatic areas presumed to be cysts, kidney cysts, and a kidney stone. There was some stable lymph nodes,  presumed to be reactive but no inflammatory bowel changes. Small bowel was noted to be intact. Had SBFT following endoscopies that was noted to be normal. Patient reports today that his GERD is still controlled by qd Pantoprazole and Ranitidine. Describes hard stools/mild constipation. Has some midline abominal soreness around his incision. Denies melena/hematochezia/NVD, unplanned weight loss, further GI related complaints. Recent cbc/cmp/ldh unremarkable.     Past Medical History:  Diagnosis Date  . Cancer ([place])    lymphoma  . GERD (gastroesophageal reflux disease)   . Hypertension     Past Surgical History:  Procedure Laterality Date  . BOWEL RESECTION N/A 06/19/2016   Procedure: SMALL BOWEL RESECTION;  Surgeon: Hubbard Robinson, MD;  Location: [facility] ORS;  Service: General;  Laterality: N/A;  . CHOLECYSTECTOMY    . LAPAROSCOPIC REMOVAL OF MESENTERIC MASS N/A 06/19/2016   Procedure: LAPAROSCOPIC REMOVAL OF MESENTERIC MASS;  Surgeon: Hubbard Robinson, MD;  Location: [facility] ORS;  Service: General;  Laterality: N/A;  . LYMPH NODE DISSECTION    . PORT-A-CATH REMOVAL    . PORTACATH PLACEMENT      History reviewed. No pertinent family history. father had lymphoma  Social History  Substance Use Topics  . Smoking status: Never Smoker  . Smokeless tobacco: Never Used  . Alcohol use No    Prior to Admission medications   Medication Sig Start Date End Date Taking? Authorizing Provider  fluticasone (FLONASE) 50 MCG/ACT nasal spray Place 1 spray into both nostrils daily as needed for allergies or rhinitis.   Yes Historical Provider,  MD  guaifenesin (ROBITUSSIN) 100 MG/5ML syrup Take 200 mg by mouth 3 (three) times daily as needed for cough.   Yes Historical Provider, MD  loratadine (CLARITIN) 10 MG tablet Take 10 mg by mouth daily.   Yes Historical Provider, MD  losartan-hydrochlorothiazide (HYZAAR) 50-12.5 MG per tablet TAKE 1 TABLET DAILY. 11/20/14  Yes Historical Provider, MD   Multiple Vitamin (MULTIVITAMIN WITH MINERALS) TABS tablet Take 1 tablet by mouth daily.   Yes Historical Provider, MD  pantoprazole (PROTONIX) 40 MG tablet Take 40 mg by mouth daily.  02/26/15  Yes Historical Provider, MD  ranitidine (ZANTAC) 150 MG tablet Take 150 mg by mouth at bedtime as needed for heartburn.    Yes Historical Provider, MD  cetirizine (ZYRTEC) 10 MG tablet Take 1 tablet by mouth daily. 05/30/16   Historical Provider, MD    Current Facility-Administered Medications  Medication Dose Route Frequency Provider Last Rate Last Dose  . acetaminophen (TYLENOL) tablet 1,000 mg  1,000 mg Oral Q6H Hubbard Robinson, MD      . cyclobenzaprine (FLEXERIL) tablet 10 mg  10 mg Oral TID Hubbard Robinson, MD   10 mg at 06/19/16 1510  . dextrose 5 % in lactated ringers infusion   Intravenous Continuous Hubbard Robinson, MD 125 mL/hr at 06/19/16 1506 1,000 mL at 06/19/16 1506  . [START ON 06/20/2016] enoxaparin (LOVENOX) injection 40 mg  40 mg Subcutaneous Q24H Hubbard Robinson, MD      . fluticasone (FLONASE) 50 MCG/ACT nasal spray 1 spray  1 spray Each Nare Daily PRN Hubbard Robinson, MD      . hydrALAZINE (APRESOLINE) injection 10 mg  10 mg Intravenous Q2H PRN Hubbard Robinson, MD      . Derrill Memo ON 06/20/2016] losartan (COZAAR) tablet 50 mg  50 mg Oral Daily Hubbard Robinson, MD       And  . Derrill Memo ON 06/20/2016] hydrochlorothiazide (MICROZIDE) capsule 12.5 mg  12.5 mg Oral Daily Hubbard Robinson, MD      . ketorolac (TORADOL) 30 MG/ML injection 30 mg  30 mg Intravenous Q6H Hubbard Robinson, MD   30 mg at 06/19/16 1518  . loratadine (CLARITIN) tablet 10 mg  10 mg Oral Daily Hubbard Robinson, MD      . morphine 2 MG/ML injection 2 mg  2 mg Intravenous Q4H PRN Hubbard Robinson, MD   2 mg at 06/19/16 1504  . ondansetron (ZOFRAN-ODT) disintegrating tablet 4 mg  4 mg Oral Q6H PRN Hubbard Robinson, MD       Or  . ondansetron (ZOFRAN) injection 4 mg  4 mg Intravenous Q6H PRN  Hubbard Robinson, MD      . oxyCODONE (Oxy IR/ROXICODONE) immediate release tablet 5-10 mg  5-10 mg Oral Q3H PRN Hubbard Robinson, MD      . Derrill Memo ON 06/20/2016] pantoprazole (PROTONIX) EC tablet 40 mg  40 mg Oral Daily Hubbard Robinson, MD        Allergies as of 06/05/2016  . (No Known Allergies)     Review of Systems:    All systems reviewed and negative except where noted in HPI.      Physical Exam:  Vital signs in last 24 hours: Temp:  [97.1 F (36.2 C)-98.3 F (36.8 C)] 97.6 F (36.4 C) (07/27 1527) Pulse Rate:  [69-77] 74 (07/27 1527) Resp:  [11-19] 18 (07/27 1527) BP: (125-151)/(75-94) 151/90 (07/27 1527) SpO2:  [95 %-100 %] 97 % (07/27 1527)  Weight:  [95.3 kg (210 lb)] 95.3 kg (210 lb) (07/27 0802)   General:   Pleasant man in NAD Head:  Normocephalic and atraumatic. Eyes:   No icterus.   Conjunctiva pink. Ears:  Normal auditory acuity. Mouth: Mucosa pink moist, no lesions. Neck:  Supple; no masses felt Lungs:  Respirations even and unlabored. Lungs clear to auscultation bilaterally.   No wheezes, crackles, or rhonchi.  Heart:  S1S2, RRR, no MRG. No edema. Abdomen:  Midline incision with dressing intact, scant bloody drainage. Otherwise benign. Flat, soft, nondistended. Normal bowel sounds. No appreciable masses or hepatomegaly. No rebound signs or other peritoneal signs. Msk:  MAEW x4, No clubbing or cyanosis. Strength 5/5. Symmetrical without gross deformities. Neurologic:  Alert and  oriented x4;  Cranial nerves II-XII intact.  Skin:  Warm, dry, pink without significant lesions or rashes. Psych:  Alert and cooperative. Normal affect.  LAB RESULTS: No results for input(s): WBC, HGB, HCT, PLT in the last 72 hours. BMET No results for input(s): NA, K, CL, CO2, GLUCOSE, BUN, CREATININE, CALCIUM in the last 72 hours. LFT No results for input(s): PROT, ALBUMIN, AST, ALT, ALKPHOS, BILITOT, BILIDIR, IBILI in the last 72 hours. PT/INR No results for input(s):  LABPROT, INR in the last 72 hours.  STUDIES: No results found.     Impression / Plan:   1.Possible IBD- no definite crohns/UC on prior colonoscopies and has been off mesalamine/IBD agents for several years and done well. Would like to see him as outpatient in the next 3w or so to discuss further, and assess IBD panel at this time.  Thank you very much for this consult. These services were provided by Stephens November, NP-C, in collaboration with Lollie Sails, MD, with whom I have discussed this patient in full.   Stephens November, NP-C

## 2016-06-19 NOTE — Brief Op Note (Signed)
06/19/2016  12:38 PM  PATIENT:  Troy Reynolds  65 y.o. male  PRE-OPERATIVE DIAGNOSIS: Lymphadenpathy of jejunal mesentery, hx of Small B cell lymphoma of neck  POST-OPERATIVE DIAGNOSIS:  same  PROCEDURE:  Procedure(s): LAPAROSCOPIC REMOVAL OF MESENTERIC MASS (N/A) SMALL BOWEL RESECTION (N/A)  SURGEON:  Surgeon(s) and Role:    * Hubbard Robinson, MD - Primary    * Nestor Lewandowsky, MD - Assisting  PHYSICIAN ASSISTANT:   ASSISTANTS: Marta Lamas, MD   ANESTHESIA:   general  EBL:  Total I/O In: 1300 [I.V.:1300] Out: 80 [Blood:75]  BLOOD ADMINISTERED:none  DRAINS: none   LOCAL MEDICATIONS USED:  MARCAINE     SPECIMEN:  Source of Specimen:  Mesenteric lymph nodes x 2, small bowel resection; ileal tumor excison  DISPOSITION OF SPECIMEN:  PATHOLOGY  COUNTS:  YES  TOURNIQUET:  * No tourniquets in log *  DICTATION: .Dragon Dictation  PLAN OF CARE: Admit to inpatient   PATIENT DISPOSITION:  PACU - hemodynamically stable.   Delay start of Pharmacological VTE agent (>24hrs) due to surgical blood loss or risk of bleeding: not applicable

## 2016-06-19 NOTE — Anesthesia Preprocedure Evaluation (Addendum)
Anesthesia Evaluation  Patient identified by MRN, date of birth, ID band Patient awake    Reviewed: Allergy & Precautions, NPO status , Patient's Chart, lab work & pertinent test results, reviewed documented beta blocker date and time   Airway Mallampati: II  TM Distance: >3 FB     Dental  (+) Chipped   Pulmonary           Cardiovascular hypertension, + dysrhythmias      Neuro/Psych    GI/Hepatic GERD  ,  Endo/Other    Renal/GU      Musculoskeletal   Abdominal   Peds  Hematology   Anesthesia Other Findings Crohns. Old Rbbb. EKG OK No change.  Reproductive/Obstetrics                            Anesthesia Physical Anesthesia Plan  ASA: III  Anesthesia Plan: General   Post-op Pain Management:    Induction: Intravenous  Airway Management Planned: Oral ETT  Additional Equipment:   Intra-op Plan:   Post-operative Plan:   Informed Consent: I have reviewed the patients History and Physical, chart, labs and discussed the procedure including the risks, benefits and alternatives for the proposed anesthesia with the patient or authorized representative who has indicated his/her understanding and acceptance.     Plan Discussed with: CRNA  Anesthesia Plan Comments:         Anesthesia Quick Evaluation

## 2016-06-19 NOTE — Consult Note (Signed)
Please see full consult by Ms Jacqulyn Liner to follow.  Patient admitted with abnormal PET in setting of h/o b cell lymphoma, now status post ex-lap.   Finding of colon erythema noted on procedure.  Asked to see patient in regard to this.  Patient known to GI, with a history of indeterminate colitis, possible crohns disease.  Patient with improvement of sx after initial diagnosis, was on mesalamine for a short time, then taken off due to histologic improvement.  No FU since 06/2014.  Discussed with patient, will need fu in GI o/p clinic in 2-3 w post op for further evaluation.  Will obtain [facility] IBD panel.  Will sign off, reconsult if needed.

## 2016-06-20 LAB — BASIC METABOLIC PANEL
ANION GAP: 7 (ref 5–15)
BUN: 20 mg/dL (ref 6–20)
CHLORIDE: 104 mmol/L (ref 101–111)
CO2: 25 mmol/L (ref 22–32)
Calcium: 8 mg/dL — ABNORMAL LOW (ref 8.9–10.3)
Creatinine, Ser: 0.99 mg/dL (ref 0.61–1.24)
GFR calc non Af Amer: >60 mL/min (ref >60)
GLUCOSE: 127 mg/dL — AB (ref 65–99)
Potassium: 4 mmol/L (ref 3.5–5.1)
Sodium: 136 mmol/L (ref 135–145)

## 2016-06-20 LAB — CBC
HCT: 38.7 % — ABNORMAL LOW (ref 40.0–52.0)
Hemoglobin: 13.1 g/dL (ref 13.0–18.0)
MCH: 29.3 pg (ref 26.0–34.0)
MCHC: 33.8 g/dL (ref 32.0–36.0)
MCV: 86.7 fL (ref 80.0–100.0)
PLATELETS: 213 10*3/uL (ref 150–440)
RBC: 4.47 MIL/uL (ref 4.40–5.90)
RDW: 13.7 % (ref 11.5–14.5)
WBC: 14.5 10*3/uL — AB (ref 3.8–10.6)

## 2016-06-20 MED ORDER — MORPHINE SULFATE (PF) 2 MG/ML IV SOLN: 2.0000 mg | INTRAVENOUS | Status: DC | PRN

## 2016-06-20 NOTE — Progress Notes (Signed)
65 yr old male POD#1 from [place], small bowel resection x 2 and mesenteric biopsy x 2 for abnormal PET scan and hx of lymphoma.  Patient states doing well.  He is sore today but pain meds helping.  He is sitting up, taking in some clear liquids.    Vitals:   06/20/16 0025 06/20/16 0503  BP: 118/76 120/75  Pulse: 84 78  Resp: 20 20  Temp: 98.5 F (36.9 C) 98.4 F (36.9 C)   I/O last 3 completed shifts: In: 3090 [P.O.:360; I.V.:2730] Out: [id number] [Urine:1100; Blood:75] Total I/O In: 750 [P.O.:750] Out: -    PE:  Gen: NAD  Abd: soft, slightly distended, incision c/d/i without drainage or erythema, appropriately tender Ext: no edema  CBC Latest Ref Rng & Units 06/20/2016 05/28/2016 01/10/2016  WBC 3.8 - 10.6 K/uL 14.5(H) 7.1 5.5  Hemoglobin 13.0 - 18.0 g/dL 13.1 15.7 16.0  Hematocrit 40.0 - 52.0 % 38.7(L) 45.9 45.3  Platelets 150 - 440 K/uL 213 206 220   CMP Latest Ref Rng & Units 06/20/2016 05/28/2016 01/10/2016  Glucose 65 - 99 mg/dL 127(H) 94 126(H)  BUN 6 - 20 mg/dL 20 16 16   Creatinine 0.61 - 1.24 mg/dL 0.99 0.89 1.01  Sodium 135 - 145 mmol/L 136 138 135  Potassium 3.5 - 5.1 mmol/L 4.0 3.7 3.4(L)  Chloride 101 - 111 mmol/L 104 103 104  CO2 22 - 32 mmol/L 25 27 26   Calcium 8.9 - 10.3 mg/dL 8.0(L) 8.9 8.5(L)  Total Protein 6.5 - 8.1 g/dL - 7.1 7.2  Total Bilirubin 0.3 - 1.2 mg/dL - 0.4 0.6  Alkaline Phos 38 - 126 U/L - 72 78  AST 15 - 41 U/L - 22 22  ALT 17 - 63 U/L - 29 30   A/P:  65 yr old male POD#1 from [place], small bowel resection x 2 and mesenteric biopsy x 2 for abnormal PET scan and hx of lymphoma.   Pain: schedule tylenol, toradol and flexeril, with prn oxycodone and morphine GI: continue clear liquid diet until passing flatus, encourage ambulation Crohn's hx/IBD: appreciate GI consult, he will f/u as outpatient

## 2016-06-21 NOTE — Progress Notes (Signed)
65 yr old male POD#2 from [place], small bowel resection x 2 and mesenteric biopsy x 2 for abnormal PET scan and hx of lymphoma.  Patient states doing well.  He was up walking laps in hall yesterday and this AM.  He is passing a small amount of flatus but still feels bloated.  He has tolerated clear liquids well without any nausea.  He states pain has been well controlled.   Vitals:   06/21/16 0518 06/21/16 1003  BP: 109/71 114/71  Pulse: 73   Resp: 18   Temp: 98 F (36.7 C)    I/O last 3 completed shifts: In: 3816 [P.O.:1110; I.V.:2706] Out: 1500 [Urine:1500] No intake/output data recorded.   PE:  Gen: NAD  Abd: soft, slightly distended, incision c/d/i without drainage or erythema, appropriately tender Ext: no edema  CBC Latest Ref Rng & Units 06/20/2016 05/28/2016 01/10/2016  WBC 3.8 - 10.6 K/uL 14.5(H) 7.1 5.5  Hemoglobin 13.0 - 18.0 g/dL 13.1 15.7 16.0  Hematocrit 40.0 - 52.0 % 38.7(L) 45.9 45.3  Platelets 150 - 440 K/uL 213 206 220   CMP Latest Ref Rng & Units 06/20/2016 05/28/2016 01/10/2016  Glucose 65 - 99 mg/dL 127(H) 94 126(H)  BUN 6 - 20 mg/dL 20 16 16   Creatinine 0.61 - 1.24 mg/dL 0.99 0.89 1.01  Sodium 135 - 145 mmol/L 136 138 135  Potassium 3.5 - 5.1 mmol/L 4.0 3.7 3.4(L)  Chloride 101 - 111 mmol/L 104 103 104  CO2 22 - 32 mmol/L 25 27 26   Calcium 8.9 - 10.3 mg/dL 8.0(L) 8.9 8.5(L)  Total Protein 6.5 - 8.1 g/dL - 7.1 7.2  Total Bilirubin 0.3 - 1.2 mg/dL - 0.4 0.6  Alkaline Phos 38 - 126 U/L - 72 78  AST 15 - 41 U/L - 22 22  ALT 17 - 63 U/L - 29 30   A/P:  65 yr old male POD#2 from [place], small bowel resection x 2 and mesenteric biopsy x 2 for abnormal PET scan and hx of lymphoma.   Pain: schedule tylenol, toradol and flexeril, with prn oxycodone and morphine GI: continue clear liquid diet until passing flatus, may increase to full liquids this afternoon, encourage ambulation Crohn's hx/IBD: appreciate GI consult, he will f/u as outpatient

## 2016-06-22 NOTE — Progress Notes (Signed)
65 yr old male POD#3 from [place], small bowel resection x 2 and mesenteric biopsy x 2 for abnormal PET scan and hx of lymphoma.  Patient states doing well, resting comfortably.  He has been up walking laps.  He has been passing flatus and feeling less bloated.   Vitals:   06/22/16 1226 06/22/16 1227  BP: 132/87 130/83  Pulse: 72 70  Resp: 16   Temp: 97.7 F (36.5 C)    I/O last 3 completed shifts: In: 7224 [P.O.:2400; I.V.:4824] Out: 2550 [Urine:2550] Total I/O In: 2031 [P.O.:1000; I.V.:1031] Out: -    PE:  Gen: NAD  Abd: soft, slightly distended, incision c/d/i without drainage or erythema, appropriately tender Ext: no edema  CBC Latest Ref Rng & Units 06/20/2016 05/28/2016 01/10/2016  WBC 3.8 - 10.6 K/uL 14.5(H) 7.1 5.5  Hemoglobin 13.0 - 18.0 g/dL 13.1 15.7 16.0  Hematocrit 40.0 - 52.0 % 38.7(L) 45.9 45.3  Platelets 150 - 440 K/uL 213 206 220   CMP Latest Ref Rng & Units 06/20/2016 05/28/2016 01/10/2016  Glucose 65 - 99 mg/dL 127(H) 94 126(H)  BUN 6 - 20 mg/dL 20 16 16   Creatinine 0.61 - 1.24 mg/dL 0.99 0.89 1.01  Sodium 135 - 145 mmol/L 136 138 135  Potassium 3.5 - 5.1 mmol/L 4.0 3.7 3.4(L)  Chloride 101 - 111 mmol/L 104 103 104  CO2 22 - 32 mmol/L 25 27 26   Calcium 8.9 - 10.3 mg/dL 8.0(L) 8.9 8.5(L)  Total Protein 6.5 - 8.1 g/dL - 7.1 7.2  Total Bilirubin 0.3 - 1.2 mg/dL - 0.4 0.6  Alkaline Phos 38 - 126 U/L - 72 78  AST 15 - 41 U/L - 22 22  ALT 17 - 63 U/L - 29 30   A/P:  65 yr old male POD#3 from [place], small bowel resection x 2 and mesenteric biopsy x 2 for abnormal PET scan and hx of lymphoma.   Pain: schedule tylenol, toradol and flexeril, with prn oxycodone and morphine GI: advance to full liquids and then to regular this PM if does well,  encourage ambulation Crohn's hx/IBD: appreciate GI consult, he will f/u as outpatient

## 2016-06-23 LAB — CREATININE, SERUM
Creatinine, Ser: 0.77 mg/dL (ref 0.61–1.24)
GFR calc Af Amer: >60 mL/min (ref >60)
GFR calc non Af Amer: >60 mL/min (ref >60)

## 2016-06-23 MED ORDER — CYCLOBENZAPRINE HCL 10 MG PO TABS: 10.0000 mg | ORAL_TABLET | Freq: Three times a day (TID) | ORAL | 0 refills | Status: DC

## 2016-06-23 MED ORDER — ONDANSETRON 4 MG PO TBDP: 4.0000 mg | ORAL_TABLET | Freq: Four times a day (QID) | ORAL | 0 refills | Status: DC | PRN

## 2016-06-23 MED ORDER — OXYCODONE HCL 5 MG PO TABS: 5.0000 mg | ORAL_TABLET | ORAL | 0 refills | Status: DC | PRN

## 2016-06-23 NOTE — Discharge Instructions (Signed)
Exploratory Laparotomy, Adult, Care After °Refer to this sheet in the next few weeks. These instructions provide you with information about caring for yourself after your procedure. Your health care provider may also give you more specific instructions. Your treatment has been planned according to current medical practices, but problems sometimes occur. Call your health care provider if you have any problems or questions after your procedure. °WHAT TO EXPECT AFTER THE PROCEDURE °After your procedure, it is typical to have: °· Abdominal soreness. °· Fatigue. °· A sore throat from tubes in your throat. °· A lack of appetite. °HOME CARE INSTRUCTIONS °Medicines °· Take medicines only as directed by your health care provider. °· Do not drive or operate heavy machinery while taking pain medicine. °Incision Care °· There are many different ways to close and cover an incision, including stitches (sutures), skin glue, and adhesive strips. Follow your health care provider's instructions about: °¨ Incision care. °¨ Bandage (dressing) changes and removal. °¨ Incision closure removal. °· Do not take showers or baths until your health care provider says that you can. °· Check your incision area daily for signs of infection. Watch for: °¨ Redness. °¨ Tenderness. °¨ Swelling. °¨ Drainage. °Activities °· Do not lift anything that is heavier than 10 pounds (4.5 kg) until your health care provider says that it is safe. °· Try to walk a little bit each day if your health care provider says that it is okay. °· Ask your health care provider when you can start to do your usual activities again, such as driving, going back to work, and having sex. °Eating and Drinking °· You may eat what you usually eat. Include lots of whole grains, fruits, and vegetables in your diet. This will help to prevent constipation. °· Drink enough fluid to keep your urine clear or pale yellow. °General Instructions °· Keep all follow-up visits as directed by  your health care provider. This is important. °SEEK MEDICAL CARE IF:  °· You have a fever. °· You have chills. °· Your pain medicine is not helping. °· You have constipation or diarrhea. °· You have nausea or vomiting. °· You have drainage, redness, swelling, or pain at your incision site. °SEEK IMMEDIATE MEDICAL CARE IF: °· Your pain is getting worse. °· It has been more than 3 days since you been able to have a bowel movement. °· You have ongoing (persistent) vomiting. °· The edges of your incision open up. °· You have warmth, tenderness, and swelling in your calf. °· You have trouble breathing. °· You have chest pain. °  °This information is not intended to replace advice given to you by your health care provider. Make sure you discuss any questions you have with your health care provider. °  °Document Released: 06/24/2004 Document Revised: 12/01/2014 Document Reviewed: 06/28/2014 °Elsevier Interactive Patient Education ©2016 [employer] ° °

## 2016-06-23 NOTE — Progress Notes (Signed)
Alert and oriented. Vital signs stable . No signs of acute distress. Discharge instructions given. Patient verbalizes understanding. No other issues noted at this time.   

## 2016-06-23 NOTE — Care Management Important Message (Signed)
Important Message  Patient Details  Name: Troy Reynolds MRN: [id number] Date of Birth: [birth year]   Medicare Important Message Given:  Yes    Carles Collet, RN 06/23/2016, 8:18 AM

## 2016-06-23 NOTE — Discharge Summary (Signed)
Patient ID: KIM BOLON MRN: [id number] DOB/AGE: Sep 17, 1951 65 y.o.  Admit date: 06/19/2016 Discharge date: 06/23/2016  Discharge Diagnoses:  History of lymphoma  Procedures Performed: Exploratory laparotomy with abdominal biopsies for possible recurrent lymphoma  Discharged Condition: good  Hospital Course: Patient brought to the operating room for a scheduled export her laparotomy to evaluate for possible recurrent lymphoma. Patient tolerated the surgery well. Had the anticipated postoperative course without any untoward events. At the time of discharge his abdomen was soft and benign. He was tolerating regular diet and having bowel function.  Discharge Orders:  discharge home  Disposition: 01-Home or Self Care  Discharge Medications:    Medication List    TAKE these medications   cetirizine 10 MG tablet Commonly known as:  ZYRTEC Take 1 tablet by mouth daily.   cyclobenzaprine 10 MG tablet Commonly known as:  FLEXERIL Take 1 tablet (10 mg total) by mouth 3 (three) times daily.   fluticasone 50 MCG/ACT nasal spray Commonly known as:  FLONASE Place 1 spray into both nostrils daily as needed for allergies or rhinitis.   guaifenesin 100 MG/5ML syrup Commonly known as:  ROBITUSSIN Take 200 mg by mouth 3 (three) times daily as needed for cough.   loratadine 10 MG tablet Commonly known as:  CLARITIN Take 10 mg by mouth daily.   losartan-hydrochlorothiazide 50-12.5 MG tablet Commonly known as:  HYZAAR TAKE 1 TABLET DAILY.   multivitamin with minerals Tabs tablet Take 1 tablet by mouth daily.   ondansetron 4 MG disintegrating tablet Commonly known as:  ZOFRAN-ODT Take 1 tablet (4 mg total) by mouth every 6 (six) hours as needed for nausea.   oxyCODONE 5 MG immediate release tablet Commonly known as:  Oxy IR/ROXICODONE Take 1-2 tablets (5-10 mg total) by mouth every 3 (three) hours as needed for severe pain or breakthrough pain.   pantoprazole 40 MG  tablet Commonly known as:  PROTONIX Take 40 mg by mouth daily.   ranitidine 150 MG tablet Commonly known as:  ZANTAC Take 150 mg by mouth at bedtime as needed for heartburn.        Follwup: Follow-up Information    Hubbard Robinson, MD. Go on 06/26/2016.   Specialty:  Surgery Why:  at 1:30 PM Contact information: [address] [place] [address] [phone]           Signed: Clayburn Pert 06/23/2016, 2:44 PM

## 2016-06-23 NOTE — Care Management Note (Signed)
Case Management Note  Patient Details  Name: Troy Reynolds MRN: [id number] Date of Birth: 03/06/1951  Subjective/Objective:                 Patient admitted with exploratory lap, bowel resection. Spoke with patient and wife in room.  Will DC today to home with wife, no CM needs identified.   Action/Plan:  DC to home today with wife, no CM needs identified.  Expected Discharge Date:                  Expected Discharge Plan:  Home/Self Care  In-House Referral:  NA  Discharge planning Services  CM Consult  Post Acute Care Choice:  NA Choice offered to:  NA  DME Arranged:  N/A DME Agency:  NA  HH Arranged:  NA HH Agency:  NA  Status of Service:  Completed, signed off  If discussed at [place] of Stay Meetings, dates discussed:    Additional Comments:  Carles Collet, RN 06/23/2016, 10:51 AM

## 2016-06-24 LAB — INFLAMMATORY BOWEL DISEASE-IBD
Atypical P-ANCA titer: <1:20 {titer}
Saccharomyces cerevisiae, IgA: <20 Units (ref 0.0–24.9)
Saccharomyces cerevisiae, IgG: <20 Units (ref 0.0–24.9)

## 2016-06-25 ENCOUNTER — Telehealth: Payer: Self-pay | Admitting: Surgery

## 2016-06-25 NOTE — Telephone Encounter (Signed)
Spoke with patient at this time and he was instructed to increase the fiber and to drink plenty of water daily and to continue with the Miralax. He is on the schedule to see Dr.Loflin tomorrow-06/26/16. Patient verbalized understanding.

## 2016-06-25 NOTE — Telephone Encounter (Signed)
Patient had laparoscopic removal of mesenteric mass and small bowel resection on 7/27 with Dr Azalee Course. He was discharged on 7/31. Patient has not had bowel movement or passed gas since he left the hospital on Monday 7/31. He is having abdominal pain and would like to know if it is okay to use Miralax.

## 2016-06-26 ENCOUNTER — Encounter: Payer: Self-pay | Admitting: Surgery

## 2016-06-26 ENCOUNTER — Ambulatory Visit (INDEPENDENT_AMBULATORY_CARE_PROVIDER_SITE_OTHER): Payer: 59 | Admitting: Surgery

## 2016-06-26 ENCOUNTER — Telehealth: Payer: Self-pay

## 2016-06-26 VITALS — BP 127/85 | HR 105 | Temp 98.7°F | Wt 205.0 lb

## 2016-06-26 DIAGNOSIS — Z9049 Acquired absence of other specified parts of digestive tract: Secondary | ICD-10-CM

## 2016-06-26 DIAGNOSIS — C8330 Diffuse large B-cell lymphoma, unspecified site: Secondary | ICD-10-CM

## 2016-06-26 NOTE — Progress Notes (Signed)
65 yr old male who is status post a exploratory laparoscopy with small bowel resection and mesenteric biopsies 2. The patient is been doing fairly well he has been taking oxycodone every 8 hours. Patient has not been taking any Tylenol or ibuprofen. The patient has been up and moving around. Patient states his appetite has been good. Patient states that he's having some bowel movements but they are hard. Is taking some MiraLAX to help with this.  Vitals:   06/26/16 1347  BP: 127/85  Pulse: (!) 105  Temp: 98.7 F (37.1 C)   PE:  Gen: NAD Abd: soft, incisions c/d/i, no erythema or drainage Ext: 2+ pulses, no edema  A/P:  I discussed with the patient increasing his water intake as well as using the MiraLAX to keep nice soft bowel movements. I also discussed with the patient to start taking Tylenol on a regular basis and use ibuprofen for pain in between and to use the oxycodone sparingly.  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 I also spoke with Dr. Rogue Bussing  about further treatment and potential need for port placement,  he stated that he would need to see the final pathology and other stains to determine what type of treatment would be best.  I discussed the pathology preliminary results with the patient and the family and explained the above. The patient does have a follow-up appointment Dr. Yevette Edwards on the 14th.  I'll have him follow-up with me on the 14th as well for a wound check and to remove his staples and to ensure that his bowel movements are improving.

## 2016-06-26 NOTE — Patient Instructions (Addendum)
Can you please start taking Ibuprofen 800 MG every 8 hours for your pain. You are also able to take Tylenol 1000 MG in between to help you with the pain.  Please give [place] a call if you have any questions or concerns.  We will see you next week to remove your staples and to go over your bowel movements. Remember that you are able to take Miralax and Stool softeners to help with your constipation.

## 2016-06-26 NOTE — Telephone Encounter (Signed)
FMLA Troy Reynolds was filled out and faxed to [employer] and his employer.

## 2016-06-27 ENCOUNTER — Telehealth: Payer: Self-pay | Admitting: Surgery

## 2016-06-27 NOTE — Telephone Encounter (Signed)
I spoke with patient and confirmed the changes in his upcoming appointments. He will see Dr Rogue Bussing on Monday August 7th at 9:15am at [facility]. He will see Dr Azalee Course Monday August 14th at 11:45am in the [facility] office. Patient verbalized understanding. I also obtained another contact number for him as we have been unable to reach him on his home phone. His cell number [phone] has been added to his chart.

## 2016-06-30 ENCOUNTER — Inpatient Hospital Stay: Payer: 59 | Attending: Internal Medicine | Admitting: Internal Medicine

## 2016-06-30 VITALS — BP 123/79 | HR 97 | Temp 97.5°F | Resp 18 | Wt 200.2 lb

## 2016-06-30 DIAGNOSIS — Z9221 Personal history of antineoplastic chemotherapy: Secondary | ICD-10-CM

## 2016-06-30 DIAGNOSIS — Z79899 Other long term (current) drug therapy: Secondary | ICD-10-CM | POA: Insufficient documentation

## 2016-06-30 DIAGNOSIS — C8293 Follicular lymphoma, unspecified, intra-abdominal lymph nodes: Secondary | ICD-10-CM

## 2016-06-30 DIAGNOSIS — Z923 Personal history of irradiation: Secondary | ICD-10-CM | POA: Diagnosis not present

## 2016-06-30 DIAGNOSIS — C8330 Diffuse large B-cell lymphoma, unspecified site: Secondary | ICD-10-CM

## 2016-06-30 DIAGNOSIS — K219 Gastro-esophageal reflux disease without esophagitis: Secondary | ICD-10-CM | POA: Insufficient documentation

## 2016-06-30 DIAGNOSIS — I10 Essential (primary) hypertension: Secondary | ICD-10-CM | POA: Insufficient documentation

## 2016-06-30 DIAGNOSIS — C8203 Follicular lymphoma grade I, intra-abdominal lymph nodes: Secondary | ICD-10-CM | POA: Diagnosis not present

## 2016-06-30 DIAGNOSIS — G4700 Insomnia, unspecified: Secondary | ICD-10-CM

## 2016-06-30 DIAGNOSIS — Z8572 Personal history of non-Hodgkin lymphomas: Secondary | ICD-10-CM | POA: Insufficient documentation

## 2016-06-30 NOTE — Assessment & Plan Note (Addendum)
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 Surveillance PET scan shows- jejunal PET avid progressive.   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 spoke to Dr.Baker. Also discussed with Dr.Loflin.  Hold off On the port placement at this time. Discussed that the treatment options would depend upon stage of the disease.   # check BMBx/labs at next visit. Patient follow-up with me tentatively in 2 weeks.  # Insomnia- recommend Tylenol PM.

## 2016-06-30 NOTE — Progress Notes (Signed)
[facility] OFFICE PROGRESS NOTE  Patient Care Team: Rusty Aus, MD as PCP - General (Internal Medicine) Hubbard Robinson, MD as Consulting Physician (Surgery)  No matching staging information was found for the patient.   Oncology History   OCT 2014- RIGHT NECK NODE [Dr.McQueen]:STAGE IA [BMBx-NEG]  MALIGANT LYMPHOMA [-DIFFUSE LARGE B CELL LYMPHOMA (123456); -FOLLICULAR LYMPHOMA, GRADES 3A AND 3B (40%)] - RCHOP  x4 + IFRT  # June 2017- PET- Isolated Jejunal LN uptake- [Dr.Loflin]- "follicular lymphoma"     B-cell lymphoma ([place])   08/25/2014 Initial Diagnosis    B-cell lymphoma ([facility])      DLBCL (diffuse large B cell lymphoma) ([place])   05/27/2016 Initial Diagnosis    DLBCL (diffuse large B cell lymphoma) ([place])       INTERVAL HISTORY:  Troy Reynolds 65 y.o.  male pleasant patient above history of Diffuse large B-cell lymphoma; noted to have a lymph node in the abdomen/jejunum PET avid over the last few months- He finally underwent laparoscopic excisional biopsy of the regional lymph node.   He denies any unusual abdominal pain nausea vomiting. Denies any constipation or diarrhea.No fevers or chills. No weight loss or night sweats.  Complains of difficulty sleeping at night.  REVIEW OF SYSTEMS:  A complete 10 point review of system is done which is negative except mentioned above/history of present illness.   PAST MEDICAL HISTORY :  Past Medical History:  Diagnosis Date  . Cancer ([place])    lymphoma  . GERD (gastroesophageal reflux disease)   . Hypertension     PAST SURGICAL HISTORY :   Past Surgical History:  Procedure Laterality Date  . BOWEL RESECTION N/A 06/19/2016   Procedure: SMALL BOWEL RESECTION;  Surgeon: Hubbard Robinson, MD;  Location: [facility] ORS;  Service: General;  Laterality: N/A;  . CHOLECYSTECTOMY    . LAPAROSCOPIC REMOVAL OF MESENTERIC MASS N/A 06/19/2016   Procedure: LAPAROSCOPIC REMOVAL OF MESENTERIC MASS;  Surgeon: Hubbard Robinson, MD;   Location: [facility] ORS;  Service: General;  Laterality: N/A;  . LYMPH NODE DISSECTION    . PORT-A-CATH REMOVAL    . PORTACATH PLACEMENT      FAMILY HISTORY :   Family History  Problem Relation Age of Onset  . Clotting disorder Mother   . Lymphoma Father   . Hiatal hernia Father   . Heart disease Father     stent placed  . Cancer Paternal Uncle     esophagus  . Brain cancer Maternal Aunt     SOCIAL HISTORY:   Social History  Substance Use Topics  . Smoking status: Never Smoker  . Smokeless tobacco: Never Used  . Alcohol use No    ALLERGIES:  has No Known Allergies.  MEDICATIONS:  Current Outpatient Prescriptions  Medication Sig Dispense Refill  . cyclobenzaprine (FLEXERIL) 10 MG tablet Take 1 tablet (10 mg total) by mouth 3 (three) times daily. 30 tablet 0  . fluticasone (FLONASE) 50 MCG/ACT nasal spray Place 1 spray into both nostrils daily as needed for allergies or rhinitis.    Marland Kitchen guaifenesin (ROBITUSSIN) 100 MG/5ML syrup Take 200 mg by mouth 3 (three) times daily as needed for cough.    Marland Kitchen ibuprofen (ADVIL,MOTRIN) 800 MG tablet Take 800 mg by mouth every 8 (eight) hours as needed.    . loratadine (CLARITIN) 10 MG tablet Take 10 mg by mouth daily.    Marland Kitchen losartan-hydrochlorothiazide (HYZAAR) 50-12.5 MG per tablet TAKE 1 TABLET DAILY.    Marland Kitchen  Multiple Vitamin (MULTIVITAMIN WITH MINERALS) TABS tablet Take 1 tablet by mouth daily.    . pantoprazole (PROTONIX) 40 MG tablet Take 40 mg by mouth daily.     . ranitidine (ZANTAC) 150 MG tablet Take 150 mg by mouth at bedtime as needed for heartburn.     Marland Kitchen oxyCODONE (OXY IR/ROXICODONE) 5 MG immediate release tablet Take 1-2 tablets (5-10 mg total) by mouth every 3 (three) hours as needed for severe pain or breakthrough pain. (Patient not taking: Reported on 06/30/2016) 30 tablet 0   No current facility-administered medications for this visit.     PHYSICAL EXAMINATION: ECOG PERFORMANCE STATUS: 0 - Asymptomatic  BP 123/79 (BP Location: Left  Arm, Patient Position: Sitting)   Pulse 97   Temp 97.5 F (36.4 C) (Tympanic)   Resp 18   Wt 200 lb 4 oz (90.8 kg)   BMI 26.42 kg/m   Filed Weights   06/30/16 0943  Weight: 200 lb 4 oz (90.8 kg)    GENERAL: Well-nourished well-developed; Alert, no distress and comfortable.  With his wife.  EYES: no pallor or icterus OROPHARYNX: no thrush or ulceration; good dentition  NECK: supple, no masses felt LYMPH:  no palpable lymphadenopathy in the cervical, axillary or inguinal regions LUNGS: clear to auscultation and  No wheeze or crackles HEART/CVS: regular rate & rhythm and no murmurs; No lower extremity edema ABDOMEN:abdomen soft, non-tender and normal bowel sounds Musculoskeletal:no cyanosis of digits and no clubbing  PSYCH: alert & oriented x 3 with fluent speech NEURO: no focal motor/sensory deficits SKIN:  no rashes or significant lesions  LABORATORY DATA:  I have reviewed the data as listed    Component Value Date/Time   NA 136 06/20/2016 0403   NA 140 03/12/2015 1007   K 4.0 06/20/2016 0403   K 3.7 03/12/2015 1007   CL 104 06/20/2016 0403   CL 105 03/12/2015 1007   CO2 25 06/20/2016 0403   CO2 29 03/12/2015 1007   GLUCOSE 127 (H) 06/20/2016 0403   GLUCOSE 143 (H) 03/12/2015 1007   BUN 20 06/20/2016 0403   BUN 15 03/12/2015 1007   CREATININE 0.77 06/23/2016 0526   CREATININE 0.92 03/12/2015 1007   CALCIUM 8.0 (L) 06/20/2016 0403   CALCIUM 8.6 (L) 03/12/2015 1007   PROT 7.1 05/28/2016 1654   PROT 6.6 03/12/2015 1007   ALBUMIN 4.3 05/28/2016 1654   ALBUMIN 4.0 03/12/2015 1007   AST 22 05/28/2016 1654   AST 26 03/12/2015 1007   ALT 29 05/28/2016 1654   ALT 37 03/12/2015 1007   ALKPHOS 72 05/28/2016 1654   ALKPHOS 79 03/12/2015 1007   BILITOT 0.4 05/28/2016 1654   BILITOT 0.8 03/12/2015 1007   GFRNONAA >60 06/23/2016 0526   GFRNONAA >60 03/12/2015 1007   GFRAA >60 06/23/2016 0526   GFRAA >60 03/12/2015 1007    No results found for: SPEP, UPEP  Lab  Results  Component Value Date   WBC 14.5 (H) 06/20/2016   NEUTROABS 4.9 05/28/2016   HGB 13.1 06/20/2016   HCT 38.7 (L) 06/20/2016   MCV 86.7 06/20/2016   PLT 213 06/20/2016      Chemistry      Component Value Date/Time   NA 136 06/20/2016 0403   NA 140 03/12/2015 1007   K 4.0 06/20/2016 0403   K 3.7 03/12/2015 1007   CL 104 06/20/2016 0403   CL 105 03/12/2015 1007   CO2 25 06/20/2016 0403   CO2 29 03/12/2015 1007   BUN  20 06/20/2016 0403   BUN 15 03/12/2015 1007   CREATININE 0.77 06/23/2016 0526   CREATININE 0.92 03/12/2015 1007      Component Value Date/Time   CALCIUM 8.0 (L) 06/20/2016 0403   CALCIUM 8.6 (L) 03/12/2015 1007   ALKPHOS 72 05/28/2016 1654   ALKPHOS 79 03/12/2015 1007   AST 22 05/28/2016 1654   AST 26 03/12/2015 1007   ALT 29 05/28/2016 1654   ALT 37 03/12/2015 1007   BILITOT 0.4 05/28/2016 1654   BILITOT 0.8 03/12/2015 1007       RADIOGRAPHIC STUDIES: I have personally reviewed the radiological images as listed and agreed with the findings in the report. No results found.   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 Surveillance PET scan shows- jejunal PET avid progressive.   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 spoke to Dr.Baker. Also discussed with Dr.Loflin.  Hold off On the port placement at this time. Discussed that the treatment options would depend upon stage of the disease.   # check BMBx/labs at next visit. Patient follow-up with me tentatively in 2 weeks.  # Insomnia- recommend Tylenol PM.   Orders Placed This Encounter  Procedures  . CT BIOPSY    Standing Status:   Future    Standing Expiration Date:   06/30/2017    Order Specific Question:   Lab orders requested (DO NOT place separate lab orders, these will be automatically ordered during procedure specimen collection):    Answer:    Other    Comments:   hem-path    Order Specific Question:   Reason for Exam (SYMPTOM  OR DIAGNOSIS REQUIRED)    Answer:   follicular lymphoma    Order Specific Question:   Preferred imaging location?    Answer:   [facility]   All questions were answered. The patient knows to call the clinic with any problems, questions or concerns.      Cammie Sickle, MD 06/30/2016 1:33 PM

## 2016-07-02 ENCOUNTER — Encounter: Payer: Self-pay | Admitting: General Surgery

## 2016-07-03 ENCOUNTER — Telehealth: Payer: Self-pay

## 2016-07-03 NOTE — Telephone Encounter (Signed)
Disability Form was filled out and faxed to [employer].

## 2016-07-04 ENCOUNTER — Ambulatory Visit: Payer: 59 | Admitting: Internal Medicine

## 2016-07-07 ENCOUNTER — Other Ambulatory Visit: Payer: Self-pay | Admitting: Radiology

## 2016-07-07 ENCOUNTER — Encounter: Payer: Self-pay | Admitting: Surgery

## 2016-07-07 ENCOUNTER — Ambulatory Visit (INDEPENDENT_AMBULATORY_CARE_PROVIDER_SITE_OTHER): Payer: 59 | Admitting: Surgery

## 2016-07-07 ENCOUNTER — Ambulatory Visit: Payer: 59 | Admitting: Internal Medicine

## 2016-07-07 VITALS — BP 165/96 | HR 88 | Temp 99.0°F | Wt 197.8 lb

## 2016-07-07 DIAGNOSIS — C8330 Diffuse large B-cell lymphoma, unspecified site: Secondary | ICD-10-CM

## 2016-07-07 NOTE — Patient Instructions (Signed)
Your steri-strips will fall off on there own in 7-10 days. You may trim them if they begin to come up off of the skin.  Please follow-up with Dr. Rogue Bussing as scheduled on 07/16/16.  Please call our office with any questions or concerns.  Please do not submerge in a tub, hot tub, or pool until incisions are completely sealed.  Use sun block to incision area over the next year if this area will be exposed to sun. This helps decrease scarring.  You may now resume your normal activities at HOME up to 20 lbs. Listen to your body when lifting, if you have pain when lifting, stop and then try again in a few days.  If you develop redness, drainage, or pain at incision sites- call our office immediately and speak with a nurse.

## 2016-07-08 ENCOUNTER — Ambulatory Visit (HOSPITAL_COMMUNITY)
Admission: RE | Admit: 2016-07-08 | Discharge: 2016-07-08 | Disposition: A | Discharge status: Home / Self Care | Payer: 59 | Source: Ambulatory Visit | Attending: Internal Medicine | Admitting: Internal Medicine

## 2016-07-08 ENCOUNTER — Encounter (HOSPITAL_COMMUNITY): Payer: Self-pay

## 2016-07-08 ENCOUNTER — Telehealth: Payer: Self-pay | Admitting: Surgery

## 2016-07-08 ENCOUNTER — Other Ambulatory Visit: Payer: Self-pay | Admitting: General Surgery

## 2016-07-08 DIAGNOSIS — Z807 Family history of other malignant neoplasms of lymphoid, hematopoietic and related tissues: Secondary | ICD-10-CM | POA: Insufficient documentation

## 2016-07-08 DIAGNOSIS — K219 Gastro-esophageal reflux disease without esophagitis: Secondary | ICD-10-CM | POA: Diagnosis not present

## 2016-07-08 DIAGNOSIS — Z8249 Family history of ischemic heart disease and other diseases of the circulatory system: Secondary | ICD-10-CM | POA: Insufficient documentation

## 2016-07-08 DIAGNOSIS — Z9049 Acquired absence of other specified parts of digestive tract: Secondary | ICD-10-CM | POA: Diagnosis not present

## 2016-07-08 DIAGNOSIS — C8330 Diffuse large B-cell lymphoma, unspecified site: Secondary | ICD-10-CM | POA: Diagnosis not present

## 2016-07-08 DIAGNOSIS — Z808 Family history of malignant neoplasm of other organs or systems: Secondary | ICD-10-CM | POA: Diagnosis not present

## 2016-07-08 DIAGNOSIS — I10 Essential (primary) hypertension: Secondary | ICD-10-CM | POA: Insufficient documentation

## 2016-07-08 LAB — BASIC METABOLIC PANEL
Anion gap: 8 (ref 5–15)
BUN: 15 mg/dL (ref 6–20)
CHLORIDE: 105 mmol/L (ref 101–111)
CO2: 23 mmol/L (ref 22–32)
Calcium: 8.8 mg/dL — ABNORMAL LOW (ref 8.9–10.3)
Creatinine, Ser: 0.99 mg/dL (ref 0.61–1.24)
Glucose, Bld: 108 mg/dL — ABNORMAL HIGH (ref 65–99)
POTASSIUM: 5.6 mmol/L — AB (ref 3.5–5.1)
SODIUM: 136 mmol/L (ref 135–145)

## 2016-07-08 LAB — CBC WITH DIFFERENTIAL/PLATELET
BASOS ABS: 0.1 10*3/uL (ref 0.0–0.1)
Basophils Relative: 1 %
EOS ABS: 0.1 10*3/uL (ref 0.0–0.7)
Eosinophils Relative: 2 %
HCT: 44.2 % (ref 39.0–52.0)
HEMOGLOBIN: 14.7 g/dL (ref 13.0–17.0)
LYMPHS ABS: 1 10*3/uL (ref 0.7–4.0)
LYMPHS PCT: 14 %
MCH: 28.7 pg (ref 26.0–34.0)
MCHC: 33.3 g/dL (ref 30.0–36.0)
MCV: 86.3 fL (ref 78.0–100.0)
Monocytes Absolute: 0.7 10*3/uL (ref 0.1–1.0)
Monocytes Relative: 9 %
NEUTROS PCT: 74 %
Neutro Abs: 5.1 10*3/uL (ref 1.7–7.7)
Platelets: 422 10*3/uL — ABNORMAL HIGH (ref 150–400)
RBC: 5.12 MIL/uL (ref 4.22–5.81)
RDW: 13.7 % (ref 11.5–15.5)
WBC: 6.9 10*3/uL (ref 4.0–10.5)

## 2016-07-08 LAB — BONE MARROW EXAM

## 2016-07-08 LAB — PROTIME-INR
INR: 0.96
Prothrombin Time: 12.8 seconds (ref 11.4–15.2)

## 2016-07-08 MED ORDER — SODIUM CHLORIDE 0.9 % IV SOLN
INTRAVENOUS | Status: DC
  Administered 2016-07-08: 09:00:00 via INTRAVENOUS

## 2016-07-08 MED ORDER — MIDAZOLAM HCL 2 MG/2ML IJ SOLN
INTRAMUSCULAR | Status: AC | PRN
  Administered 2016-07-08 (×2): 1 mg via INTRAVENOUS

## 2016-07-08 MED ORDER — MIDAZOLAM HCL 2 MG/2ML IJ SOLN
INTRAMUSCULAR | Status: AC
  Filled 2016-07-08: qty 6

## 2016-07-08 MED ORDER — FENTANYL CITRATE (PF) 100 MCG/2ML IJ SOLN
INTRAMUSCULAR | Status: AC | PRN
  Administered 2016-07-08: 25 ug via INTRAVENOUS
  Administered 2016-07-08: 50 ug via INTRAVENOUS

## 2016-07-08 MED ORDER — FENTANYL CITRATE (PF) 100 MCG/2ML IJ SOLN
INTRAMUSCULAR | Status: AC
  Filled 2016-07-08: qty 4

## 2016-07-08 NOTE — Procedures (Signed)
Interventional Radiology Procedure Note  Procedure: CT guided aspirate and core biopsy of right posterior iliac bone Complications: None Recommendations: - Bedrest supine x 1 hrs - OTC's PRN  Pain - Follow biopsy results  Signed,  Adeyemi Hamad S. Syed Zukas, DO    

## 2016-07-08 NOTE — Discharge Instructions (Signed)
Bone Marrow Aspiration and Bone Marrow Biopsy, Care After Refer to this sheet in the next few weeks. These instructions provide you with information about caring for yourself after your procedure. Your health care provider may also give you more specific instructions. Your treatment has been planned according to current medical practices, but problems sometimes occur. Call your health care provider if you have any problems or questions after your procedure. WHAT TO EXPECT AFTER THE PROCEDURE After your procedure, it is common to have:  Soreness or tenderness around the puncture site.  Bruising. HOME CARE INSTRUCTIONS  Take medicines only as directed by your health care provider.  Follow your health care provider's instructions about:  Puncture site care.  Bandage (dressing) changes and removal.  Bathe and shower as directed by your health care provider.  Check your puncture site every day for signs of infection. Watch for:  Redness, swelling, or pain.  Fluid, blood, or pus.  Return to your normal activities as directed by your health care provider.  Keep all follow-up visits as directed by your health care provider. This is important. SEEK MEDICAL CARE IF:  You have a fever.  You have uncontrollable bleeding.  You have redness, swelling, or pain at the site of your puncture.  You have fluid, blood, or pus coming from your puncture site.   This information is not intended to replace advice given to you by your health care provider. Make sure you discuss any questions you have with your health care provider.   Document Released: 05/30/2005 Document Revised: 03/27/2015 Document Reviewed: 11/01/2014 Elsevier Interactive Patient Education 2016 Elsevier [employer]. Moderate Conscious Sedation, Adult, Care After Refer to this sheet in the next few weeks. These instructions provide you with information on caring for yourself after your procedure. Your health care provider may also give  you more specific instructions. Your treatment has been planned according to current medical practices, but problems sometimes occur. Call your health care provider if you have any problems or questions after your procedure. WHAT TO EXPECT AFTER THE PROCEDURE  After your procedure:  You may feel sleepy, clumsy, and have poor balance for several hours.  Vomiting may occur if you eat too soon after the procedure. HOME CARE INSTRUCTIONS  Do not participate in any activities where you could become injured for at least 24 hours. Do not:  Drive.  Swim.  Ride a bicycle.  Operate heavy machinery.  Cook.  Use power tools.  Climb ladders.  Work from a high place.  Do not make important decisions or sign legal documents until you are improved.  If you vomit, drink water, juice, or soup when you can drink without vomiting. Make sure you have little or no nausea before eating solid foods.  Only take over-the-counter or prescription medicines for pain, discomfort, or fever as directed by your health care provider.  Make sure you and your family fully understand everything about the medicines given to you, including what side effects may occur.  You should not drink alcohol, take sleeping pills, or take medicines that cause drowsiness for at least 24 hours.  If you smoke, do not smoke without supervision.  If you are feeling better, you may resume normal activities 24 hours after you were sedated.  Keep all appointments with your health care provider. SEEK MEDICAL CARE IF:  Your skin is pale or bluish in color.  You continue to feel nauseous or vomit.  Your pain is getting worse and is not helped by medicine.  You have bleeding or swelling.  You are still sleepy or feeling clumsy after 24 hours. SEEK IMMEDIATE MEDICAL CARE IF:  You develop a rash.  You have difficulty breathing.  You develop any type of allergic problem.  You have a fever. MAKE SURE YOU:  Understand  these instructions.  Will watch your condition.  Will get help right away if you are not doing well or get worse.   This information is not intended to replace advice given to you by your health care provider. Make sure you discuss any questions you have with your health care provider.   Document Released: 08/31/2013 Document Revised: 12/01/2014 Document Reviewed: 08/31/2013 Elsevier Interactive Patient Education [employer].

## 2016-07-08 NOTE — Telephone Encounter (Signed)
Patient of Dr Azalee Course with hx of B cell lymphoma, now with jejunal B cell follicular lymphoma. Patient called to advise he would like to wait till Monday August 21st to return to work. He is having a bone marrow biopsy today and is still feeling fatigued. Per Dr Geoffry Paradise notes from office visit with patient yesterday (07/07/16) She states, "He can return to work on Monday with restrictions for a couple more weeks.  He may return sooner if he wishes but with the fatigue and upcoming biopsy will leave that to how the patient is feeling." Patient calling to say he would prefer to wait until the 21st to return.

## 2016-07-08 NOTE — Progress Notes (Signed)
65 yr old male with hx of B cell lymphoma, now with jejunal B cell follicular lymphoma, discussed with pathology today is Grade I. The patient is doing well.  He states that his appetite is good.  He is having good BMs now with the Miralax and Colace for help. He has some trouble sleeping but is using tylenol pm for help.    Vitals:   07/07/16 1205  BP: (!) 165/96  Pulse: 88  Temp: 99 F (37.2 C)   PE: Gen: NAD Abd: staples removed, incision c/d/i  A/P:  Discussed the pathology with him the final should be out in the next couple days but will be Grade I.  We will hold off on a port placement until decided by Dr. Rogue Bussing if this will be needed.  Patient getting bone marrow biopsy tomorrow as well.  He can return to work on Monday with restrictions for a couple more weeks.  He may return sooner if he wishes but with the fatigue and upcoming biopsy will leave that to how the patient is feeling.  He can rtc with any questions or concerns or if further procedures are needed.

## 2016-07-08 NOTE — H&P (Signed)
Chief Complaint Patient was seen in consultation today for bone marrow biopsy at the request of Brahmanday,Govinda R  Referring Physician(s): Cammie Sickle  Supervising Physician: Corrie Mckusick  Patient Status: Outpatient  History of Present Illness: Troy Reynolds is a 65 y.o. male with Lymphoma. He is referred for bone marrow biopsy PMHx, meds, labs, allergies reviewed Has been NPO today Wife at bedside  Past Medical History:  Diagnosis Date  . Cancer ([place])    lymphoma  . GERD (gastroesophageal reflux disease)   . Hypertension     Past Surgical History:  Procedure Laterality Date  . BOWEL RESECTION N/A 06/19/2016   Procedure: SMALL BOWEL RESECTION;  Surgeon: Hubbard Robinson, MD;  Location: [facility] ORS;  Service: General;  Laterality: N/A;  . CHOLECYSTECTOMY    . LAPAROSCOPIC REMOVAL OF MESENTERIC MASS N/A 06/19/2016   Procedure: LAPAROSCOPIC REMOVAL OF MESENTERIC MASS;  Surgeon: Hubbard Robinson, MD;  Location: [facility] ORS;  Service: General;  Laterality: N/A;  . LYMPH NODE DISSECTION    . PORT-A-CATH REMOVAL    . PORTACATH PLACEMENT      Allergies: Review of patient's allergies indicates no known allergies.  Medications: Prior to Admission medications   Medication Sig Start Date End Date Taking? Authorizing Provider  diphenhydramine-acetaminophen (TYLENOL PM) 25-500 MG TABS tablet Take 1 tablet by mouth at bedtime as needed.   Yes Historical Provider, MD  loratadine (CLARITIN) 10 MG tablet Take 10 mg by mouth daily.   Yes Historical Provider, MD  losartan-hydrochlorothiazide (HYZAAR) 50-12.5 MG per tablet TAKE 1 TABLET DAILY. 11/20/14  Yes Historical Provider, MD  Multiple Vitamin (MULTIVITAMIN WITH MINERALS) TABS tablet Take 1 tablet by mouth daily.   Yes Historical Provider, MD  pantoprazole (PROTONIX) 40 MG tablet Take 40 mg by mouth daily.  02/26/15  Yes Historical Provider, MD  polyethylene glycol (MIRALAX / GLYCOLAX) packet Take 17 g by mouth daily.    Yes Historical Provider, MD  ranitidine (ZANTAC) 150 MG tablet Take 150 mg by mouth at bedtime as needed for heartburn.    Yes Historical Provider, MD  fluticasone (FLONASE) 50 MCG/ACT nasal spray Place 1 spray into both nostrils daily as needed for allergies or rhinitis.    Historical Provider, MD     Family History  Problem Relation Age of Onset  . Clotting disorder Mother   . Lymphoma Father   . Hiatal hernia Father   . Heart disease Father     stent placed  . Cancer Paternal Uncle     esophagus  . Brain cancer Maternal Aunt     Social History   Social History  . Marital status: Married    Spouse name: N/A  . Number of children: N/A  . Years of education: N/A   Social History Main Topics  . Smoking status: Never Smoker  . Smokeless tobacco: Never Used  . Alcohol use No  . Drug use: No  . Sexual activity: Not Asked   Other Topics Concern  . None   Social History Narrative  . None     Review of Systems: A 12 point ROS discussed and pertinent positives are indicated in the HPI above.  All other systems are negative.  Review of Systems  Vital Signs: BP (!) 143/87 (BP Location: Right Arm)   Pulse 89   Temp 98.1 F (36.7 C) (Oral)   Resp 18   Wt 196 lb (88.9 kg)   SpO2 97%   BMI (P) 25.86 kg/m  Physical Exam  Constitutional: He is oriented to person, place, and time. He appears well-developed and well-nourished. No distress.  HENT:  Head: Normocephalic.  Mouth/Throat: Oropharynx is clear and moist.  Neck: Normal range of motion. No JVD present. No tracheal deviation present.  Cardiovascular: Normal rate, regular rhythm and normal heart sounds.   Pulmonary/Chest: Effort normal and breath sounds normal. No respiratory distress. He has no wheezes. He has no rales.  Neurological: He is alert and oriented to person, place, and time.  Skin: Skin is warm and dry.  Psychiatric: He has a normal mood and affect. Judgment normal.    Mallampati Score:  MD  Evaluation Airway: WNL Heart: WNL Abdomen: WNL Chest/ Lungs: WNL ASA  Classification: 2 Mallampati/Airway Score: One  Imaging: No results found.  Labs:  CBC:  Recent Labs  01/10/16 1447 05/28/16 1654 06/20/16 0403 07/08/16 0920  WBC 5.5 7.1 14.5* 6.9  HGB 16.0 15.7 13.1 14.7  HCT 45.3 45.9 38.7* 44.2  PLT 220 206 213 422*    COAGS:  Recent Labs  07/08/16 0920  INR 0.96    BMP:  Recent Labs  09/12/15 1528 01/10/16 1447 05/28/16 1654 06/20/16 0403 06/23/16 0526  NA 134* 135 138 136  --   K 3.8 3.4* 3.7 4.0  --   CL 102 104 103 104  --   CO2 27 26 27 25  --   GLUCOSE 103* 126* 94 127*  --   BUN 17 16 16 20  --   CALCIUM 8.0* 8.5* 8.9 8.0*  --   CREATININE 1.07 1.01 0.89 0.99 0.77  GFRNONAA >60 >60 >60 >60 >60  GFRAA >60 >60 >60 >60 >60    LIVER FUNCTION TESTS:  Recent Labs  07/24/15 1605 09/12/15 1528 01/10/16 1447 05/28/16 1654  BILITOT 0.4 0.7 0.6 0.4  AST 26 24 22 22  ALT 23 24 30 29  ALKPHOS 74 72 78 72  PROT 6.7 7.2 7.2 7.1  ALBUMIN 4.3 4.3 4.5 4.3    TUMOR MARKERS: No results for input(s): AFPTM, CEA, CA199, CHROMGRNA in the last 8760 hours.  Assessment and Plan: Lymphoma For CT guided bone marrow biopsy Labs ok Risks and Benefits discussed with the patient including, but not limited to bleeding, infection, damage to adjacent structures or low yield requiring additional tests. All of the patient's questions were answered, patient is agreeable to proceed. Consent signed and in chart.    Thank you for this interesting consult.  I greatly enjoyed meeting Ismaeel L Bolio and look forward to participating in their care.  A copy of this report was sent to the requesting provider on this date.  Electronically Signed: ,  07/08/2016, 9:58 AM   I spent a total of 20 minutes in face to face in clinical consultation, greater than 50% of which was counseling/coordinating care for bone marrow biopsy        

## 2016-07-10 LAB — SURGICAL PATHOLOGY

## 2016-07-15 ENCOUNTER — Other Ambulatory Visit: Payer: Self-pay

## 2016-07-15 DIAGNOSIS — C8301 Small cell B-cell lymphoma, lymph nodes of head, face, and neck: Secondary | ICD-10-CM

## 2016-07-16 ENCOUNTER — Inpatient Hospital Stay: Payer: 59

## 2016-07-16 ENCOUNTER — Inpatient Hospital Stay (HOSPITAL_BASED_OUTPATIENT_CLINIC_OR_DEPARTMENT_OTHER): Payer: 59 | Admitting: Internal Medicine

## 2016-07-16 DIAGNOSIS — C8301 Small cell B-cell lymphoma, lymph nodes of head, face, and neck: Secondary | ICD-10-CM

## 2016-07-16 DIAGNOSIS — C8203 Follicular lymphoma grade I, intra-abdominal lymph nodes: Secondary | ICD-10-CM | POA: Diagnosis not present

## 2016-07-16 DIAGNOSIS — Z9221 Personal history of antineoplastic chemotherapy: Secondary | ICD-10-CM | POA: Diagnosis not present

## 2016-07-16 DIAGNOSIS — Z8572 Personal history of non-Hodgkin lymphomas: Secondary | ICD-10-CM | POA: Diagnosis not present

## 2016-07-16 DIAGNOSIS — Z79899 Other long term (current) drug therapy: Secondary | ICD-10-CM

## 2016-07-16 DIAGNOSIS — Z923 Personal history of irradiation: Secondary | ICD-10-CM

## 2016-07-16 LAB — COMPREHENSIVE METABOLIC PANEL
ALBUMIN: 4.4 g/dL (ref 3.5–5.0)
ALT: 27 U/L (ref 17–63)
AST: 21 U/L (ref 15–41)
Alkaline Phosphatase: 75 U/L (ref 38–126)
Anion gap: 9 (ref 5–15)
BUN: 13 mg/dL (ref 6–20)
CHLORIDE: 102 mmol/L (ref 101–111)
CO2: 29 mmol/L (ref 22–32)
CREATININE: 0.84 mg/dL (ref 0.61–1.24)
Calcium: 8.8 mg/dL — ABNORMAL LOW (ref 8.9–10.3)
GFR calc Af Amer: >60 mL/min (ref >60)
GLUCOSE: 114 mg/dL — AB (ref 65–99)
POTASSIUM: 4.1 mmol/L (ref 3.5–5.1)
Sodium: 140 mmol/L (ref 135–145)
Total Bilirubin: 0.5 mg/dL (ref 0.3–1.2)
Total Protein: 7.3 g/dL (ref 6.5–8.1)

## 2016-07-16 LAB — CBC WITH DIFFERENTIAL/PLATELET
BASOS ABS: 0.1 10*3/uL (ref 0–0.1)
Basophils Relative: 1 %
Eosinophils Absolute: 0.3 10*3/uL (ref 0–0.7)
Eosinophils Relative: 5 %
HEMATOCRIT: 43.4 % (ref 40.0–52.0)
Hemoglobin: 14.8 g/dL (ref 13.0–18.0)
LYMPHS ABS: 1.2 10*3/uL (ref 1.0–3.6)
LYMPHS PCT: 22 %
MCH: 29.3 pg (ref 26.0–34.0)
MCHC: 34.1 g/dL (ref 32.0–36.0)
MCV: 85.8 fL (ref 80.0–100.0)
Monocytes Absolute: 0.6 10*3/uL (ref 0.2–1.0)
Monocytes Relative: 10 %
NEUTROS ABS: 3.4 10*3/uL (ref 1.4–6.5)
Neutrophils Relative %: 62 %
PLATELETS: 273 10*3/uL (ref 150–440)
RBC: 5.06 MIL/uL (ref 4.40–5.90)
RDW: 14.2 % (ref 11.5–14.5)
WBC: 5.6 10*3/uL (ref 3.8–10.6)

## 2016-07-16 LAB — LACTATE DEHYDROGENASE: LDH: 162 U/L (ref 98–192)

## 2016-07-16 NOTE — Assessment & Plan Note (Signed)
#  J small bowel mesenteric/ lymph node- excisional biopsy follicular lymphoma- G-1.  Bone marrow biopsy negative.  Stage I follicle lymphoma.  Recommend involved field radiaton.  Discussed with he patient and family in detail.  reCommend hoding off any chemotherapy at this time.  discussd with Dr. Donella Stade.  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 Surveillance PET scan shows- jejunal PET avid progressive.    Patient follow-up with me tentatively in 2 months/no labs.

## 2016-07-16 NOTE — Progress Notes (Signed)
[place] OFFICE PROGRESS NOTE  Patient Care Team: Rusty Aus, MD as PCP - General (Internal Medicine) Hubbard Robinson, MD as Consulting Physician (Surgery)  No matching staging information was found for the patient.   Oncology History   OCT 2014- RIGHT NECK NODE [Dr.McQueen]:STAGE IA [BMBx-NEG]  MALIGANT LYMPHOMA [-DIFFUSE LARGE B CELL LYMPHOMA (44%); -FOLLICULAR LYMPHOMA, GRADES 3A AND 3B (40%)] - RCHOP  x4 + IFRT  # June 2017- PET- Isolated Jejunal LN uptake- [Dr.Loflin]- "follicular lymphoma I-3;KVQQ- NEG. Recm RT  # ?      B-cell lymphoma ([place])   08/25/2014 Initial Diagnosis    B-cell lymphoma ([place])       DLBCL (diffuse large B cell lymphoma) ([place])   05/27/2016 Initial Diagnosis    DLBCL (diffuse large B cell lymphoma) ([place])        INTERVAL HISTORY:  Troy Reynolds 65 y.o.  male pleasant patient above history of Diffuse large B-cell lymphoma; noted to have a lymph node in the abdomen/jejunum PET avid- Status post external biopsy positive for focal lymphoma grade 1. Patient had a bone marrow biopsy/history reviewed the results and also the next plan of care.   He complains of mild abdominal discomfort at the site of surgery. Especially with exertion/moving heavy objects.; He otherwise denies any unusual abdominal pain nausea vomiting. Denies any constipation or diarrhea.No fevers or chills. No weight loss or night sweats.   REVIEW OF SYSTEMS:  A complete 10 point review of system is done which is negative except mentioned above/history of present illness.   PAST MEDICAL HISTORY :  Past Medical History:  Diagnosis Date  . Cancer ([place])    lymphoma  . GERD (gastroesophageal reflux disease)   . Hypertension     PAST SURGICAL HISTORY :   Past Surgical History:  Procedure Laterality Date  . BOWEL RESECTION N/A 06/19/2016   Procedure: SMALL BOWEL RESECTION;  Surgeon: Hubbard Robinson, MD;  Location: [facility] ORS;  Service: General;  Laterality: N/A;   . CHOLECYSTECTOMY    . LAPAROSCOPIC REMOVAL OF MESENTERIC MASS N/A 06/19/2016   Procedure: LAPAROSCOPIC REMOVAL OF MESENTERIC MASS;  Surgeon: Hubbard Robinson, MD;  Location: [facility] ORS;  Service: General;  Laterality: N/A;  . LYMPH NODE DISSECTION    . PORT-A-CATH REMOVAL    . PORTACATH PLACEMENT      FAMILY HISTORY :   Family History  Problem Relation Age of Onset  . Clotting disorder Mother   . Lymphoma Father   . Hiatal hernia Father   . Heart disease Father     stent placed  . Cancer Paternal Uncle     esophagus  . Brain cancer Maternal Aunt     SOCIAL HISTORY:   Social History  Substance Use Topics  . Smoking status: Never Smoker  . Smokeless tobacco: Never Used  . Alcohol use No    ALLERGIES:  has No Known Allergies.  MEDICATIONS:  Current Outpatient Prescriptions  Medication Sig Dispense Refill  . diphenhydramine-acetaminophen (TYLENOL PM) 25-500 MG TABS tablet Take 1 tablet by mouth at bedtime as needed.    . fluticasone (FLONASE) 50 MCG/ACT nasal spray Place 1 spray into both nostrils daily as needed for allergies or rhinitis.    Marland Kitchen loratadine (CLARITIN) 10 MG tablet Take 10 mg by mouth daily.    Marland Kitchen losartan-hydrochlorothiazide (HYZAAR) 50-12.5 MG per tablet TAKE 1 TABLET DAILY.    . Multiple Vitamin (MULTIVITAMIN WITH MINERALS) TABS tablet Take 1 tablet by mouth  daily.    . pantoprazole (PROTONIX) 40 MG tablet Take 40 mg by mouth daily.     . polyethylene glycol (MIRALAX / GLYCOLAX) packet Take 17 g by mouth daily.    . ranitidine (ZANTAC) 150 MG tablet Take 150 mg by mouth at bedtime as needed for heartburn.      No current facility-administered medications for this visit.     PHYSICAL EXAMINATION: ECOG PERFORMANCE STATUS: 0 - Asymptomatic  BP 138/88 (BP Location: Left Arm, Patient Position: Sitting)   Pulse 80   Temp (!) 95.6 F (35.3 C) (Tympanic)   Resp 17   Ht 6' 1"  (1.854 m)   Wt 200 lb 6.4 oz (90.9 kg)   BMI 26.44 kg/m   Filed Weights    07/16/16 1539  Weight: 200 lb 6.4 oz (90.9 kg)    GENERAL: Well-nourished well-developed; Alert, no distress and comfortable.  With his wife.  EYES: no pallor or icterus OROPHARYNX: no thrush or ulceration; good dentition  NECK: supple, no masses felt LYMPH:  no palpable lymphadenopathy in the cervical, axillary or inguinal regions LUNGS: clear to auscultation and  No wheeze or crackles HEART/CVS: regular rate & rhythm and no murmurs; No lower extremity edema ABDOMEN:abdomen soft, non-tender and normal bowel sounds; surgical incision well healing. Musculoskeletal:no cyanosis of digits and no clubbing  PSYCH: alert & oriented x 3 with fluent speech NEURO: no focal motor/sensory deficits SKIN:  no rashes or significant lesions  LABORATORY DATA:  I have reviewed the data as listed    Component Value Date/Time   NA 140 07/16/2016 1522   NA 140 03/12/2015 1007   K 4.1 07/16/2016 1522   K 3.7 03/12/2015 1007   CL 102 07/16/2016 1522   CL 105 03/12/2015 1007   CO2 29 07/16/2016 1522   CO2 29 03/12/2015 1007   GLUCOSE 114 (H) 07/16/2016 1522   GLUCOSE 143 (H) 03/12/2015 1007   BUN 13 07/16/2016 1522   BUN 15 03/12/2015 1007   CREATININE 0.84 07/16/2016 1522   CREATININE 0.92 03/12/2015 1007   CALCIUM 8.8 (L) 07/16/2016 1522   CALCIUM 8.6 (L) 03/12/2015 1007   PROT 7.3 07/16/2016 1522   PROT 6.6 03/12/2015 1007   ALBUMIN 4.4 07/16/2016 1522   ALBUMIN 4.0 03/12/2015 1007   AST 21 07/16/2016 1522   AST 26 03/12/2015 1007   ALT 27 07/16/2016 1522   ALT 37 03/12/2015 1007   ALKPHOS 75 07/16/2016 1522   ALKPHOS 79 03/12/2015 1007   BILITOT 0.5 07/16/2016 1522   BILITOT 0.8 03/12/2015 1007   GFRNONAA >60 07/16/2016 1522   GFRNONAA >60 03/12/2015 1007   GFRAA >60 07/16/2016 1522   GFRAA >60 03/12/2015 1007    No results found for: SPEP, UPEP  Lab Results  Component Value Date   WBC 5.6 07/16/2016   NEUTROABS 3.4 07/16/2016   HGB 14.8 07/16/2016   HCT 43.4 07/16/2016    MCV 85.8 07/16/2016   PLT 273 07/16/2016      Chemistry      Component Value Date/Time   NA 140 07/16/2016 1522   NA 140 03/12/2015 1007   K 4.1 07/16/2016 1522   K 3.7 03/12/2015 1007   CL 102 07/16/2016 1522   CL 105 03/12/2015 1007   CO2 29 07/16/2016 1522   CO2 29 03/12/2015 1007   BUN 13 07/16/2016 1522   BUN 15 03/12/2015 1007   CREATININE 0.84 07/16/2016 1522   CREATININE 0.92 03/12/2015 1007  Component Value Date/Time   CALCIUM 8.8 (L) 07/16/2016 1522   CALCIUM 8.6 (L) 03/12/2015 1007   ALKPHOS 75 07/16/2016 1522   ALKPHOS 79 03/12/2015 1007   AST 21 07/16/2016 1522   AST 26 03/12/2015 1007   ALT 27 07/16/2016 1522   ALT 37 03/12/2015 1007   BILITOT 0.5 07/16/2016 1522   BILITOT 0.8 03/12/2015 1007       RADIOGRAPHIC STUDIES: I have personally reviewed the radiological images as listed and agreed with the findings in the report. No results found.   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  Bone marrow biopsy negative.  Stage I follicle lymphoma.  Recommend involved field radiaton.  Discussed with he patient and family in detail.  reCommend hoding off any chemotherapy at this time.  discussd with Dr. Donella Stade.  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 Surveillance PET scan shows- jejunal PET avid progressive.    Patient follow-up with me tentatively in 2 months/no labs.    Orders Placed This Encounter  Procedures  . CBC with Differential    Standing Status:   Future    Standing Expiration Date:   07/16/2017  . Comprehensive metabolic panel    Standing Status:   Future    Standing Expiration Date:   07/16/2017   All questions were answered. The patient knows to call the clinic with any problems, questions or concerns.      Cammie Sickle, MD 07/16/2016 5:08 PM

## 2016-07-16 NOTE — Progress Notes (Signed)
Bone Marrow was done on 8/15 here for results

## 2016-07-17 LAB — CHROMOSOME ANALYSIS, BONE MARROW

## 2016-07-18 ENCOUNTER — Telehealth: Payer: Self-pay | Admitting: Internal Medicine

## 2016-07-18 NOTE — Telephone Encounter (Signed)
x

## 2016-07-21 ENCOUNTER — Telehealth: Payer: Self-pay | Admitting: *Deleted

## 2016-07-21 NOTE — Telephone Encounter (Signed)
Called patient and LVM that patient's case was presented at tumor conference.  It was decided to hold off on radiation at this time, however, MD recommends patient keep appointment with radiology oncologist.

## 2016-07-21 NOTE — Telephone Encounter (Signed)
-----   Message from Cammie Sickle, MD sent at 07/18/2016  7:47 PM EDT ----- Juluis Rainier- I tried to reach patient regarding the discussion at tumor conference; regarding holding off of radiation. However would encourage the patient to keep the appointment with radiation oncology.

## 2016-07-23 ENCOUNTER — Ambulatory Visit
Admission: RE | Admit: 2016-07-23 | Discharge: 2016-07-23 | Disposition: A | Discharge status: Home / Self Care | Payer: 59 | Source: Ambulatory Visit | Attending: Radiation Oncology | Admitting: Radiation Oncology

## 2016-07-23 ENCOUNTER — Encounter: Payer: Self-pay | Admitting: Surgery

## 2016-07-23 ENCOUNTER — Encounter (HOSPITAL_COMMUNITY): Payer: Self-pay

## 2016-07-23 ENCOUNTER — Encounter: Payer: Self-pay | Admitting: Radiation Oncology

## 2016-07-23 VITALS — BP 142/80 | HR 80 | Temp 97.0°F | Resp 20 | Wt 203.6 lb

## 2016-07-23 DIAGNOSIS — C8203 Follicular lymphoma grade I, intra-abdominal lymph nodes: Secondary | ICD-10-CM

## 2016-07-23 NOTE — Consult Note (Signed)
Except an outstanding is perfect of Radiation Oncology NEW PATIENT EVALUATION  Name: Troy Reynolds  MRN: [id number]  Date:   07/23/2016     DOB: 1951-11-17   This 65 y.o. male patient presents to the clinic for initial evaluation of B-cell follicular lymphoma of the abdomen status post resection in patient previous he treated for lymph Lichty lymphoma to right supraclavicular region as well as bilateral neck with external beam treatment back in 2015  REFERRING PHYSICIAN: Rusty Aus, MD  CHIEF COMPLAINT:  Chief Complaint  Patient presents with  . 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   PREVIOUS INVESTIGATIONS:  PET CT scans in serial fashion reviewed Pathology reports reviewed Clinical notes reviewed  HPI: Patient is a 65 year old male well-known to department having been treated to his right supraclavicular fossa as well as bilateral next back in 3646 for follicular lymphoma B cell type see deep 20 positive status post R CHOP chemotherapy we treated involved field and he tolerated his treatments well and has done extremely well. In follow-ups PET CT scans he was noted to have an isolated area of hypermetabolic activity in the jejunal lymph node region and underwent resection again for follicular B-cell lymphoma. He had a bone marrow biopsy repeated also negative for B-cell lymphoma involvement. Patient does have history of irritable bowel syndrome. He had resection of lymph nodes and small bowel again showing B cell lymphoma. His case was presented at our tumor conference and he is now referred to radiation oncology for opinion. He is doing well he is having no B type symptoms no fever chills night sweats.  PLANNED TREATMENT REGIMEN: Observation  PAST MEDICAL HISTORY:  has a past medical history of Cancer ([place]); GERD (gastroesophageal reflux disease); and Hypertension.     PAST SURGICAL HISTORY:  Past Surgical History:  Procedure Laterality Date  . BOWEL RESECTION N/A 06/19/2016   Procedure: SMALL BOWEL RESECTION;  Surgeon: Hubbard Robinson, MD;  Location: [facility] ORS;  Service: General;  Laterality: N/A;  . CHOLECYSTECTOMY    . LAPAROSCOPIC REMOVAL OF MESENTERIC MASS N/A 06/19/2016   Procedure: LAPAROSCOPIC REMOVAL OF MESENTERIC MASS;  Surgeon: Hubbard Robinson, MD;  Location: [facility] ORS;  Service: General;  Laterality: N/A;  . LYMPH NODE DISSECTION    . PORT-A-CATH REMOVAL    . PORTACATH PLACEMENT      FAMILY HISTORY: family history includes Brain cancer in his maternal aunt; Cancer in his paternal uncle; Clotting disorder in his mother; Heart disease in his father; Hiatal hernia in his father; Lymphoma in his father.  SOCIAL HISTORY:  reports that he has never smoked. He has never used smokeless tobacco. He reports that he does not drink alcohol or use drugs.  ALLERGIES: Review of patient's allergies indicates no known allergies.  MEDICATIONS:  Current Outpatient Prescriptions  Medication Sig Dispense Refill  . diphenhydramine-acetaminophen (TYLENOL PM) 25-500 MG TABS tablet Take 1 tablet by mouth at bedtime as needed.    . fluticasone (FLONASE) 50 MCG/ACT nasal spray Place 1 spray into both nostrils daily as needed for allergies or rhinitis.    Marland Kitchen loratadine (CLARITIN) 10 MG tablet Take 10 mg by mouth daily.    Marland Kitchen losartan-hydrochlorothiazide (HYZAAR) 50-12.5 MG per tablet TAKE 1 TABLET DAILY.    . Multiple Vitamin (MULTIVITAMIN WITH MINERALS) TABS tablet Take 1 tablet by mouth daily.    Marland Kitchen  pantoprazole (PROTONIX) 40 MG tablet Take 40 mg by mouth daily.     . polyethylene glycol (MIRALAX / GLYCOLAX) packet Take 17 g by mouth daily.    . ranitidine (ZANTAC) 150 MG tablet Take 150 mg by mouth at bedtime as needed for heartburn.      No current facility-administered medications for this encounter.     ECOG PERFORMANCE STATUS:  0 -  Asymptomatic  REVIEW OF SYSTEMS:  Patient denies any weight loss, fatigue, weakness, fever, chills or night sweats. Patient denies any loss of vision, blurred vision. Patient denies any ringing  of the ears or hearing loss. No irregular heartbeat. Patient denies heart murmur or history of fainting. Patient denies any chest pain or pain radiating to her upper extremities. Patient denies any shortness of breath, difficulty breathing at night, cough or hemoptysis. Patient denies any swelling in the lower legs. Patient denies any nausea vomiting, vomiting of blood, or coffee ground material in the vomitus. Patient denies any stomach pain. Patient states has had normal bowel movements no significant constipation or diarrhea. Patient denies any dysuria, hematuria or significant nocturia. Patient denies any problems walking, swelling in the joints or loss of balance. Patient denies any skin changes, loss of hair or loss of weight. Patient denies any excessive worrying or anxiety or significant depression. Patient denies any problems with insomnia. Patient denies excessive thirst, polyuria, polydipsia. Patient denies any swollen glands, patient denies easy bruising or easy bleeding. Patient denies any recent infections, allergies or URI. Patient "s visual fields have not changed significantly in recent time.    PHYSICAL EXAM: BP (!) 142/80   Pulse 80   Temp 97 F (36.1 C)   Resp 20   Wt 203 lb 9.5 oz (92.4 kg)   BMI 26.86 kg/m no peripheral adenopathy is detected in any lymph node chain. Well-developed well-nourished patient in NAD. HEENT reveals PERLA, EOMI, discs not visualized.  Oral cavity is clear. No oral mucosal lesions are identified. Neck is clear without evidence of cervical or supraclavicular adenopathy. Lungs are clear to A&P. Cardiac examination is essentially unremarkable with regular rate and rhythm without murmur rub or thrill. Abdomen is benign with no organomegaly or masses noted. Motor  sensory and DTR levels are equal and symmetric in the upper and lower extremities. Cranial nerves II through XII are grossly intact. Proprioception is intact. No peripheral adenopathy or edema is identified. No motor or sensory levels are noted. Crude visual fields are within normal range.  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 I would opt to observe the patient and repeat his PET/CT scan in 4 months or so and if there is residual hypermetabolic activity in this area could offer a limited field of radiation therapy at that time. He certainly will need continued PET/CT surveillance based on his history of progressive or recurrent disease. I discussed the case personally with medical oncology and he agrees with my treatment plan. I've set up a follow-up in 4 months with the patient. Would be happy to reevaluate him at any time should progressive disease be detected.  I  would like to take this opportunity to thank you for allowing me to participate in the care of your patient.Armstead Peaks., MD

## 2016-07-31 ENCOUNTER — Telehealth: Payer: Self-pay

## 2016-07-31 NOTE — Telephone Encounter (Signed)
Disability Form was filled out and faxed to Tawni Levy from [employer] and [employer].

## 2016-08-13 ENCOUNTER — Other Ambulatory Visit: Payer: Self-pay | Admitting: Gastroenterology

## 2016-08-13 DIAGNOSIS — K5900 Constipation, unspecified: Secondary | ICD-10-CM

## 2016-08-28 ENCOUNTER — Ambulatory Visit
Admission: RE | Admit: 2016-08-28 | Discharge: 2016-08-28 | Disposition: A | Discharge status: Home / Self Care | Payer: 59 | Source: Ambulatory Visit | Attending: Gastroenterology | Admitting: Gastroenterology

## 2016-08-28 DIAGNOSIS — K5900 Constipation, unspecified: Secondary | ICD-10-CM | POA: Diagnosis not present

## 2016-08-28 DIAGNOSIS — K5710 Diverticulosis of small intestine without perforation or abscess without bleeding: Secondary | ICD-10-CM | POA: Diagnosis not present

## 2016-09-15 ENCOUNTER — Inpatient Hospital Stay: Payer: 59

## 2016-09-15 ENCOUNTER — Ambulatory Visit: Payer: 59 | Admitting: Internal Medicine

## 2016-09-15 ENCOUNTER — Inpatient Hospital Stay: Payer: 59 | Admitting: Internal Medicine

## 2016-09-15 ENCOUNTER — Other Ambulatory Visit: Payer: 59

## 2016-10-06 ENCOUNTER — Ambulatory Visit: Payer: 59 | Admitting: Internal Medicine

## 2016-10-06 ENCOUNTER — Other Ambulatory Visit: Payer: 59

## 2016-10-09 ENCOUNTER — Inpatient Hospital Stay: Payer: 59

## 2016-10-09 ENCOUNTER — Inpatient Hospital Stay: Payer: 59 | Attending: Internal Medicine | Admitting: Internal Medicine

## 2016-10-09 VITALS — BP 120/80 | HR 75 | Temp 97.0°F | Resp 18 | Wt 206.6 lb

## 2016-10-09 DIAGNOSIS — G4733 Obstructive sleep apnea (adult) (pediatric): Secondary | ICD-10-CM

## 2016-10-09 DIAGNOSIS — K219 Gastro-esophageal reflux disease without esophagitis: Secondary | ICD-10-CM | POA: Insufficient documentation

## 2016-10-09 DIAGNOSIS — Z923 Personal history of irradiation: Secondary | ICD-10-CM | POA: Diagnosis not present

## 2016-10-09 DIAGNOSIS — R946 Abnormal results of thyroid function studies: Secondary | ICD-10-CM | POA: Diagnosis not present

## 2016-10-09 DIAGNOSIS — Z9221 Personal history of antineoplastic chemotherapy: Secondary | ICD-10-CM | POA: Diagnosis not present

## 2016-10-09 DIAGNOSIS — Z79899 Other long term (current) drug therapy: Secondary | ICD-10-CM | POA: Insufficient documentation

## 2016-10-09 DIAGNOSIS — I10 Essential (primary) hypertension: Secondary | ICD-10-CM | POA: Diagnosis not present

## 2016-10-09 DIAGNOSIS — C8203 Follicular lymphoma grade I, intra-abdominal lymph nodes: Secondary | ICD-10-CM

## 2016-10-09 LAB — COMPREHENSIVE METABOLIC PANEL
ALBUMIN: 4.2 g/dL (ref 3.5–5.0)
ALT: 28 U/L (ref 17–63)
AST: 27 U/L (ref 15–41)
Alkaline Phosphatase: 68 U/L (ref 38–126)
Anion gap: 6 (ref 5–15)
BUN: 14 mg/dL (ref 6–20)
CHLORIDE: 107 mmol/L (ref 101–111)
CO2: 28 mmol/L (ref 22–32)
Calcium: 9.1 mg/dL (ref 8.9–10.3)
Creatinine, Ser: 0.87 mg/dL (ref 0.61–1.24)
GFR calc Af Amer: >60 mL/min (ref >60)
GFR calc non Af Amer: >60 mL/min (ref >60)
GLUCOSE: 92 mg/dL (ref 65–99)
POTASSIUM: 3.6 mmol/L (ref 3.5–5.1)
SODIUM: 141 mmol/L (ref 135–145)
Total Bilirubin: 0.8 mg/dL (ref 0.3–1.2)
Total Protein: 7 g/dL (ref 6.5–8.1)

## 2016-10-09 LAB — TSH: TSH: 4.842 u[IU]/mL — AB (ref 0.350–4.500)

## 2016-10-09 LAB — CBC WITH DIFFERENTIAL/PLATELET
BASOS ABS: 0.1 10*3/uL (ref 0–0.1)
BASOS PCT: 1 %
EOS PCT: 4 %
Eosinophils Absolute: 0.2 10*3/uL (ref 0–0.7)
HCT: 47.3 % (ref 40.0–52.0)
Hemoglobin: 16.2 g/dL (ref 13.0–18.0)
Lymphocytes Relative: 20 %
Lymphs Abs: 1.3 10*3/uL (ref 1.0–3.6)
MCH: 28.7 pg (ref 26.0–34.0)
MCHC: 34.3 g/dL (ref 32.0–36.0)
MCV: 83.7 fL (ref 80.0–100.0)
MONO ABS: 0.7 10*3/uL (ref 0.2–1.0)
Monocytes Relative: 11 %
NEUTROS ABS: 4.1 10*3/uL (ref 1.4–6.5)
Neutrophils Relative %: 64 %
PLATELETS: 232 10*3/uL (ref 150–440)
RBC: 5.65 MIL/uL (ref 4.40–5.90)
RDW: 14 % (ref 11.5–14.5)
WBC: 6.5 10*3/uL (ref 3.8–10.6)

## 2016-10-09 NOTE — Progress Notes (Signed)
Patient is here for follow up, he is doing well no complaints  

## 2016-10-09 NOTE — Progress Notes (Signed)
[place] OFFICE PROGRESS NOTE  Patient Care Team: Rusty Aus, MD as PCP - General (Internal Medicine) Hubbard Robinson, MD as Consulting Physician (Surgery)  No matching staging information was found for the patient.   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 Recm surviellance; Dr.Crystal.      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  male pleasant patient above history of Diffuse large B-cell lymphoma; noted to have a lymph node in the abdomen/jejunum PET avid- Status post external biopsy positive for focal lymphoma grade 1/Stage I is here for follow-up  He continues to complain of significant fatigue. Denies any weight loss. Appetite is excellent. He otherwise denies any unusual abdominal pain nausea vomiting. Denies any constipation or diarrhea.No fevers or chills. No night sweats. No new lumps or bumps. Continues to complain of dry cough/postnasal drip. Difficulty sleeping at night/snoring.  REVIEW OF SYSTEMS:  A complete 10 point review of system is done which is negative except mentioned above/history of present illness.   PAST MEDICAL HISTORY :  Past Medical History:  Diagnosis Date  . Cancer ([place])    lymphoma  . GERD (gastroesophageal reflux disease)   . Hypertension     PAST SURGICAL HISTORY :   Past  Surgical History:  Procedure Laterality Date  . BOWEL RESECTION N/A 06/19/2016   Procedure: SMALL BOWEL RESECTION;  Surgeon: Hubbard Robinson, MD;  Location: [facility] ORS;  Service: General;  Laterality: N/A;  . CHOLECYSTECTOMY    . LAPAROSCOPIC REMOVAL OF MESENTERIC MASS N/A 06/19/2016   Procedure: LAPAROSCOPIC REMOVAL OF MESENTERIC MASS;  Surgeon: Hubbard Robinson, MD;  Location: [facility] ORS;  Service: General;  Laterality: N/A;  . LYMPH NODE DISSECTION    . PORT-A-CATH REMOVAL    . PORTACATH PLACEMENT      FAMILY HISTORY :   Family History  Problem Relation Age of Onset  . Clotting disorder Mother   . Lymphoma Father   . Hiatal hernia Father   . Heart disease Father     stent placed  . Cancer Paternal Uncle     esophagus  . Brain cancer Maternal Aunt     SOCIAL HISTORY:   Social History  Substance Use Topics  . Smoking status: Never Smoker  . Smokeless tobacco: Never Used  . Alcohol use No    ALLERGIES:  has No Known Allergies.  MEDICATIONS:  Current Outpatient Prescriptions  Medication Sig Dispense Refill  . diphenhydramine-acetaminophen (TYLENOL PM) 25-500 MG TABS tablet Take 1 tablet by mouth at bedtime as needed.    . fluticasone (FLONASE) 50 MCG/ACT nasal spray Place 1 spray into both nostrils daily as needed for allergies or rhinitis.    Marland Kitchen loratadine (CLARITIN) 10 MG tablet Take 10 mg by mouth daily.    Marland Kitchen  losartan-hydrochlorothiazide (HYZAAR) 50-12.5 MG per tablet TAKE 1 TABLET DAILY.    . Multiple Vitamin (MULTIVITAMIN WITH MINERALS) TABS tablet Take 1 tablet by mouth daily.    . pantoprazole (PROTONIX) 40 MG tablet Take 40 mg by mouth daily.     . ranitidine (ZANTAC) 150 MG tablet Take 150 mg by mouth at bedtime as needed for heartburn.     . polyethylene glycol (MIRALAX / GLYCOLAX) packet Take 17 g by mouth daily.     No current facility-administered medications for this visit.     PHYSICAL EXAMINATION: ECOG PERFORMANCE STATUS: 0 - Asymptomatic  BP 120/80  (BP Location: Left Arm, Patient Position: Sitting)   Pulse 75   Temp 97 F (36.1 C) (Tympanic)   Resp 18   Wt 206 lb 9.6 oz (93.7 kg)   BMI 27.26 kg/m   Filed Weights   10/09/16 1545  Weight: 206 lb 9.6 oz (93.7 kg)    GENERAL: Well-nourished well-developed; Alert, no distress and comfortable.  With his wife.  EYES: no pallor or icterus OROPHARYNX: no thrush or ulceration; good dentition  NECK: supple, no masses felt LYMPH:  no palpable lymphadenopathy in the cervical, axillary or inguinal regions LUNGS: clear to auscultation and  No wheeze or crackles HEART/CVS: regular rate & rhythm and no murmurs; No lower extremity edema ABDOMEN:abdomen soft, non-tender and normal bowel sounds; surgical incision well healing. Musculoskeletal:no cyanosis of digits and no clubbing  PSYCH: alert & oriented x 3 with fluent speech NEURO: no focal motor/sensory deficits SKIN:  no rashes or significant lesions  LABORATORY DATA:  I have reviewed the data as listed    Component Value Date/Time   NA 141 10/09/2016 1519   NA 140 03/12/2015 1007   K 3.6 10/09/2016 1519   K 3.7 03/12/2015 1007   CL 107 10/09/2016 1519   CL 105 03/12/2015 1007   CO2 28 10/09/2016 1519   CO2 29 03/12/2015 1007   GLUCOSE 92 10/09/2016 1519   GLUCOSE 143 (H) 03/12/2015 1007   BUN 14 10/09/2016 1519   BUN 15 03/12/2015 1007   CREATININE 0.87 10/09/2016 1519   CREATININE 0.92 03/12/2015 1007   CALCIUM 9.1 10/09/2016 1519   CALCIUM 8.6 (L) 03/12/2015 1007   PROT 7.0 10/09/2016 1519   PROT 6.6 03/12/2015 1007   ALBUMIN 4.2 10/09/2016 1519   ALBUMIN 4.0 03/12/2015 1007   AST 27 10/09/2016 1519   AST 26 03/12/2015 1007   ALT 28 10/09/2016 1519   ALT 37 03/12/2015 1007   ALKPHOS 68 10/09/2016 1519   ALKPHOS 79 03/12/2015 1007   BILITOT 0.8 10/09/2016 1519   BILITOT 0.8 03/12/2015 1007   GFRNONAA >60 10/09/2016 1519   GFRNONAA >60 03/12/2015 1007   GFRAA >60 10/09/2016 1519   GFRAA >60 03/12/2015 1007     No results found for: SPEP, UPEP  Lab Results  Component Value Date   WBC 6.5 10/09/2016   NEUTROABS 4.1 10/09/2016   HGB 16.2 10/09/2016   HCT 47.3 10/09/2016   MCV 83.7 10/09/2016   PLT 232 10/09/2016      Chemistry      Component Value Date/Time   NA 141 10/09/2016 1519   NA 140 03/12/2015 1007   K 3.6 10/09/2016 1519   K 3.7 03/12/2015 1007   CL 107 10/09/2016 1519   CL 105 03/12/2015 1007   CO2 28 10/09/2016 1519   CO2 29 03/12/2015 1007   BUN 14 10/09/2016 1519   BUN 15 03/12/2015 1007  CREATININE 0.87 10/09/2016 1519   CREATININE 0.92 03/12/2015 1007      Component Value Date/Time   CALCIUM 9.1 10/09/2016 1519   CALCIUM 8.6 (L) 03/12/2015 1007   ALKPHOS 68 10/09/2016 1519   ALKPHOS 79 03/12/2015 1007   AST 27 10/09/2016 1519   AST 26 03/12/2015 1007   ALT 28 10/09/2016 1519   ALT 37 03/12/2015 1007   BILITOT 0.8 10/09/2016 1519   BILITOT 0.8 03/12/2015 1007       RADIOGRAPHIC STUDIES: I have personally reviewed the radiological images as listed and agreed with the findings in the report. No results found.   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  Bone marrow biopsy negative.  Stage I follicle lymphoma; s/p RT evaluation; NO RT. Await repeat PET scan for possible need for RT.   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 No clinical evidence of recurrence.  # fatigue- recent TSH elevated at 7.9 [at PCP office]; will add TSH to his labs today; if elevated will inform pt/PCP. ? OSA-   # follow up in 3 weeks; PET week prior.    Orders Placed This Encounter  Procedures  . NM PET Image Restag (PS) Skull Base To Thigh    Standing Status:   Future    Standing Expiration Date:   12/09/2017    Order Specific Question:   Reason for Exam (SYMPTOM  OR DIAGNOSIS REQUIRED)    Answer:    lymphoma    Order Specific Question:   Preferred imaging location?    Answer:   [facility]  . TSH    Add to today's labs    Standing Status:   Future    Number of Occurrences:   1    Standing Expiration Date:   10/09/2017   All questions were answered. The patient knows to call the clinic with any problems, questions or concerns.      Cammie Sickle, MD 10/09/2016 4:21 PM

## 2016-10-09 NOTE — Assessment & Plan Note (Addendum)
#  small bowel mesenteric/ lymph node- excisional biopsy follicular lymphoma- G-1.  Bone marrow biopsy negative.  Stage I follicle lymphoma; s/p RT evaluation; NO RT. Await repeat PET scan for possible need for RT.   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 No clinical evidence of recurrence.  # fatigue- recent TSH elevated at 7.9 [at PCP office]; will add TSH to his labs today; if elevated will inform pt/PCP. ? OSA-   # follow up in 3 weeks; PET week prior.

## 2016-10-20 ENCOUNTER — Ambulatory Visit: Payer: 59

## 2016-10-27 ENCOUNTER — Ambulatory Visit: Payer: 59 | Admitting: Internal Medicine

## 2016-10-31 ENCOUNTER — Encounter
Admission: RE | Admit: 2016-10-31 | Discharge: 2016-10-31 | Disposition: A | Discharge status: Home / Self Care | Payer: 59 | Source: Ambulatory Visit | Attending: Internal Medicine | Admitting: Internal Medicine

## 2016-10-31 ENCOUNTER — Ambulatory Visit: Payer: 59 | Admitting: Internal Medicine

## 2016-10-31 DIAGNOSIS — C8203 Follicular lymphoma grade I, intra-abdominal lymph nodes: Secondary | ICD-10-CM

## 2016-10-31 LAB — GLUCOSE, CAPILLARY: Glucose-Capillary: 94 mg/dL (ref 65–99)

## 2016-10-31 MED ORDER — FLUDEOXYGLUCOSE F - 18 (FDG) INJECTION
12.1800 | Freq: Once | INTRAVENOUS | Status: AC | PRN
  Administered 2016-10-31: 12.18 via INTRAVENOUS

## 2016-11-05 ENCOUNTER — Ambulatory Visit: Payer: 59 | Admitting: Internal Medicine

## 2016-11-10 ENCOUNTER — Inpatient Hospital Stay: Payer: 59 | Attending: Internal Medicine | Admitting: Internal Medicine

## 2016-11-10 VITALS — BP 144/85 | HR 76 | Temp 97.1°F | Ht 73.0 in | Wt 211.4 lb

## 2016-11-10 DIAGNOSIS — Z79899 Other long term (current) drug therapy: Secondary | ICD-10-CM | POA: Diagnosis not present

## 2016-11-10 DIAGNOSIS — Z923 Personal history of irradiation: Secondary | ICD-10-CM | POA: Diagnosis not present

## 2016-11-10 DIAGNOSIS — K219 Gastro-esophageal reflux disease without esophagitis: Secondary | ICD-10-CM | POA: Diagnosis not present

## 2016-11-10 DIAGNOSIS — R946 Abnormal results of thyroid function studies: Secondary | ICD-10-CM | POA: Diagnosis not present

## 2016-11-10 DIAGNOSIS — Z9221 Personal history of antineoplastic chemotherapy: Secondary | ICD-10-CM | POA: Diagnosis not present

## 2016-11-10 DIAGNOSIS — C8203 Follicular lymphoma grade I, intra-abdominal lymph nodes: Secondary | ICD-10-CM | POA: Diagnosis not present

## 2016-11-10 DIAGNOSIS — I10 Essential (primary) hypertension: Secondary | ICD-10-CM | POA: Insufficient documentation

## 2016-11-10 NOTE — Progress Notes (Signed)
Patient here to review PET scan results

## 2016-11-10 NOTE — Assessment & Plan Note (Addendum)
#  small bowel mesenteric/ lymph node- s/p Excision follicular lymphoma- G-1.  Bone marrow biopsy negative.  Stage I follicle lymphoma; NO RT. PET scan in 12th December 2017-NED.  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 No clinical evidence of recurrence.  # fatigue- recent TSH elevated at 7.9 [at PCP office]; repeat TSH- 4.8 [slightly elevated]; recommend continued follow-up with PCP.  # mild tingling/numbness of left hand ? Carpal tunnel; defer to PCP.   # follow up in first week of July; labs/PET prior.   # I reviewed the blood work- with the patient in detail; also reviewed the imaging independently [as summarized above]; and with the patient in detail.

## 2016-11-10 NOTE — Progress Notes (Signed)
[place] OFFICE PROGRESS NOTE  Patient Care Team: Rusty Aus, MD as PCP - General (Internal Medicine) Hubbard Robinson, MD as Consulting Physician (Surgery)  No matching staging information was found for the patient.   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 Recm surviellance; Dr.Crystal.      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  male pleasant patient above history of Diffuse large B-cell lymphoma; noted to have a lymph node in the abdomen/jejunum PET avid- Status post Laparoscopic excision - positive for focal lymphoma grade 1/Stage I is here for follow-up/To review the results of the PET scan.  Patient complains of continued fatigue. He also complains of intermittent tingling and numbness of his left hand. No weight loss. He otherwise denies any unusual abdominal pain nausea vomiting. Denies any constipation or diarrhea.No fevers or chills. No night sweats. No new lumps or bumps.  REVIEW OF SYSTEMS:  A complete 10 point review of system is done which is negative except mentioned above/history of present illness.   PAST MEDICAL HISTORY :  Past Medical History:  Diagnosis Date  . Cancer ([place])    lymphoma  . GERD (gastroesophageal reflux disease)   . Hypertension     PAST SURGICAL HISTORY :   Past Surgical  History:  Procedure Laterality Date  . BOWEL RESECTION N/A 06/19/2016   Procedure: SMALL BOWEL RESECTION;  Surgeon: Hubbard Robinson, MD;  Location: [facility] ORS;  Service: General;  Laterality: N/A;  . CHOLECYSTECTOMY    . LAPAROSCOPIC REMOVAL OF MESENTERIC MASS N/A 06/19/2016   Procedure: LAPAROSCOPIC REMOVAL OF MESENTERIC MASS;  Surgeon: Hubbard Robinson, MD;  Location: [facility] ORS;  Service: General;  Laterality: N/A;  . LYMPH NODE DISSECTION    . PORT-A-CATH REMOVAL    . PORTACATH PLACEMENT      FAMILY HISTORY :   Family History  Problem Relation Age of Onset  . Clotting disorder Mother   . Lymphoma Father   . Hiatal hernia Father   . Heart disease Father     stent placed  . Cancer Paternal Uncle     esophagus  . Brain cancer Maternal Aunt     SOCIAL HISTORY:   Social History  Substance Use Topics  . Smoking status: Never Smoker  . Smokeless tobacco: Never Used  . Alcohol use No    ALLERGIES:  has No Known Allergies.  MEDICATIONS:  Current Outpatient Prescriptions  Medication Sig Dispense Refill  . fluticasone (FLONASE) 50 MCG/ACT nasal spray Place 1 spray into both nostrils daily as needed for allergies or rhinitis.    Marland Kitchen loratadine (CLARITIN) 10 MG tablet Take 10 mg by mouth daily.    Marland Kitchen losartan-hydrochlorothiazide (HYZAAR) 50-12.5 MG per tablet TAKE 1 TABLET DAILY.    Marland Kitchen  MELATONIN PO Take 2 mg by mouth as needed.    . Multiple Vitamin (MULTIVITAMIN WITH MINERALS) TABS tablet Take 1 tablet by mouth daily.    . pantoprazole (PROTONIX) 40 MG tablet Take 40 mg by mouth daily.     . polyethylene glycol (MIRALAX / GLYCOLAX) packet Take 17 g by mouth daily.    . ranitidine (ZANTAC) 150 MG tablet Take 150 mg by mouth at bedtime as needed for heartburn.      No current facility-administered medications for this visit.     PHYSICAL EXAMINATION: ECOG PERFORMANCE STATUS: 0 - Asymptomatic  BP (!) 144/85 (BP Location: Left Arm, Patient Position: Sitting)   Pulse 76   Temp  97.1 F (36.2 C) (Tympanic)   Ht _0  (1.854 m)   Wt 211 lb 6 oz (95.9 kg)   BMI 27.89 kg/m   Filed Weights   11/10/16 1611  Weight: 211 lb 6 oz (95.9 kg)    GENERAL: Well-nourished well-developed; Alert, no distress and comfortable.  With his wife.  EYES: no pallor or icterus OROPHARYNX: no thrush or ulceration; good dentition  NECK: supple, no masses felt LYMPH:  no palpable lymphadenopathy in the cervical, axillary or inguinal regions LUNGS: clear to auscultation and  No wheeze or crackles HEART/CVS: regular rate & rhythm and no murmurs; No lower extremity edema ABDOMEN:abdomen soft, non-tender and normal bowel sounds; surgical incision well healing. Musculoskeletal:no cyanosis of digits and no clubbing  PSYCH: alert & oriented x 3 with fluent speech NEURO: no focal motor/sensory deficits SKIN:  no rashes or significant lesions  LABORATORY DATA:  I have reviewed the data as listed    Component Value Date/Time   NA 141 10/09/2016 1519   NA 140 03/12/2015 1007   K 3.6 10/09/2016 1519   K 3.7 03/12/2015 1007   CL 107 10/09/2016 1519   CL 105 03/12/2015 1007   CO2 28 10/09/2016 1519   CO2 29 03/12/2015 1007   GLUCOSE 92 10/09/2016 1519   GLUCOSE 143 (H) 03/12/2015 1007   BUN 14 10/09/2016 1519   BUN 15 03/12/2015 1007   CREATININE 0.87 10/09/2016 1519   CREATININE 0.92 03/12/2015 1007   CALCIUM 9.1 10/09/2016 1519   CALCIUM 8.6 (L) 03/12/2015 1007   PROT 7.0 10/09/2016 1519   PROT 6.6 03/12/2015 1007   ALBUMIN 4.2 10/09/2016 1519   ALBUMIN 4.0 03/12/2015 1007   AST 27 10/09/2016 1519   AST 26 03/12/2015 1007   ALT 28 10/09/2016 1519   ALT 37 03/12/2015 1007   ALKPHOS 68 10/09/2016 1519   ALKPHOS 79 03/12/2015 1007   BILITOT 0.8 10/09/2016 1519   BILITOT 0.8 03/12/2015 1007   GFRNONAA >60 10/09/2016 1519   GFRNONAA >60 03/12/2015 1007   GFRAA >60 10/09/2016 1519   GFRAA >60 03/12/2015 1007    No results found for: SPEP, UPEP  Lab Results  Component  Value Date   WBC 6.5 10/09/2016   NEUTROABS 4.1 10/09/2016   HGB 16.2 10/09/2016   HCT 47.3 10/09/2016   MCV 83.7 10/09/2016   PLT 232 10/09/2016      Chemistry      Component Value Date/Time   NA 141 10/09/2016 1519   NA 140 03/12/2015 1007   K 3.6 10/09/2016 1519   K 3.7 03/12/2015 1007   CL 107 10/09/2016 1519   CL 105 03/12/2015 1007   CO2 28 10/09/2016 1519   CO2 29 03/12/2015 1007   BUN 14 10/09/2016 1519   BUN  15 03/12/2015 1007   CREATININE 0.87 10/09/2016 1519   CREATININE 0.92 03/12/2015 1007      Component Value Date/Time   CALCIUM 9.1 10/09/2016 1519   CALCIUM 8.6 (L) 03/12/2015 1007   ALKPHOS 68 10/09/2016 1519   ALKPHOS 79 03/12/2015 1007   AST 27 10/09/2016 1519   AST 26 03/12/2015 1007   ALT 28 10/09/2016 1519   ALT 37 03/12/2015 1007   BILITOT 0.8 10/09/2016 1519   BILITOT 0.8 03/12/2015 1007       RADIOGRAPHIC STUDIES: I have personally reviewed the radiological images as listed and agreed with the findings in the report. No results found.   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  Bone marrow biopsy negative.  Stage I follicle lymphoma; NO RT. PET scan in 12th December 2017-NED.  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 No clinical evidence of recurrence.  # fatigue- recent TSH elevated at 7.9 [at PCP office]; repeat TSH- 4.8 [slightly elevated]; recommend continued follow-up with PCP.  # mild tingling/numbness of left hand ? Carpal tunnel; defer to PCP.   # follow up in first week of July; labs/PET prior.   # I reviewed the blood work- with the patient in detail; also reviewed the imaging independently [as summarized above]; and with the patient in detail.    Orders Placed This Encounter  Procedures  . NM PET Image Restag (PS) Skull Base To Thigh    Standing Status:   Future     Standing Expiration Date:   01/10/2018    Order Specific Question:   Reason for Exam (SYMPTOM  OR DIAGNOSIS REQUIRED)    Answer:   lymphoma    Order Specific Question:   Preferred imaging location?    Answer:   [facility]  . CBC with Differential    Standing Status:   Future    Standing Expiration Date:   11/10/2017  . Comprehensive metabolic panel    Standing Status:   Future    Standing Expiration Date:   11/10/2017  . Lactate dehydrogenase    Standing Status:   Future    Standing Expiration Date:   11/10/2017   All questions were answered. The patient knows to call the clinic with any problems, questions or concerns.      Cammie Sickle, MD 11/10/2016 4:53 PM

## 2017-02-19 IMAGING — CT NM PET TUM IMG RESTAG (PS) SKULL BASE T - THIGH
1 of 10 series · 2 of 25 positions shown · non-contrast
Comparison: 07/10/2014

CLINICAL DATA: Subsequent treatment strategy for nodular
lymphocytic poorly differentiated lymphoma of head and neck..

EXAM:
NUCLEAR MEDICINE PET SKULL BASE TO THIGH
TECHNIQUE: 12.6 mCi F-18 FDG was injected intravenously. Full-ring PET imaging
was performed from the skull base to thigh after the radiotracer. CT
data was obtained and used for attenuation correction and anatomic
localization.
FASTING BLOOD GLUCOSE:  Value: 91 mg/dl

[Series 3: ct wb 5.0 b30f · axial · 5.0mm · 0.98mm/px · z∈[-1489,-505]mm · 2 of 329 slices shown]
[im 1/329  brain]
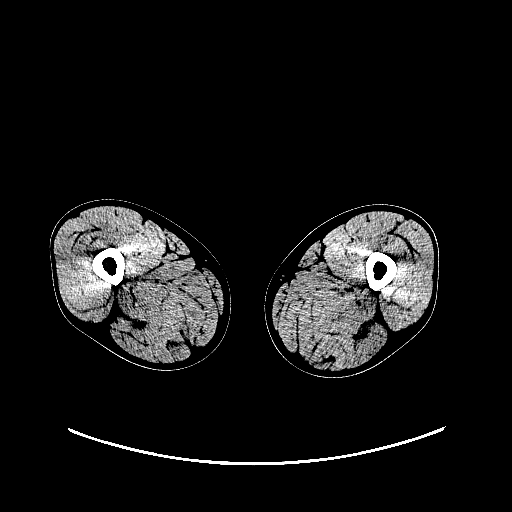
[im 329/329  brain]
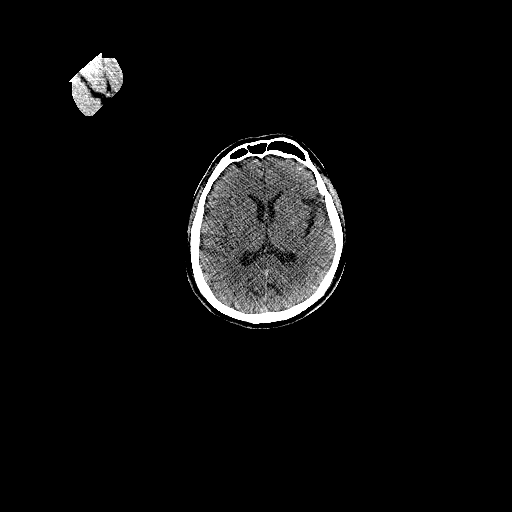

[2 of 25 positions shown; findings below may reference images not displayed]

FINDINGS: NECK

New nasopharyngeal symmetric hypermetabolism without soft tissue
mass. This measures a S.U.V. max of 9.3 on image 25.

Similarly, symmetric bilateral palatini tonsil hypermetabolism is
without dominant CT correlate and measures a S.U.V. max of 14.1.

New bilateral cervical hypermetabolic adenopathy. Left level 2 node
measures 11 mm and a S.U.V. max of 5.2.

A right level 2 node measures 8 mm and a S.U.V. max of 4.7.

Right level 3-4 nodes, including a right-sided node which measures 8
mm and a S.U.V. max of 2.7 on image 66.

CHEST

No areas of abnormal hypermetabolism.

ABDOMEN/PELVIS

No areas of abnormal hypermetabolism.

SKELETON

No abnormal marrow activity.

CT IMAGES PERFORMED FOR ATTENUATION CORRECTION

Left maxillary sinus fluid level. Tortuous thoracic aorta.
Borderline cardiomegaly, without pericardial effusion. Small to
moderate hiatal hernia. No mediastinal, hilar, or axillary
adenopathy. Low-density left renal lesions are likely cysts.
Punctate left renal collecting system calculus. Resolution of
previously described hydronephrosis and passage of previously
described right ureterovesicular junction calculus. Cholecystectomy.
Stable small retroperitoneal nodes. Chronic interstitial thickening
within the small bowel mesenteriyc could relate to treated lymphoma.
Mild prostatomegaly. Bilateral fat containing inguinal hernias.
IMPRESSION: 1. Development of hypermetabolic cervical lymph nodes, consistent
with disease recurrence.
2. No extracervical disease identified.
3. Nasopharyngeal and palatine tonsil hypermetabolism are favored to
be physiologic. Recommend attention on follow-up.
4. Left nephrolithiasis. Interval passage of right ureterovesicular
junction calculus and resolution of mild right-sided hydronephrosis.
5. Hiatal hernia.
6. Sinus disease.

## 2017-05-22 ENCOUNTER — Telehealth: Payer: Self-pay | Admitting: Internal Medicine

## 2017-05-25 ENCOUNTER — Telehealth: Payer: Self-pay | Admitting: Internal Medicine

## 2017-05-25 NOTE — Telephone Encounter (Signed)
Pet approved- [phone] [until 45 days].

## 2017-06-01 ENCOUNTER — Encounter
Admission: RE | Admit: 2017-06-01 | Discharge: 2017-06-01 | Disposition: A | Discharge status: Home / Self Care | Payer: 59 | Source: Ambulatory Visit | Attending: Internal Medicine | Admitting: Internal Medicine

## 2017-06-01 DIAGNOSIS — C8203 Follicular lymphoma grade I, intra-abdominal lymph nodes: Secondary | ICD-10-CM

## 2017-06-01 LAB — GLUCOSE, CAPILLARY: GLUCOSE-CAPILLARY: 82 mg/dL (ref 65–99)

## 2017-06-01 MED ORDER — FLUDEOXYGLUCOSE F - 18 (FDG) INJECTION
12.0000 | Freq: Once | INTRAVENOUS | Status: AC | PRN
  Administered 2017-06-01: 12.61 via INTRAVENOUS

## 2017-06-08 ENCOUNTER — Inpatient Hospital Stay: Payer: 59

## 2017-06-08 ENCOUNTER — Inpatient Hospital Stay: Payer: 59 | Attending: Internal Medicine | Admitting: Internal Medicine

## 2017-06-08 VITALS — BP 130/89 | HR 80 | Temp 97.6°F | Resp 20 | Ht 73.0 in | Wt 210.3 lb

## 2017-06-08 DIAGNOSIS — Z8572 Personal history of non-Hodgkin lymphomas: Secondary | ICD-10-CM

## 2017-06-08 DIAGNOSIS — Z9049 Acquired absence of other specified parts of digestive tract: Secondary | ICD-10-CM

## 2017-06-08 DIAGNOSIS — Z79899 Other long term (current) drug therapy: Secondary | ICD-10-CM

## 2017-06-08 DIAGNOSIS — R5383 Other fatigue: Secondary | ICD-10-CM | POA: Diagnosis not present

## 2017-06-08 DIAGNOSIS — Z800 Family history of malignant neoplasm of digestive organs: Secondary | ICD-10-CM

## 2017-06-08 DIAGNOSIS — R946 Abnormal results of thyroid function studies: Secondary | ICD-10-CM | POA: Diagnosis not present

## 2017-06-08 DIAGNOSIS — Z808 Family history of malignant neoplasm of other organs or systems: Secondary | ICD-10-CM

## 2017-06-08 DIAGNOSIS — Z807 Family history of other malignant neoplasms of lymphoid, hematopoietic and related tissues: Secondary | ICD-10-CM

## 2017-06-08 DIAGNOSIS — K219 Gastro-esophageal reflux disease without esophagitis: Secondary | ICD-10-CM

## 2017-06-08 DIAGNOSIS — I10 Essential (primary) hypertension: Secondary | ICD-10-CM | POA: Diagnosis not present

## 2017-06-08 DIAGNOSIS — C8203 Follicular lymphoma grade I, intra-abdominal lymph nodes: Secondary | ICD-10-CM

## 2017-06-08 LAB — CBC WITH DIFFERENTIAL/PLATELET
BASOS ABS: 0.1 10*3/uL (ref 0–0.1)
Basophils Relative: 1 %
EOS PCT: 4 %
Eosinophils Absolute: 0.3 10*3/uL (ref 0–0.7)
HEMATOCRIT: 43.3 % (ref 40.0–52.0)
Hemoglobin: 15.2 g/dL (ref 13.0–18.0)
LYMPHS ABS: 1.3 10*3/uL (ref 1.0–3.6)
LYMPHS PCT: 23 %
MCH: 29.3 pg (ref 26.0–34.0)
MCHC: 35 g/dL (ref 32.0–36.0)
MCV: 83.7 fL (ref 80.0–100.0)
Monocytes Absolute: 0.5 10*3/uL (ref 0.2–1.0)
Monocytes Relative: 9 %
NEUTROS ABS: 3.6 10*3/uL (ref 1.4–6.5)
Neutrophils Relative %: 63 %
Platelets: 226 10*3/uL (ref 150–440)
RBC: 5.18 MIL/uL (ref 4.40–5.90)
RDW: 14 % (ref 11.5–14.5)
WBC: 5.8 10*3/uL (ref 3.8–10.6)

## 2017-06-08 LAB — COMPREHENSIVE METABOLIC PANEL
ALK PHOS: 70 U/L (ref 38–126)
ALT: 25 U/L (ref 17–63)
AST: 25 U/L (ref 15–41)
Albumin: 4.1 g/dL (ref 3.5–5.0)
Anion gap: 6 (ref 5–15)
BILIRUBIN TOTAL: 0.7 mg/dL (ref 0.3–1.2)
BUN: 16 mg/dL (ref 6–20)
CALCIUM: 8.8 mg/dL — AB (ref 8.9–10.3)
CO2: 27 mmol/L (ref 22–32)
CREATININE: 1.12 mg/dL (ref 0.61–1.24)
Chloride: 105 mmol/L (ref 101–111)
Glucose, Bld: 106 mg/dL — ABNORMAL HIGH (ref 65–99)
Potassium: 3.6 mmol/L (ref 3.5–5.1)
Sodium: 138 mmol/L (ref 135–145)
TOTAL PROTEIN: 6.9 g/dL (ref 6.5–8.1)

## 2017-06-08 LAB — LACTATE DEHYDROGENASE: LDH: 150 U/L (ref 98–192)

## 2017-06-08 NOTE — Assessment & Plan Note (Addendum)
#  small bowel mesenteric/ lymph node- s/p Excision follicular lymphoma- G-1.  Bone marrow biopsy negative.  Stage I follicle lymphoma; NO RT. PET scan July 10th December 2017-NED. Continue surveillance. I would recommend imaging of the CT scan on yearly basis.   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 No clinical evidence of recurrence.  # fatigue- recent TSH elevated at 7.9 [at PCP office]; repeat TSH- 4.8 [slightly elevated]; repeat was normal.   #  Follow up in 6 months/labs; will order CT scan at next visit.   # I reviewed the blood work- with the patient in detail; also reviewed the imaging independently [as summarized above]; and with the patient in detail.

## 2017-06-08 NOTE — Progress Notes (Signed)
[facility] OFFICE PROGRESS NOTE  Patient Care Team: Rusty Aus, MD as PCP - General (Internal Medicine) Hubbard Robinson, MD as Consulting Physician (Surgery)  No matching staging information was found for the patient.   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 Recm surviellance; Dr.Crystal.      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  male pleasant patient above history of Diffuse large B-cell lymphoma; noted to have a lymph node in the abdomen/jejunum PET avid- Status post Laparoscopic excision - positive for focal lymphoma grade 1/Stage I is here for follow-up/To review the results of the PET scan.   Patient's appetite is good. Denies any significant tingling and numbness of his extremities.  No weight loss. He otherwise denies any unusual abdominal pain nausea vomiting. Denies any constipation or diarrhea.No fevers or chills. No night sweats. No new lumps or bumps.  REVIEW OF SYSTEMS:  A complete 10 point review of system is done which is negative except mentioned above/history of present illness.   PAST MEDICAL HISTORY :  Past Medical History:  Diagnosis Date  . Cancer ([place])    lymphoma  . GERD (gastroesophageal reflux disease)   . Hypertension     PAST SURGICAL HISTORY :   Past Surgical History:   Procedure Laterality Date  . BOWEL RESECTION N/A 06/19/2016   Procedure: SMALL BOWEL RESECTION;  Surgeon: Hubbard Robinson, MD;  Location: [facility] ORS;  Service: General;  Laterality: N/A;  . CHOLECYSTECTOMY    . LAPAROSCOPIC REMOVAL OF MESENTERIC MASS N/A 06/19/2016   Procedure: LAPAROSCOPIC REMOVAL OF MESENTERIC MASS;  Surgeon: Hubbard Robinson, MD;  Location: [facility] ORS;  Service: General;  Laterality: N/A;  . LYMPH NODE DISSECTION    . PORT-A-CATH REMOVAL    . PORTACATH PLACEMENT      FAMILY HISTORY :   Family History  Problem Relation Age of Onset  . Clotting disorder Mother   . Lymphoma Father   . Hiatal hernia Father   . Heart disease Father        stent placed  . Cancer Paternal Uncle        esophagus  . Brain cancer Maternal Aunt     SOCIAL HISTORY:   Social History  Substance Use Topics  . Smoking status: Never Smoker  . Smokeless tobacco: Never Used  . Alcohol use No    ALLERGIES:  has No Known Allergies.  MEDICATIONS:  Current Outpatient Prescriptions  Medication Sig Dispense Refill  . cetirizine (ZYRTEC) 10 MG tablet Take 1 tablet by mouth daily.    . fluticasone (FLONASE) 50 MCG/ACT nasal spray Place 1 spray into both nostrils daily as needed for allergies or rhinitis.    Marland Kitchen losartan-hydrochlorothiazide (HYZAAR) 50-12.5 MG per tablet TAKE 1 TABLET DAILY.    Marland Kitchen  MELATONIN PO Take 2 mg by mouth as needed.    . Multiple Vitamin (MULTIVITAMIN WITH MINERALS) TABS tablet Take 1 tablet by mouth daily.    . pantoprazole (PROTONIX) 40 MG tablet Take 40 mg by mouth daily.     . polyethylene glycol (MIRALAX / GLYCOLAX) packet Take 17 g by mouth daily.    . ranitidine (ZANTAC) 150 MG tablet Take 150 mg by mouth at bedtime as needed for heartburn.     . loratadine (CLARITIN) 10 MG tablet Take 10 mg by mouth daily.    . meloxicam (MOBIC) 15 MG tablet Take 15 mg by mouth as needed. with food  1   No current facility-administered medications for this visit.     PHYSICAL  EXAMINATION: ECOG PERFORMANCE STATUS: 0 - Asymptomatic  BP 130/89 (BP Location: Left Arm, Patient Position: Sitting)   Pulse 80   Temp 97.6 F (36.4 C) (Tympanic)   Resp 20   Ht 6' 1"  (1.854 m)   Wt 210 lb 4.8 oz (95.4 kg)   BMI 27.75 kg/m   Filed Weights   06/08/17 1511  Weight: 210 lb 4.8 oz (95.4 kg)    GENERAL: Well-nourished well-developed; Alert, no distress and comfortable.  With his wife.  EYES: no pallor or icterus OROPHARYNX: no thrush or ulceration; good dentition  NECK: supple, no masses felt LYMPH:  no palpable lymphadenopathy in the cervical, axillary or inguinal regions LUNGS: clear to auscultation and  No wheeze or crackles HEART/CVS: regular rate & rhythm and no murmurs; No lower extremity edema ABDOMEN:abdomen soft, non-tender and normal bowel sounds; surgical incision well healing. Musculoskeletal:no cyanosis of digits and no clubbing  PSYCH: alert & oriented x 3 with fluent speech NEURO: no focal motor/sensory deficits SKIN:  no rashes or significant lesions  LABORATORY DATA:  I have reviewed the data as listed    Component Value Date/Time   NA 138 06/08/2017 1451   NA 140 03/12/2015 1007   K 3.6 06/08/2017 1451   K 3.7 03/12/2015 1007   CL 105 06/08/2017 1451   CL 105 03/12/2015 1007   CO2 27 06/08/2017 1451   CO2 29 03/12/2015 1007   GLUCOSE 106 (H) 06/08/2017 1451   GLUCOSE 143 (H) 03/12/2015 1007   BUN 16 06/08/2017 1451   BUN 15 03/12/2015 1007   CREATININE 1.12 06/08/2017 1451   CREATININE 0.92 03/12/2015 1007   CALCIUM 8.8 (L) 06/08/2017 1451   CALCIUM 8.6 (L) 03/12/2015 1007   PROT 6.9 06/08/2017 1451   PROT 6.6 03/12/2015 1007   ALBUMIN 4.1 06/08/2017 1451   ALBUMIN 4.0 03/12/2015 1007   AST 25 06/08/2017 1451   AST 26 03/12/2015 1007   ALT 25 06/08/2017 1451   ALT 37 03/12/2015 1007   ALKPHOS 70 06/08/2017 1451   ALKPHOS 79 03/12/2015 1007   BILITOT 0.7 06/08/2017 1451   BILITOT 0.8 03/12/2015 1007   GFRNONAA >60 06/08/2017  1451   GFRNONAA >60 03/12/2015 1007   GFRAA >60 06/08/2017 1451   GFRAA >60 03/12/2015 1007    No results found for: SPEP, UPEP  Lab Results  Component Value Date   WBC 5.8 06/08/2017   NEUTROABS 3.6 06/08/2017   HGB 15.2 06/08/2017   HCT 43.3 06/08/2017   MCV 83.7 06/08/2017   PLT 226 06/08/2017      Chemistry      Component Value Date/Time   NA 138 06/08/2017 1451   NA 140 03/12/2015 1007   K 3.6 06/08/2017 1451  K 3.7 03/12/2015 1007   CL 105 06/08/2017 1451   CL 105 03/12/2015 1007   CO2 27 06/08/2017 1451   CO2 29 03/12/2015 1007   BUN 16 06/08/2017 1451   BUN 15 03/12/2015 1007   CREATININE 1.12 06/08/2017 1451   CREATININE 0.92 03/12/2015 1007      Component Value Date/Time   CALCIUM 8.8 (L) 06/08/2017 1451   CALCIUM 8.6 (L) 03/12/2015 1007   ALKPHOS 70 06/08/2017 1451   ALKPHOS 79 03/12/2015 1007   AST 25 06/08/2017 1451   AST 26 03/12/2015 1007   ALT 25 06/08/2017 1451   ALT 37 03/12/2015 1007   BILITOT 0.7 06/08/2017 1451   BILITOT 0.8 03/12/2015 1007       RADIOGRAPHIC STUDIES: I have personally reviewed the radiological images as listed and agreed with the findings in the report. No results found.   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  Bone marrow biopsy negative.  Stage I follicle lymphoma; NO RT. PET scan July 10th December 2017-NED. Continue surveillance. I would recommend imaging of the CT scan on yearly basis.   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 No clinical evidence of recurrence.  # fatigue- recent TSH elevated at 7.9 [at PCP office]; repeat TSH- 4.8 [slightly elevated]; repeat was normal.   #  Follow up in 6 months/labs; will order CT scan at next visit.   # I reviewed the blood work- with the patient in detail; also reviewed the imaging independently [as  summarized above]; and with the patient in detail.    Orders Placed This Encounter  Procedures  . CBC with Differential    Standing Status:   Future    Standing Expiration Date:   06/08/2018  . Comprehensive metabolic panel    Standing Status:   Future    Standing Expiration Date:   06/08/2018  . Lactate dehydrogenase    Standing Status:   Future    Standing Expiration Date:   06/08/2018   All questions were answered. The patient knows to call the clinic with any problems, questions or concerns.      Cammie Sickle, MD 06/09/2017 8:20 PM

## 2017-06-09 NOTE — Telephone Encounter (Signed)
x

## 2017-12-09 ENCOUNTER — Inpatient Hospital Stay (HOSPITAL_BASED_OUTPATIENT_CLINIC_OR_DEPARTMENT_OTHER): Payer: 59 | Admitting: Internal Medicine

## 2017-12-09 ENCOUNTER — Inpatient Hospital Stay: Payer: 59 | Attending: Internal Medicine

## 2017-12-09 VITALS — BP 136/93 | HR 73 | Temp 98.6°F | Resp 16 | Wt 211.2 lb

## 2017-12-09 DIAGNOSIS — Z923 Personal history of irradiation: Secondary | ICD-10-CM

## 2017-12-09 DIAGNOSIS — Z79899 Other long term (current) drug therapy: Secondary | ICD-10-CM | POA: Diagnosis not present

## 2017-12-09 DIAGNOSIS — C8203 Follicular lymphoma grade I, intra-abdominal lymph nodes: Secondary | ICD-10-CM

## 2017-12-09 DIAGNOSIS — Z8572 Personal history of non-Hodgkin lymphomas: Secondary | ICD-10-CM | POA: Diagnosis not present

## 2017-12-09 DIAGNOSIS — I10 Essential (primary) hypertension: Secondary | ICD-10-CM | POA: Insufficient documentation

## 2017-12-09 DIAGNOSIS — Z9221 Personal history of antineoplastic chemotherapy: Secondary | ICD-10-CM | POA: Insufficient documentation

## 2017-12-09 DIAGNOSIS — K219 Gastro-esophageal reflux disease without esophagitis: Secondary | ICD-10-CM | POA: Diagnosis not present

## 2017-12-09 LAB — COMPREHENSIVE METABOLIC PANEL
ALK PHOS: 71 U/L (ref 38–126)
ALT: 27 U/L (ref 17–63)
AST: 27 U/L (ref 15–41)
Albumin: 4.1 g/dL (ref 3.5–5.0)
Anion gap: 6 (ref 5–15)
BILIRUBIN TOTAL: 0.8 mg/dL (ref 0.3–1.2)
BUN: 15 mg/dL (ref 6–20)
CALCIUM: 9 mg/dL (ref 8.9–10.3)
CO2: 27 mmol/L (ref 22–32)
CREATININE: 0.96 mg/dL (ref 0.61–1.24)
Chloride: 105 mmol/L (ref 101–111)
Glucose, Bld: 140 mg/dL — ABNORMAL HIGH (ref 65–99)
Potassium: 3.7 mmol/L (ref 3.5–5.1)
Sodium: 138 mmol/L (ref 135–145)
TOTAL PROTEIN: 7.1 g/dL (ref 6.5–8.1)

## 2017-12-09 LAB — CBC WITH DIFFERENTIAL/PLATELET
BASOS PCT: 1 %
Basophils Absolute: 0 10*3/uL (ref 0–0.1)
EOS ABS: 0.3 10*3/uL (ref 0–0.7)
Eosinophils Relative: 5 %
HCT: 47.8 % (ref 40.0–52.0)
Hemoglobin: 16.2 g/dL (ref 13.0–18.0)
Lymphocytes Relative: 20 %
Lymphs Abs: 1.1 10*3/uL (ref 1.0–3.6)
MCH: 29.1 pg (ref 26.0–34.0)
MCHC: 33.9 g/dL (ref 32.0–36.0)
MCV: 85.8 fL (ref 80.0–100.0)
MONOS PCT: 9 %
Monocytes Absolute: 0.5 10*3/uL (ref 0.2–1.0)
NEUTROS PCT: 65 %
Neutro Abs: 3.5 10*3/uL (ref 1.4–6.5)
Platelets: 223 10*3/uL (ref 150–440)
RBC: 5.57 MIL/uL (ref 4.40–5.90)
RDW: 13.9 % (ref 11.5–14.5)
WBC: 5.3 10*3/uL (ref 3.8–10.6)

## 2017-12-09 LAB — LACTATE DEHYDROGENASE: LDH: 152 U/L (ref 98–192)

## 2017-12-09 NOTE — Progress Notes (Signed)
[place] OFFICE PROGRESS NOTE  Patient Care Team: Rusty Aus, MD as PCP - General (Internal Medicine) Hubbard Robinson, MD as Consulting Physician (Surgery)  No matching staging information was found for the patient.   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 Recm surviellance; Dr.Crystal.      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  male pleasant patient above history of Diffuse large B-cell lymphoma; history of follicle lymphoma status post excision is here for follow-up.  Patient denies any abdominal pain.  Denies any chest pain or shortness of breath or cough.  Denies any weight loss.  Denies any new lumps or bumps.  His energy levels are adequate.  REVIEW OF SYSTEMS:  A complete 10 point review of system is done which is negative except mentioned above/history of present illness.   PAST MEDICAL HISTORY :  Past Medical History:  Diagnosis Date  . Cancer ([place])    lymphoma  . GERD (gastroesophageal reflux disease)   . Hypertension     PAST SURGICAL HISTORY :   Past Surgical History:  Procedure Laterality Date  . BOWEL RESECTION N/A 06/19/2016   Procedure: SMALL BOWEL RESECTION;  Surgeon: Hubbard Robinson, MD;  Location: [facility] ORS;  Service: General;  Laterality: N/A;  . CHOLECYSTECTOMY    .  LAPAROSCOPIC REMOVAL OF MESENTERIC MASS N/A 06/19/2016   Procedure: LAPAROSCOPIC REMOVAL OF MESENTERIC MASS;  Surgeon: Hubbard Robinson, MD;  Location: [facility] ORS;  Service: General;  Laterality: N/A;  . LYMPH NODE DISSECTION    . PORT-A-CATH REMOVAL    . PORTACATH PLACEMENT      FAMILY HISTORY :   Family History  Problem Relation Age of Onset  . Clotting disorder Mother   . Lymphoma Father   . Hiatal hernia Father   . Heart disease Father        stent placed  . Cancer Paternal Uncle        esophagus  . Brain cancer Maternal Aunt     SOCIAL HISTORY:   Social History   Tobacco Use  . Smoking status: Never Smoker  . Smokeless tobacco: Never Used  Substance Use Topics  . Alcohol use: No  . Drug use: No    ALLERGIES:  has No Known Allergies.  MEDICATIONS:  Current Outpatient Medications  Medication Sig Dispense Refill  . cetirizine (ZYRTEC) 10 MG tablet Take 1 tablet by mouth daily.    . fluticasone (FLONASE) 50 MCG/ACT nasal spray Place 1 spray into both nostrils daily as needed for allergies or rhinitis.    Marland Kitchen loratadine (CLARITIN) 10 MG tablet Take 10 mg by mouth daily.    Marland Kitchen losartan-hydrochlorothiazide (HYZAAR) 50-12.5 MG per tablet TAKE 1 TABLET DAILY.    . Multiple Vitamin (MULTIVITAMIN  WITH MINERALS) TABS tablet Take 1 tablet by mouth daily.    . pantoprazole (PROTONIX) 40 MG tablet Take 40 mg by mouth daily.     . polyethylene glycol (MIRALAX / GLYCOLAX) packet Take 17 g by mouth daily.    . ranitidine (ZANTAC) 150 MG tablet Take 150 mg by mouth at bedtime as needed for heartburn.     Marland Kitchen MELATONIN PO Take 2 mg by mouth at bedtime as needed.      No current facility-administered medications for this visit.     PHYSICAL EXAMINATION: ECOG PERFORMANCE STATUS: 0 - Asymptomatic  BP (!) 136/93 (BP Location: Left Arm, Patient Position: Sitting)   Pulse 73   Temp 98.6 F (37 C) (Tympanic)   Resp 16   Wt 211 lb 3.2 oz (95.8 kg)   BMI 27.86 kg/m   Filed Weights    12/09/17 1513  Weight: 211 lb 3.2 oz (95.8 kg)    GENERAL: Well-nourished well-developed; Alert, no distress and comfortable.  With his wife.  EYES: no pallor or icterus OROPHARYNX: no thrush or ulceration; good dentition  NECK: supple, no masses felt LYMPH:  no palpable lymphadenopathy in the cervical, axillary or inguinal regions LUNGS: clear to auscultation and  No wheeze or crackles HEART/CVS: regular rate & rhythm and no murmurs; No lower extremity edema ABDOMEN:abdomen soft, non-tender and normal bowel sounds; surgical incision well healing. Musculoskeletal:no cyanosis of digits and no clubbing  PSYCH: alert & oriented x 3 with fluent speech NEURO: no focal motor/sensory deficits SKIN:  no rashes or significant lesions  LABORATORY DATA:  I have reviewed the data as listed    Component Value Date/Time   NA 138 12/09/2017 1429   NA 140 03/12/2015 1007   K 3.7 12/09/2017 1429   K 3.7 03/12/2015 1007   CL 105 12/09/2017 1429   CL 105 03/12/2015 1007   CO2 27 12/09/2017 1429   CO2 29 03/12/2015 1007   GLUCOSE 140 (H) 12/09/2017 1429   GLUCOSE 143 (H) 03/12/2015 1007   BUN 15 12/09/2017 1429   BUN 15 03/12/2015 1007   CREATININE 0.96 12/09/2017 1429   CREATININE 0.92 03/12/2015 1007   CALCIUM 9.0 12/09/2017 1429   CALCIUM 8.6 (L) 03/12/2015 1007   PROT 7.1 12/09/2017 1429   PROT 6.6 03/12/2015 1007   ALBUMIN 4.1 12/09/2017 1429   ALBUMIN 4.0 03/12/2015 1007   AST 27 12/09/2017 1429   AST 26 03/12/2015 1007   ALT 27 12/09/2017 1429   ALT 37 03/12/2015 1007   ALKPHOS 71 12/09/2017 1429   ALKPHOS 79 03/12/2015 1007   BILITOT 0.8 12/09/2017 1429   BILITOT 0.8 03/12/2015 1007   GFRNONAA >60 12/09/2017 1429   GFRNONAA >60 03/12/2015 1007   GFRAA >60 12/09/2017 1429   GFRAA >60 03/12/2015 1007    No results found for: SPEP, UPEP  Lab Results  Component Value Date   WBC 5.3 12/09/2017   NEUTROABS 3.5 12/09/2017   HGB 16.2 12/09/2017   HCT 47.8 12/09/2017   MCV  85.8 12/09/2017   PLT 223 12/09/2017      Chemistry      Component Value Date/Time   NA 138 12/09/2017 1429   NA 140 03/12/2015 1007   K 3.7 12/09/2017 1429   K 3.7 03/12/2015 1007   CL 105 12/09/2017 1429   CL 105 03/12/2015 1007   CO2 27 12/09/2017 1429   CO2 29 03/12/2015 1007   BUN 15 12/09/2017 1429   BUN 15 03/12/2015  1007   CREATININE 0.96 12/09/2017 1429   CREATININE 0.92 03/12/2015 1007      Component Value Date/Time   CALCIUM 9.0 12/09/2017 1429   CALCIUM 8.6 (L) 03/12/2015 1007   ALKPHOS 71 12/09/2017 1429   ALKPHOS 79 03/12/2015 1007   AST 27 12/09/2017 1429   AST 26 03/12/2015 1007   ALT 27 12/09/2017 1429   ALT 37 03/12/2015 1007   BILITOT 0.8 12/09/2017 1429   BILITOT 0.8 03/12/2015 1007       RADIOGRAPHIC STUDIES: I have personally reviewed the radiological images as listed and agreed with the findings in the report. No results found.   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  Bone marrow biopsy negative.  Stage I follicular lymphoma; NO RT. last scan July 2018 NED.  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 No clinical evidence of recurrence.  #  Follow up in 6 months/labs;CT prior.    Orders Placed This Encounter  Procedures  . CT Abdomen Pelvis W Contrast    Standing Status:   Future    Standing Expiration Date:   12/09/2018    Scheduling Instructions:     1-2 days prior to MD visit in 6 months-Dr.B    Order Specific Question:   If indicated for the ordered procedure, I authorize the administration of contrast media per Radiology protocol    Answer:   Yes    Order Specific Question:   Preferred imaging location?    Answer:    [facility]    Order Specific Question:   Radiology Contrast Protocol - do NOT remove file path    Answer:   [email or web address]    All questions were answered. The patient knows to call the clinic with any problems, questions or concerns.      Cammie Sickle, MD 12/22/2017 8:08 PM

## 2017-12-09 NOTE — Assessment & Plan Note (Addendum)
#  small bowel mesenteric/ lymph node- s/p Excision follicular lymphoma- G-1.  Bone marrow biopsy negative.  Stage I follicular lymphoma; NO RT. last scan July 2018 NED.  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 No clinical evidence of recurrence.  #  Follow up in 6 months/labs;CT prior.

## 2018-01-14 ENCOUNTER — Telehealth: Payer: Self-pay | Admitting: *Deleted

## 2018-01-14 NOTE — Telephone Encounter (Signed)
Dr. Jacinto Reap does not have any preference on the dermatology. Just let us know who pt prefers and I can enter the referral or his pcp could refer.

## 2018-01-14 NOTE — Telephone Encounter (Signed)
She said she will call Dr Nehemiah Massed at [facility]

## 2018-01-14 NOTE — Telephone Encounter (Signed)
Troy Reynolds called reporting that patient has a mole on his back and wants suggestion form Dr B as to who he should have look at it. Please advise

## 2018-05-03 ENCOUNTER — Other Ambulatory Visit: Payer: Self-pay | Admitting: Surgery

## 2018-05-03 DIAGNOSIS — M778 Other enthesopathies, not elsewhere classified: Secondary | ICD-10-CM

## 2018-05-03 DIAGNOSIS — M7582 Other shoulder lesions, left shoulder: Principal | ICD-10-CM

## 2018-05-14 ENCOUNTER — Ambulatory Visit
Admission: RE | Admit: 2018-05-14 | Discharge: 2018-05-14 | Disposition: A | Discharge status: Home / Self Care | Payer: Medicare HMO | Source: Ambulatory Visit | Attending: Surgery | Admitting: Surgery

## 2018-05-14 DIAGNOSIS — M75102 Unspecified rotator cuff tear or rupture of left shoulder, not specified as traumatic: Secondary | ICD-10-CM | POA: Diagnosis present

## 2018-05-14 DIAGNOSIS — M778 Other enthesopathies, not elsewhere classified: Secondary | ICD-10-CM

## 2018-05-14 DIAGNOSIS — M19012 Primary osteoarthritis, left shoulder: Secondary | ICD-10-CM | POA: Diagnosis not present

## 2018-05-14 DIAGNOSIS — M7582 Other shoulder lesions, left shoulder: Secondary | ICD-10-CM

## 2018-06-08 ENCOUNTER — Ambulatory Visit
Admission: RE | Admit: 2018-06-08 | Discharge: 2018-06-08 | Disposition: A | Discharge status: Home / Self Care | Payer: Medicare HMO | Source: Ambulatory Visit | Attending: Internal Medicine | Admitting: Internal Medicine

## 2018-06-08 DIAGNOSIS — C8203 Follicular lymphoma grade I, intra-abdominal lymph nodes: Secondary | ICD-10-CM | POA: Diagnosis present

## 2018-06-08 DIAGNOSIS — R590 Localized enlarged lymph nodes: Secondary | ICD-10-CM | POA: Insufficient documentation

## 2018-06-08 HISTORY — DX: Follicular lymphoma, unspecified, unspecified site: C82.90

## 2018-06-08 LAB — POCT I-STAT CREATININE: Creatinine, Ser: 0.9 mg/dL (ref 0.61–1.24)

## 2018-06-08 MED ORDER — IOPAMIDOL (ISOVUE-300) INJECTION 61%
100.0000 mL | Freq: Once | INTRAVENOUS | Status: AC | PRN
  Administered 2018-06-08: 100 mL via INTRAVENOUS

## 2018-06-09 ENCOUNTER — Other Ambulatory Visit: Payer: Self-pay | Admitting: *Deleted

## 2018-06-09 DIAGNOSIS — C8203 Follicular lymphoma grade I, intra-abdominal lymph nodes: Secondary | ICD-10-CM

## 2018-06-10 ENCOUNTER — Inpatient Hospital Stay (HOSPITAL_BASED_OUTPATIENT_CLINIC_OR_DEPARTMENT_OTHER): Payer: Medicare HMO | Admitting: Internal Medicine

## 2018-06-10 ENCOUNTER — Inpatient Hospital Stay: Payer: Medicare HMO | Attending: Internal Medicine

## 2018-06-10 ENCOUNTER — Other Ambulatory Visit: Payer: Self-pay

## 2018-06-10 VITALS — BP 129/83 | HR 79 | Temp 98.3°F | Resp 20 | Ht 73.0 in | Wt 212.0 lb

## 2018-06-10 DIAGNOSIS — Z79899 Other long term (current) drug therapy: Secondary | ICD-10-CM | POA: Insufficient documentation

## 2018-06-10 DIAGNOSIS — Z8572 Personal history of non-Hodgkin lymphomas: Secondary | ICD-10-CM

## 2018-06-10 DIAGNOSIS — Z9221 Personal history of antineoplastic chemotherapy: Secondary | ICD-10-CM | POA: Insufficient documentation

## 2018-06-10 DIAGNOSIS — K219 Gastro-esophageal reflux disease without esophagitis: Secondary | ICD-10-CM | POA: Insufficient documentation

## 2018-06-10 DIAGNOSIS — Z923 Personal history of irradiation: Secondary | ICD-10-CM | POA: Diagnosis not present

## 2018-06-10 DIAGNOSIS — C8203 Follicular lymphoma grade I, intra-abdominal lymph nodes: Secondary | ICD-10-CM

## 2018-06-10 DIAGNOSIS — I10 Essential (primary) hypertension: Secondary | ICD-10-CM | POA: Diagnosis not present

## 2018-06-10 LAB — LACTATE DEHYDROGENASE: LDH: 150 U/L (ref 98–192)

## 2018-06-10 LAB — COMPREHENSIVE METABOLIC PANEL
ALK PHOS: 69 U/L (ref 38–126)
ALT: 22 U/L (ref 0–44)
AST: 24 U/L (ref 15–41)
Albumin: 4.1 g/dL (ref 3.5–5.0)
Anion gap: 9 (ref 5–15)
BUN: 17 mg/dL (ref 8–23)
CALCIUM: 8.8 mg/dL — AB (ref 8.9–10.3)
CHLORIDE: 106 mmol/L (ref 98–111)
CO2: 23 mmol/L (ref 22–32)
CREATININE: 0.86 mg/dL (ref 0.61–1.24)
GFR calc Af Amer: >60 mL/min (ref >60)
GFR calc non Af Amer: >60 mL/min (ref >60)
Glucose, Bld: 136 mg/dL — ABNORMAL HIGH (ref 70–99)
Potassium: 3.9 mmol/L (ref 3.5–5.1)
Sodium: 138 mmol/L (ref 135–145)
Total Bilirubin: 0.7 mg/dL (ref 0.3–1.2)
Total Protein: 6.8 g/dL (ref 6.5–8.1)

## 2018-06-10 LAB — CBC WITH DIFFERENTIAL/PLATELET
BASOS PCT: 1 %
Basophils Absolute: 0.1 10*3/uL (ref 0–0.1)
EOS ABS: 0.1 10*3/uL (ref 0–0.7)
Eosinophils Relative: 3 %
HEMATOCRIT: 45 % (ref 40.0–52.0)
Hemoglobin: 15.6 g/dL (ref 13.0–18.0)
LYMPHS ABS: 1 10*3/uL (ref 1.0–3.6)
Lymphocytes Relative: 17 %
MCH: 29.7 pg (ref 26.0–34.0)
MCHC: 34.7 g/dL (ref 32.0–36.0)
MCV: 85.6 fL (ref 80.0–100.0)
MONO ABS: 0.5 10*3/uL (ref 0.2–1.0)
MONOS PCT: 8 %
Neutro Abs: 4.1 10*3/uL (ref 1.4–6.5)
Neutrophils Relative %: 71 %
Platelets: 206 10*3/uL (ref 150–440)
RBC: 5.26 MIL/uL (ref 4.40–5.90)
RDW: 14.2 % (ref 11.5–14.5)
WBC: 5.8 10*3/uL (ref 3.8–10.6)

## 2018-06-10 NOTE — Assessment & Plan Note (Addendum)
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  On surveillance; CT scan July 2019-up to 8 mm lymph node stable; no new lymphadenopathy.   #Abdominal bloating chronic nausea; unclear etiology-recommend follow-up with PCP/GI regarding work-up with possible EGD.  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  Clinically no evidence of recurrence stable.  #  Follow up in 6 months/labs;no scan.    # I reviewed the blood work- with the patient in detail; also reviewed the imaging independently [as summarized above]; and with the patient in detail.    # 25 minutes face-to-face with the patient discussing the above plan of care; more than 50% of time spent on prognosis/ natural history; counseling and coordination.

## 2018-06-10 NOTE — Progress Notes (Signed)
[place] OFFICE PROGRESS NOTE  Patient Care Team: Rusty Aus, MD as PCP - General (Internal Medicine) Hubbard Robinson, MD as Consulting Physician (Surgery)  Cancer Staging No matching staging information was found for the patient.   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 Recm surviellance; Dr.Crystal. 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  Patient continues to complain of mild to moderate fatigue.  Complains of abdominal discomfort/bloating.  Denies any constipation or diarrhea.  Mild to moderate nausea.  No blood in stools black or stools.  . Review of Systems  Constitutional: Positive for malaise/fatigue. Negative for chills, diaphoresis, fever and weight loss.  HENT: Negative for nosebleeds and sore throat.   Eyes: Negative for double vision.  Respiratory: Negative for cough, hemoptysis, sputum production, shortness of breath and wheezing.   Cardiovascular: Negative for chest pain, palpitations, orthopnea and leg swelling.  Gastrointestinal: Positive for  nausea. Negative for abdominal pain (Abdominal bloating/discomfort no pain), blood in stool, constipation, diarrhea, heartburn, melena and vomiting.  Genitourinary: Negative for dysuria, frequency and urgency.  Musculoskeletal: Negative for back pain and joint pain.  Skin: Negative.  Negative for itching and rash.  Neurological: Negative for dizziness, tingling, focal weakness, weakness and headaches.  Endo/Heme/Allergies: Does not bruise/bleed easily.  Psychiatric/Behavioral: Negative for depression. The patient is not nervous/anxious and does not have insomnia.       PAST MEDICAL HISTORY :  Past Medical History:  Diagnosis Date  . Follicular lymphoma ([place]) 2014   Chemo tx's.  Marland Kitchen GERD (gastroesophageal reflux disease)   . Hypertension     PAST SURGICAL HISTORY :   Past Surgical History:  Procedure Laterality Date  . BOWEL RESECTION N/A 06/19/2016   Procedure: SMALL BOWEL RESECTION;  Surgeon: Hubbard Robinson, MD;  Location: [facility] ORS;  Service: General;  Laterality: N/A;  . CHOLECYSTECTOMY    . LAPAROSCOPIC REMOVAL OF MESENTERIC MASS N/A 06/19/2016   Procedure: LAPAROSCOPIC REMOVAL OF MESENTERIC MASS;  Surgeon: Hubbard Robinson, MD;  Location: [facility] ORS;  Service: General;  Laterality: N/A;  . LYMPH NODE DISSECTION    . PORT-A-CATH REMOVAL    . PORTACATH PLACEMENT      FAMILY HISTORY :   Family History  Problem Relation Age of Onset  . Clotting disorder Mother   . Lymphoma Father   . Hiatal hernia Father   . Heart disease Father        stent placed  . Cancer Paternal Uncle        esophagus  . Brain cancer Maternal  Aunt     SOCIAL HISTORY:   Social History   Tobacco Use  . Smoking status: Never Smoker  . Smokeless tobacco: Never Used  Substance Use Topics  . Alcohol use: No  . Drug use: No    ALLERGIES:  has No Known Allergies.  MEDICATIONS:  Current Outpatient Medications  Medication Sig Dispense Refill  . cetirizine (ZYRTEC) 10 MG tablet Take 1 tablet by  mouth daily.    . fluticasone (FLONASE) 50 MCG/ACT nasal spray Place 1 spray into both nostrils daily as needed for allergies or rhinitis.    Marland Kitchen losartan-hydrochlorothiazide (HYZAAR) 50-12.5 MG per tablet TAKE 1 TABLET DAILY.    . Multiple Vitamin (MULTIVITAMIN WITH MINERALS) TABS tablet Take 1 tablet by mouth daily.    . pantoprazole (PROTONIX) 40 MG tablet Take 40 mg by mouth daily.     . Probiotic Product (PHILLIPS COLON HEALTH PO) Take 1 capsule by mouth daily.    . ranitidine (ZANTAC) 150 MG tablet Take 150 mg by mouth at bedtime as needed for heartburn.     Marland Kitchen MELATONIN PO Take 2 mg by mouth at bedtime as needed.     . polyethylene glycol (MIRALAX / GLYCOLAX) packet Take 17 g by mouth daily.     No current facility-administered medications for this visit.     PHYSICAL EXAMINATION: ECOG PERFORMANCE STATUS: 1 - Symptomatic but completely ambulatory  BP 129/83   Pulse 79   Temp 98.3 F (36.8 C) (Tympanic)   Resp 20   Ht 6\' 1"  (1.854 m)   Wt 212 lb (96.2 kg)   BMI 27.97 kg/m   Filed Weights   06/10/18 1126  Weight: 212 lb (96.2 kg)    GENERAL: Well-nourished well-developed; Alert, no distress and comfortable.  Accompanied by his wife. EYES: no pallor or icterus OROPHARYNX: no thrush or ulceration; NECK: supple; no lymph nodes felt. LYMPH:  no palpable lymphadenopathy in the axillary or inguinal regions LUNGS: Decreased breath sounds auscultation bilaterally. No wheeze or crackles HEART/CVS: regular rate & rhythm and no murmurs; No lower extremity edema ABDOMEN:abdomen soft, non-tender and normal bowel sounds. No hepatomegaly or splenomegaly.  Musculoskeletal:no cyanosis of digits and no clubbing  PSYCH: alert & oriented x 3 with fluent speech NEURO: no focal motor/sensory deficits SKIN:  no rashes or significant lesions    LABORATORY DATA:  I have reviewed the data as listed    Component Value Date/Time   NA 138 06/10/2018 1046   NA 140 03/12/2015 1007   K 3.9  06/10/2018 1046   K 3.7 03/12/2015 1007   CL 106 06/10/2018 1046   CL 105 03/12/2015 1007   CO2 23 06/10/2018 1046   CO2 29 03/12/2015 1007   GLUCOSE 136 (H) 06/10/2018 1046   GLUCOSE 143 (H) 03/12/2015 1007   BUN 17 06/10/2018 1046   BUN 15 03/12/2015 1007   CREATININE 0.86 06/10/2018 1046   CREATININE 0.92 03/12/2015 1007   CALCIUM 8.8 (L) 06/10/2018 1046   CALCIUM 8.6 (L) 03/12/2015 1007   PROT 6.8 06/10/2018 1046   PROT 6.6 03/12/2015 1007   ALBUMIN 4.1 06/10/2018 1046   ALBUMIN 4.0 03/12/2015 1007   AST 24 06/10/2018 1046   AST 26 03/12/2015 1007   ALT 22 06/10/2018 1046   ALT 37 03/12/2015 1007   ALKPHOS 69 06/10/2018 1046   ALKPHOS 79 03/12/2015 1007   BILITOT 0.7 06/10/2018 1046   BILITOT 0.8 03/12/2015 1007   GFRNONAA >60 06/10/2018 1046   GFRNONAA >60  03/12/2015 1007   GFRAA >60 06/10/2018 1046   GFRAA >60 03/12/2015 1007    No results found for: SPEP, UPEP  Lab Results  Component Value Date   WBC 5.8 06/10/2018   NEUTROABS 4.1 06/10/2018   HGB 15.6 06/10/2018   HCT 45.0 06/10/2018   MCV 85.6 06/10/2018   PLT 206 06/10/2018      Chemistry      Component Value Date/Time   NA 138 06/10/2018 1046   NA 140 03/12/2015 1007   K 3.9 06/10/2018 1046   K 3.7 03/12/2015 1007   CL 106 06/10/2018 1046   CL 105 03/12/2015 1007   CO2 23 06/10/2018 1046   CO2 29 03/12/2015 1007   BUN 17 06/10/2018 1046   BUN 15 03/12/2015 1007   CREATININE 0.86 06/10/2018 1046   CREATININE 0.92 03/12/2015 1007      Component Value Date/Time   CALCIUM 8.8 (L) 06/10/2018 1046   CALCIUM 8.6 (L) 03/12/2015 1007   ALKPHOS 69 06/10/2018 1046   ALKPHOS 79 03/12/2015 1007   AST 24 06/10/2018 1046   AST 26 03/12/2015 1007   ALT 22 06/10/2018 1046   ALT 37 03/12/2015 1007   BILITOT 0.7 06/10/2018 1046   BILITOT 0.8 03/12/2015 1007       RADIOGRAPHIC STUDIES: I have personally reviewed the radiological images as listed and agreed with the findings in the report. No results  found.   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  On surveillance; CT scan July 2019-up to 8 mm lymph node stable; no new lymphadenopathy.   #Abdominal bloating chronic nausea; unclear etiology-recommend follow-up with PCP/GI regarding work-up with possible EGD.  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  Clinically no evidence of recurrence stable.  #  Follow up in 6 months/labs;no scan.    # I reviewed the blood work- with the patient in detail; also reviewed the imaging independently [as summarized above]; and with the patient in detail.    # 25 minutes face-to-face with the patient discussing the above plan of care; more than 50% of time spent on prognosis/ natural history; counseling and coordination.    Orders Placed This Encounter  Procedures  . Comprehensive metabolic panel    Standing Status:   Future    Standing Expiration Date:   06/11/2019  . CBC with Differential    Standing Status:   Future    Standing Expiration Date:   06/11/2019  . Lactate dehydrogenase    Standing Status:   Future    Standing Expiration Date:   06/11/2019   All questions were answered. The patient knows to call the clinic with any problems, questions or concerns.      Cammie Sickle, MD 06/14/2018 4:57 PM

## 2018-08-20 ENCOUNTER — Other Ambulatory Visit: Payer: Self-pay | Admitting: Ophthalmology

## 2018-08-20 DIAGNOSIS — G453 Amaurosis fugax: Secondary | ICD-10-CM

## 2018-08-31 ENCOUNTER — Ambulatory Visit
Admission: RE | Admit: 2018-08-31 | Discharge: 2018-08-31 | Disposition: A | Discharge status: Home / Self Care | Payer: Medicare HMO | Source: Ambulatory Visit | Attending: Ophthalmology | Admitting: Ophthalmology

## 2018-08-31 DIAGNOSIS — G453 Amaurosis fugax: Secondary | ICD-10-CM

## 2018-09-23 ENCOUNTER — Other Ambulatory Visit: Payer: Self-pay

## 2018-09-23 ENCOUNTER — Emergency Department: Payer: Medicare HMO

## 2018-09-23 ENCOUNTER — Encounter: Payer: Self-pay | Admitting: Emergency Medicine

## 2018-09-23 DIAGNOSIS — Z8673 Personal history of transient ischemic attack (TIA), and cerebral infarction without residual deficits: Secondary | ICD-10-CM | POA: Diagnosis not present

## 2018-09-23 DIAGNOSIS — Y9389 Activity, other specified: Secondary | ICD-10-CM | POA: Diagnosis not present

## 2018-09-23 DIAGNOSIS — R55 Syncope and collapse: Secondary | ICD-10-CM | POA: Diagnosis present

## 2018-09-23 DIAGNOSIS — Z79899 Other long term (current) drug therapy: Secondary | ICD-10-CM | POA: Insufficient documentation

## 2018-09-23 DIAGNOSIS — Y998 Other external cause status: Secondary | ICD-10-CM | POA: Insufficient documentation

## 2018-09-23 DIAGNOSIS — I10 Essential (primary) hypertension: Secondary | ICD-10-CM | POA: Diagnosis not present

## 2018-09-23 DIAGNOSIS — W01198A Fall on same level from slipping, tripping and stumbling with subsequent striking against other object, initial encounter: Secondary | ICD-10-CM | POA: Diagnosis not present

## 2018-09-23 DIAGNOSIS — Y929 Unspecified place or not applicable: Secondary | ICD-10-CM | POA: Insufficient documentation

## 2018-09-23 DIAGNOSIS — S0990XA Unspecified injury of head, initial encounter: Secondary | ICD-10-CM | POA: Insufficient documentation

## 2018-09-23 LAB — BASIC METABOLIC PANEL
Anion gap: 6 (ref 5–15)
BUN: 18 mg/dL (ref 8–23)
CHLORIDE: 107 mmol/L (ref 98–111)
CO2: 29 mmol/L (ref 22–32)
CREATININE: 1.08 mg/dL (ref 0.61–1.24)
Calcium: 9 mg/dL (ref 8.9–10.3)
GFR calc non Af Amer: >60 mL/min (ref >60)
Glucose, Bld: 94 mg/dL (ref 70–99)
POTASSIUM: 4.1 mmol/L (ref 3.5–5.1)
SODIUM: 142 mmol/L (ref 135–145)

## 2018-09-23 LAB — CBC
HEMATOCRIT: 45.7 % (ref 39.0–52.0)
HEMOGLOBIN: 14.9 g/dL (ref 13.0–17.0)
MCH: 28.7 pg (ref 26.0–34.0)
MCHC: 32.6 g/dL (ref 30.0–36.0)
MCV: 88.1 fL (ref 80.0–100.0)
NRBC: 0 % (ref 0.0–0.2)
Platelets: 226 10*3/uL (ref 150–400)
RBC: 5.19 MIL/uL (ref 4.22–5.81)
RDW: 13.2 % (ref 11.5–15.5)
WBC: 5.8 10*3/uL (ref 4.0–10.5)

## 2018-09-23 LAB — URINALYSIS, COMPLETE (UACMP) WITH MICROSCOPIC
BACTERIA UA: NONE SEEN
BILIRUBIN URINE: NEGATIVE
Glucose, UA: NEGATIVE mg/dL
Hgb urine dipstick: NEGATIVE
KETONES UR: NEGATIVE mg/dL
LEUKOCYTES UA: NEGATIVE
NITRITE: NEGATIVE
Protein, ur: NEGATIVE mg/dL
SPECIFIC GRAVITY, URINE: 1.023 (ref 1.005–1.030)
pH: 5 (ref 5.0–8.0)

## 2018-09-23 LAB — TROPONIN I

## 2018-09-23 NOTE — ED Triage Notes (Addendum)
Patient ambulatory to triage with steady gait, without difficulty or distress noted; pt reports falling PTA with no known cause; hit head on door frame and st could not remember events prior to fall; c/o pain to right side head; denies any other c/o or injuries

## 2018-09-24 ENCOUNTER — Emergency Department
Admission: EM | Admit: 2018-09-24 | Discharge: 2018-09-24 | Disposition: A | Discharge status: Home / Self Care | Payer: Medicare HMO | Attending: Emergency Medicine | Admitting: Emergency Medicine

## 2018-09-24 ENCOUNTER — Other Ambulatory Visit: Payer: Self-pay

## 2018-09-24 ENCOUNTER — Emergency Department: Payer: Medicare HMO

## 2018-09-24 DIAGNOSIS — R55 Syncope and collapse: Secondary | ICD-10-CM

## 2018-09-24 LAB — TROPONIN I: Troponin I: <0.03 ng/mL (ref <0.03)

## 2018-09-24 MED ORDER — GADOBUTROL 1 MMOL/ML IV SOLN
9.5000 mL | Freq: Once | INTRAVENOUS | Status: AC | PRN
  Administered 2018-09-24: 9.5 mL via INTRAVENOUS

## 2018-09-24 NOTE — ED Notes (Signed)
Patient transported to MRI 

## 2018-09-24 NOTE — ED Notes (Signed)
Pt returned to room at this time

## 2018-09-24 NOTE — ED Provider Notes (Signed)
[facility] Emergency Department Provider Note    First MD Initiated Contact with Patient 09/24/18 [phone]     (approximate)  I have reviewed the triage vital signs and the nursing notes.   HISTORY  Chief Complaint Loss of Consciousness    HPI Troy Reynolds is a 67 y.o. male with below list of chronic medical conditions including recently diagnosed TIA 08/31/2018 presents to the emergency department following syncopal episode at home tonight.  Patient denies any preceding symptoms.  Patient denies any symptoms since the event.  Patient states that he was closing a storm door when he abruptly lost consciousness.  Incident was witnessed by the patient's wife who stated that he fell backwards striking the right posterior portion of his head.  She states that the patient regained consciousness promptly.  Patient states that he went to the gym tonight and then approximately 30 minutes of cardio and did not have much food ingestion.  Patient denies any nausea vomiting diarrhea.  Patient denies any chest pain, palpitations or shortness of breath.  Patient denies any weakness numbness gait instability.  Past Medical History:  Diagnosis Date  . Follicular lymphoma ([place]) 2014   Chemo tx's.  Marland Kitchen GERD (gastroesophageal reflux disease)   . Hypertension     Patient Active Problem List   Diagnosis Date Noted  . 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 Lymphadenopathy, mesenteric 06/19/2016  . Lymphadenopathy, abdominal 06/19/2016  . Cough 05/28/2016  . 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 Tendinitis of right shoulder 07/13/2015  . Bundle branch block, right 12/28/2014  . B-cell lymphoma ([place]) 08/25/2014  . CC (Crohn's colitis) ([place]) 08/25/2014  . Essential (primary) hypertension 08/25/2014  . Crohn's disease of colon ([place]) 08/25/2014  . Acid reflux 03/11/2014    Past Surgical History:  Procedure Laterality  Date  . BOWEL RESECTION N/A 06/19/2016   Procedure: SMALL BOWEL RESECTION;  Surgeon: Hubbard Robinson, MD;  Location: [facility] ORS;  Service: General;  Laterality: N/A;  . CHOLECYSTECTOMY    . LAPAROSCOPIC REMOVAL OF MESENTERIC MASS N/A 06/19/2016   Procedure: LAPAROSCOPIC REMOVAL OF MESENTERIC MASS;  Surgeon: Hubbard Robinson, MD;  Location: [facility] ORS;  Service: General;  Laterality: N/A;  . LYMPH NODE DISSECTION    . PORT-A-CATH REMOVAL    . PORTACATH PLACEMENT      Prior to Admission medications   Medication Sig Start Date End Date Taking? Authorizing Provider  cetirizine (ZYRTEC) 10 MG tablet Take 1 tablet by mouth daily. 05/19/17   [provider]  fluticasone (FLONASE) 50 MCG/ACT nasal spray Place 1 spray into both nostrils daily as needed for allergies or rhinitis.    [provider]  losartan-hydrochlorothiazide (HYZAAR) 50-12.5 MG per tablet TAKE 1 TABLET DAILY. 11/20/14   [provider]  MELATONIN PO Take 2 mg by mouth at bedtime as needed.     [provider]  Multiple Vitamin (MULTIVITAMIN WITH MINERALS) TABS tablet Take 1 tablet by mouth daily.    [provider]  pantoprazole (PROTONIX) 40 MG tablet Take 40 mg by mouth daily.  02/26/15   [provider]  polyethylene glycol (MIRALAX / GLYCOLAX) packet Take 17 g by mouth daily.    [provider]  Probiotic Product ([place]) Take 1 capsule by mouth daily.    [provider]  ranitidine (ZANTAC) 150 MG tablet Take 150 mg by mouth at bedtime as needed for  heartburn.     [provider]    Allergies No known drug allergies  Family History  Problem Relation Age of Onset  . Clotting disorder Mother   . Lymphoma Father   . Hiatal hernia Father   . Heart disease Father        stent placed  . Cancer Paternal Uncle        esophagus  . Brain cancer Maternal Aunt     Social History Social History   Tobacco Use  . Smoking status:  Never Smoker  . Smokeless tobacco: Never Used  Substance Use Topics  . Alcohol use: No  . Drug use: No    Review of Systems Constitutional: No fever/chills Eyes: No visual changes. ENT: No sore throat. Cardiovascular: Denies chest pain. Respiratory: Denies shortness of breath. Gastrointestinal: No abdominal pain.  No nausea, no vomiting.  No diarrhea.  No constipation. Genitourinary: Negative for dysuria. Musculoskeletal: Negative for neck pain.  Negative for back pain. Integumentary: Negative for rash. Neurological: Negative for headaches, focal weakness or numbness.  Positive for syncope  ____________________________________________   PHYSICAL EXAM:  VITAL SIGNS: ED Triage Vitals  Enc Vitals Group     BP 09/23/18 2307 (!) 160/96     Pulse Rate 09/23/18 2307 67     Resp 09/23/18 2307 18     Temp 09/23/18 2307 97.9 F (36.6 C)     Temp Source 09/23/18 2307 Oral     SpO2 09/23/18 2307 100 %     Weight 09/23/18 2246 95.3 kg (210 lb)     Height 09/23/18 2246 1.854 m (6\' 1" )     Head Circumference --      Peak Flow --      Pain Score 09/23/18 2246 2     Pain Loc --      Pain Edu? --      Excl. in [place]? --     Constitutional: Alert and oriented. Well appearing and in no acute distress. Eyes: Conjunctivae are normal. PERRL. EOMI. Head: Atraumatic. Nose: No congestion/rhinnorhea. Mouth/Throat: Mucous membranes are moist.  Oropharynx non-erythematous. Neck: No stridor.  No meningeal signs.  No cervical spine tenderness to palpation. Cardiovascular: Normal rate, regular rhythm. Good peripheral circulation. Grossly normal heart sounds. Respiratory: Normal respiratory effort.  No retractions. Lungs CTAB. Gastrointestinal: Soft and nontender. No distention.  Musculoskeletal: No lower extremity tenderness nor edema. No gross deformities of extremities. Neurologic:  Normal speech and language. No gross focal neurologic deficits are appreciated.  Skin:  Skin is warm, dry and  intact. No rash noted. Psychiatric: Mood and affect are normal. Speech and behavior are normal.  ____________________________________________   LABS (all labs ordered are listed, but only abnormal results are displayed)  Labs Reviewed  URINALYSIS, COMPLETE (UACMP) WITH MICROSCOPIC - Abnormal; Notable for the following components:      Result Value   Color, Urine YELLOW (*)    APPearance CLEAR (*)    All other components within normal limits  BASIC METABOLIC PANEL  CBC  TROPONIN I  TROPONIN I   ____________________________________________  EKG  ED ECG REPORT I, Pass Christian N Charnese Federici, the attending physician, personally viewed and interpreted this ECG.   Date: 09/24/2018  EKG Time: 11:06 PM  Rate: 58  Rhythm: Normal sinus rhythm, right bundle branch block  Axis: Normal  Intervals: Normal  ST&T Change: None  ____________________________________________  RADIOLOGY I, Melvin N Magda Muise, personally viewed and evaluated these images (plain radiographs) as part of my medical decision making,  as well as reviewing the written report by the radiologist.  ED MD interpretation: Negative CT scan and MRI of the brain  Official radiology report(s): Ct Head Wo Contrast  Result Date: 09/23/2018 CLINICAL DATA:  Fall with head injury. EXAM: CT HEAD WITHOUT CONTRAST TECHNIQUE: Contiguous axial images were obtained from the base of the skull through the vertex without intravenous contrast. COMPARISON:  None. FINDINGS: Brain: Ventricles, cisterns and other CSF spaces are normal. There is no mass, mass effect, shift of midline structures or acute hemorrhage. No evidence of acute infarction. Subtle chronic ischemic microvascular disease. Vascular: No hyperdense vessel or unexpected calcification. Skull: Normal. Negative for fracture or focal lesion. Sinuses/Orbits: Orbits are normal. Paranasal sinuses are well developed and well aerated with minimal air-fluid levels over the maxillary sinuses. Mastoid  air cells are clear. Other: None. IMPRESSION: No acute brain injury. Mild chronic ischemic microvascular disease. Small air-fluid levels over the maxillary sinuses. Electronically Signed   By: Marin Olp M.D.   On: 09/23/2018 23:38      Procedures   ____________________________________________   INITIAL IMPRESSION / ASSESSMENT AND PLAN / ED COURSE  As part of my medical decision making, I reviewed the following data within the electronic MEDICAL RECORD NUMBER   67 year old male presenting with above-stated history and physical exam secondary to syncopal episode.  Considered cardiac versus intracranial injury is potential possibilities EKG revealed no evidence of arrhythmia laboratory data including troponin x2-.  CT and MRI of the brain both unremarkable based on chest criteria no clear etiology for the patient's symptoms and admission not warranted.  Patient is asymptomatic at present.  Patient will be referred to Dr. call with cardiology for further outpatient evaluation. ____________________________________________  FINAL CLINICAL IMPRESSION(S) / ED DIAGNOSES  Final diagnoses:  Syncope, unspecified syncope type     MEDICATIONS GIVEN DURING THIS VISIT:  Medications - No data to display   ED Discharge Orders    None       Note:  This document was prepared using Dragon voice recognition software and may include unintentional dictation errors.    Gregor Hams, MD 09/24/18 [phone]

## 2018-12-09 ENCOUNTER — Inpatient Hospital Stay (HOSPITAL_BASED_OUTPATIENT_CLINIC_OR_DEPARTMENT_OTHER): Payer: Medicare HMO | Admitting: Internal Medicine

## 2018-12-09 ENCOUNTER — Encounter: Payer: Self-pay | Admitting: Internal Medicine

## 2018-12-09 ENCOUNTER — Inpatient Hospital Stay: Payer: Medicare HMO | Attending: Internal Medicine

## 2018-12-09 ENCOUNTER — Other Ambulatory Visit: Payer: Self-pay

## 2018-12-09 VITALS — BP 128/77 | HR 81 | Temp 97.7°F | Resp 16 | Ht 73.0 in | Wt 216.4 lb

## 2018-12-09 DIAGNOSIS — Z7982 Long term (current) use of aspirin: Secondary | ICD-10-CM | POA: Insufficient documentation

## 2018-12-09 DIAGNOSIS — Z79899 Other long term (current) drug therapy: Secondary | ICD-10-CM

## 2018-12-09 DIAGNOSIS — Z9221 Personal history of antineoplastic chemotherapy: Secondary | ICD-10-CM

## 2018-12-09 DIAGNOSIS — R140 Abdominal distension (gaseous): Secondary | ICD-10-CM | POA: Diagnosis not present

## 2018-12-09 DIAGNOSIS — C8203 Follicular lymphoma grade I, intra-abdominal lymph nodes: Secondary | ICD-10-CM | POA: Insufficient documentation

## 2018-12-09 DIAGNOSIS — Z923 Personal history of irradiation: Secondary | ICD-10-CM | POA: Diagnosis not present

## 2018-12-09 DIAGNOSIS — I10 Essential (primary) hypertension: Secondary | ICD-10-CM

## 2018-12-09 LAB — CBC WITH DIFFERENTIAL/PLATELET
Abs Immature Granulocytes: 0.04 10*3/uL (ref 0.00–0.07)
BASOS ABS: 0.1 10*3/uL (ref 0.0–0.1)
BASOS PCT: 1 %
EOS PCT: 6 %
Eosinophils Absolute: 0.3 10*3/uL (ref 0.0–0.5)
HEMATOCRIT: 46.8 % (ref 39.0–52.0)
Hemoglobin: 15.7 g/dL (ref 13.0–17.0)
IMMATURE GRANULOCYTES: 1 %
Lymphocytes Relative: 22 %
Lymphs Abs: 1.1 10*3/uL (ref 0.7–4.0)
MCH: 28.4 pg (ref 26.0–34.0)
MCHC: 33.5 g/dL (ref 30.0–36.0)
MCV: 84.6 fL (ref 80.0–100.0)
Monocytes Absolute: 0.4 10*3/uL (ref 0.1–1.0)
Monocytes Relative: 7 %
NEUTROS PCT: 63 %
NRBC: 0 % (ref 0.0–0.2)
Neutro Abs: 3.1 10*3/uL (ref 1.7–7.7)
PLATELETS: 196 10*3/uL (ref 150–400)
RBC: 5.53 MIL/uL (ref 4.22–5.81)
RDW: 13.9 % (ref 11.5–15.5)
WBC: 4.9 10*3/uL (ref 4.0–10.5)

## 2018-12-09 LAB — COMPREHENSIVE METABOLIC PANEL
ALBUMIN: 4 g/dL (ref 3.5–5.0)
ALT: 34 U/L (ref 0–44)
ANION GAP: 9 (ref 5–15)
AST: 30 U/L (ref 15–41)
Alkaline Phosphatase: 67 U/L (ref 38–126)
BILIRUBIN TOTAL: 0.9 mg/dL (ref 0.3–1.2)
BUN: 17 mg/dL (ref 8–23)
CHLORIDE: 104 mmol/L (ref 98–111)
CO2: 26 mmol/L (ref 22–32)
Calcium: 8.8 mg/dL — ABNORMAL LOW (ref 8.9–10.3)
Creatinine, Ser: 0.93 mg/dL (ref 0.61–1.24)
GFR calc Af Amer: >60 mL/min (ref >60)
GLUCOSE: 159 mg/dL — AB (ref 70–99)
Potassium: 3.8 mmol/L (ref 3.5–5.1)
Sodium: 139 mmol/L (ref 135–145)
TOTAL PROTEIN: 6.8 g/dL (ref 6.5–8.1)

## 2018-12-09 LAB — LACTATE DEHYDROGENASE: LDH: 139 U/L (ref 98–192)

## 2018-12-09 NOTE — Assessment & Plan Note (Addendum)
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  Currently on surveillance.  June 2019-CT scan subcentimeter lymph nodes overall stable.  Clinically stable.  Recommend surveillance CT scan in 7 months.   #Abdominal distention-likely secondary to ventral hernia surgery-no concerns of any malignancy.  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  # DISPOSITION: # Follow up in 7 months/labs-cbc/cmp/LDH-CT prior-Dr.B  # 25 minutes face-to-face with the patient discussing the above plan of care; more than 50% of time spent on prognosis/ natural history; counseling and coordination.

## 2018-12-09 NOTE — Progress Notes (Signed)
[place] OFFICE PROGRESS NOTE  Patient Care Team: Rusty Aus, MD as PCP - General (Internal Medicine) Hubbard Robinson, MD as Consulting Physician (Surgery)  Cancer Staging No matching staging information was found for the patient.   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 Recm surviellance; Dr.Crystal. 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  Patient said he had a neurology evaluation in November 2019 when he passed out.  MRI negative for any acute process.  Patient was also evaluated by [facility] ophthalmology for amaurosis fugax.  He is currently being followed by neurology.  Has not had any similar episodes again.  Continues to have intermittent abdominal distention.  Otherwise denies any weight loss but appetite is fair.  No weight loss.  Chronic mild fatigue. . Review of Systems  Constitutional: Positive for  malaise/fatigue. Negative for chills, diaphoresis, fever and weight loss.  HENT: Negative for nosebleeds and sore throat.   Eyes: Negative for double vision.  Respiratory: Negative for cough, hemoptysis, sputum production, shortness of breath and wheezing.   Cardiovascular: Negative for chest pain, palpitations, orthopnea and leg swelling.  Gastrointestinal: Negative for abdominal pain, blood in stool, constipation, diarrhea, heartburn, melena and vomiting.       Abdominal distention/ventral hernia.  Genitourinary: Negative for dysuria, frequency and urgency.  Musculoskeletal: Negative for back pain and joint pain.  Skin: Negative.  Negative for itching and rash.  Neurological: Negative for dizziness, tingling, focal weakness, weakness and headaches.  Endo/Heme/Allergies: Does not bruise/bleed easily.  Psychiatric/Behavioral: Negative for depression. The patient is not nervous/anxious and does not have insomnia.       PAST MEDICAL HISTORY :  Past Medical History:  Diagnosis Date  . Follicular lymphoma (Troy Reynolds) 2014   Chemo tx's.  Marland Kitchen GERD (gastroesophageal reflux disease)   . Hypertension     PAST SURGICAL HISTORY :   Past Surgical History:  Procedure Laterality Date  . BOWEL RESECTION N/A 06/19/2016   Procedure: SMALL BOWEL RESECTION;  Surgeon: Hubbard Robinson, MD;  Location: [facility] ORS;  Service: General;  Laterality: N/A;  . CHOLECYSTECTOMY    . LAPAROSCOPIC REMOVAL OF MESENTERIC MASS N/A 06/19/2016   Procedure: LAPAROSCOPIC REMOVAL OF MESENTERIC MASS;  Surgeon: Hubbard Robinson, MD;  Location: [facility] ORS;  Service: General;  Laterality: N/A;  . LYMPH NODE DISSECTION    . PORT-A-CATH REMOVAL    . PORTACATH PLACEMENT  FAMILY HISTORY :   Family History  Problem Relation Age of Onset  . Clotting disorder Mother   . Lymphoma Father   . Hiatal hernia Father   . Heart disease Father        stent placed  . Cancer Paternal Uncle        esophagus  . Brain cancer Maternal Aunt      SOCIAL HISTORY:   Social History   Tobacco Use  . Smoking status: Never Smoker  . Smokeless tobacco: Never Used  Substance Use Topics  . Alcohol use: No  . Drug use: No    ALLERGIES:  has No Known Allergies.  MEDICATIONS:  Current Outpatient Medications  Medication Sig Dispense Refill  . aspirin 81 MG chewable tablet Chew 1 tablet by mouth daily.    . cetirizine (ZYRTEC) 10 MG tablet Take 1 tablet by mouth daily.    . fluticasone (FLONASE) 50 MCG/ACT nasal spray Place 1 spray into both nostrils daily as needed for allergies or rhinitis.    Marland Kitchen losartan-hydrochlorothiazide (HYZAAR) 50-12.5 MG per tablet TAKE 1 TABLET DAILY.    Marland Kitchen MELATONIN PO Take 2 mg by mouth at bedtime as needed.     . Multiple Vitamin (MULTIVITAMIN WITH MINERALS) TABS tablet Take 1 tablet by mouth daily.    . pantoprazole (PROTONIX) 40 MG tablet Take 40 mg by mouth daily.     . polyethylene glycol (MIRALAX / GLYCOLAX) packet Take 17 g by mouth daily.    . Probiotic Product (PHILLIPS COLON HEALTH PO) Take 1 capsule by mouth daily.    . ranitidine (ZANTAC) 150 MG tablet Take 150 mg by mouth at bedtime as needed for heartburn.     . simvastatin (ZOCOR) 20 MG tablet Take 1 tablet by mouth daily.     No current facility-administered medications for this visit.     PHYSICAL EXAMINATION: ECOG PERFORMANCE STATUS: 1 - Symptomatic but completely ambulatory  BP 128/77 (BP Location: Left Arm, Patient Position: Sitting)   Pulse 81   Temp 97.7 F (36.5 C) (Tympanic)   Resp 16   Ht 6\' 1"  (1.854 m)   Wt 216 lb 6.4 oz (98.2 kg)   BMI 28.55 kg/m   Filed Weights   12/09/18 1015  Weight: 216 lb 6.4 oz (98.2 kg)   Physical Exam  Constitutional: He is oriented to person, place, and time and well-developed, well-nourished, and in no distress.  Accompanied by his wife.  HENT:  Head: Normocephalic and atraumatic.  Mouth/Throat: Oropharynx is clear and moist. No oropharyngeal exudate.  Eyes: Pupils are equal, round,  and reactive to light.  Neck: Normal range of motion. Neck supple.  Cardiovascular: Normal rate and regular rhythm.  Pulmonary/Chest: No respiratory distress. He has no wheezes.  Abdominal: Soft. Bowel sounds are normal. He exhibits no distension and no mass. There is no abdominal tenderness. There is no rebound and no guarding.  Midabdominal incision noted.  Well-healed.  Musculoskeletal: Normal range of motion.        General: No tenderness or edema.  Neurological: He is alert and oriented to person, place, and time.  Skin: Skin is warm.  Psychiatric: Affect normal.      LABORATORY DATA:  I have reviewed the data as listed    Component Value Date/Time   NA 139 12/09/2018 0936   NA 140 03/12/2015 1007   K 3.8 12/09/2018 0936   K 3.7 03/12/2015 1007   CL 104 12/09/2018 0936   CL 105 03/12/2015  1007   CO2 26 12/09/2018 0936   CO2 29 03/12/2015 1007   GLUCOSE 159 (H) 12/09/2018 0936   GLUCOSE 143 (H) 03/12/2015 1007   BUN 17 12/09/2018 0936   BUN 15 03/12/2015 1007   CREATININE 0.93 12/09/2018 0936   CREATININE 0.92 03/12/2015 1007   CALCIUM 8.8 (L) 12/09/2018 0936   CALCIUM 8.6 (L) 03/12/2015 1007   PROT 6.8 12/09/2018 0936   PROT 6.6 03/12/2015 1007   ALBUMIN 4.0 12/09/2018 0936   ALBUMIN 4.0 03/12/2015 1007   AST 30 12/09/2018 0936   AST 26 03/12/2015 1007   ALT 34 12/09/2018 0936   ALT 37 03/12/2015 1007   ALKPHOS 67 12/09/2018 0936   ALKPHOS 79 03/12/2015 1007   BILITOT 0.9 12/09/2018 0936   BILITOT 0.8 03/12/2015 1007   GFRNONAA >60 12/09/2018 0936   GFRNONAA >60 03/12/2015 1007   GFRAA >60 12/09/2018 0936   GFRAA >60 03/12/2015 1007    No results found for: SPEP, UPEP  Lab Results  Component Value Date   WBC 4.9 12/09/2018   NEUTROABS 3.1 12/09/2018   HGB 15.7 12/09/2018   HCT 46.8 12/09/2018   MCV 84.6 12/09/2018   PLT 196 12/09/2018      Chemistry      Component Value Date/Time   NA 139 12/09/2018 0936   NA 140 03/12/2015 1007   K 3.8  12/09/2018 0936   K 3.7 03/12/2015 1007   CL 104 12/09/2018 0936   CL 105 03/12/2015 1007   CO2 26 12/09/2018 0936   CO2 29 03/12/2015 1007   BUN 17 12/09/2018 0936   BUN 15 03/12/2015 1007   CREATININE 0.93 12/09/2018 0936   CREATININE 0.92 03/12/2015 1007      Component Value Date/Time   CALCIUM 8.8 (L) 12/09/2018 0936   CALCIUM 8.6 (L) 03/12/2015 1007   ALKPHOS 67 12/09/2018 0936   ALKPHOS 79 03/12/2015 1007   AST 30 12/09/2018 0936   AST 26 03/12/2015 1007   ALT 34 12/09/2018 0936   ALT 37 03/12/2015 1007   BILITOT 0.9 12/09/2018 0936   BILITOT 0.8 03/12/2015 1007       RADIOGRAPHIC STUDIES: I have personally reviewed the radiological images as listed and agreed with the findings in the report. No results found.   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  Currently on surveillance.  June 2019-CT scan subcentimeter lymph nodes overall stable.  Clinically stable.  Recommend surveillance CT scan in 7 months.   #Abdominal distention-likely secondary to ventral hernia surgery-no concerns of any malignancy.  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  # DISPOSITION: # Follow up in 7 months/labs-cbc/cmp/LDH-CT prior-Dr.B  # 25 minutes face-to-face with the patient discussing the above plan of care; more than 50% of time spent on prognosis/ natural history; counseling and coordination.    Orders Placed This Encounter  Procedures  . CT Abdomen Pelvis W Contrast    Standing Status:   Future    Standing Expiration Date:   12/09/2019    Order Specific Question:   ** REASON FOR EXAM (FREE TEXT)    Answer:   follicular lymphoma    Order Specific Question:   If indicated for the ordered procedure, I  authorize the administration of contrast  media per Radiology protocol    Answer:   Yes    Order Specific Question:   Preferred imaging location?    Answer:   [facility]    Order Specific Question:   Is Oral Contrast requested for this exam?    Answer:   Yes, Per Radiology protocol    Order Specific Question:   Radiology Contrast Protocol - do NOT remove file path    Answer:   [email or web address]  . CBC with Differential    Standing Status:   Future    Standing Expiration Date:   12/10/2019  . Comprehensive metabolic panel    Standing Status:   Future    Standing Expiration Date:   12/10/2019  . Lactate dehydrogenase    Standing Status:   Future    Standing Expiration Date:   12/10/2019   All questions were answered. The patient knows to call the clinic with any problems, questions or concerns.      Cammie Sickle, MD 12/09/2018 6:07 PM

## 2018-12-09 NOTE — Progress Notes (Signed)
Patient here for follow up. In October 2019 patient had an episode of syncope he is being followed by cardiology and neurology.

## 2019-05-21 ENCOUNTER — Emergency Department: Payer: Medicare HMO

## 2019-05-21 ENCOUNTER — Encounter: Payer: Self-pay | Admitting: Emergency Medicine

## 2019-05-21 ENCOUNTER — Emergency Department
Admission: EM | Admit: 2019-05-21 | Discharge: 2019-05-21 | Disposition: A | Discharge status: Home / Self Care | Payer: Medicare HMO | Attending: Emergency Medicine | Admitting: Emergency Medicine

## 2019-05-21 ENCOUNTER — Other Ambulatory Visit: Payer: Self-pay

## 2019-05-21 DIAGNOSIS — I10 Essential (primary) hypertension: Secondary | ICD-10-CM | POA: Diagnosis not present

## 2019-05-21 DIAGNOSIS — Z8572 Personal history of non-Hodgkin lymphomas: Secondary | ICD-10-CM | POA: Insufficient documentation

## 2019-05-21 DIAGNOSIS — Z7982 Long term (current) use of aspirin: Secondary | ICD-10-CM | POA: Insufficient documentation

## 2019-05-21 DIAGNOSIS — Z9049 Acquired absence of other specified parts of digestive tract: Secondary | ICD-10-CM | POA: Insufficient documentation

## 2019-05-21 DIAGNOSIS — Z79899 Other long term (current) drug therapy: Secondary | ICD-10-CM | POA: Diagnosis not present

## 2019-05-21 DIAGNOSIS — U071 COVID-19: Secondary | ICD-10-CM | POA: Insufficient documentation

## 2019-05-21 DIAGNOSIS — R0602 Shortness of breath: Secondary | ICD-10-CM | POA: Diagnosis present

## 2019-05-21 LAB — BASIC METABOLIC PANEL
Anion gap: 10 (ref 5–15)
BUN: 14 mg/dL (ref 8–23)
CO2: 24 mmol/L (ref 22–32)
Calcium: 8.4 mg/dL — ABNORMAL LOW (ref 8.9–10.3)
Chloride: 102 mmol/L (ref 98–111)
Creatinine, Ser: 0.75 mg/dL (ref 0.61–1.24)
GFR calc Af Amer: >60 mL/min (ref >60)
GFR calc non Af Amer: >60 mL/min (ref >60)
Glucose, Bld: 139 mg/dL — ABNORMAL HIGH (ref 70–99)
Potassium: 4 mmol/L (ref 3.5–5.1)
Sodium: 136 mmol/L (ref 135–145)

## 2019-05-21 LAB — CBC WITH DIFFERENTIAL/PLATELET
Abs Immature Granulocytes: 0.19 10*3/uL — ABNORMAL HIGH (ref 0.00–0.07)
Basophils Absolute: 0 10*3/uL (ref 0.0–0.1)
Basophils Relative: 0 %
Eosinophils Absolute: 0 10*3/uL (ref 0.0–0.5)
Eosinophils Relative: 0 %
HCT: 43.4 % (ref 39.0–52.0)
Hemoglobin: 14.5 g/dL (ref 13.0–17.0)
Immature Granulocytes: 2 %
Lymphocytes Relative: 4 %
Lymphs Abs: 0.4 10*3/uL — ABNORMAL LOW (ref 0.7–4.0)
MCH: 28.3 pg (ref 26.0–34.0)
MCHC: 33.4 g/dL (ref 30.0–36.0)
MCV: 84.8 fL (ref 80.0–100.0)
Monocytes Absolute: 1 10*3/uL (ref 0.1–1.0)
Monocytes Relative: 9 %
Neutro Abs: 9.6 10*3/uL — ABNORMAL HIGH (ref 1.7–7.7)
Neutrophils Relative %: 85 %
Platelets: 259 10*3/uL (ref 150–400)
RBC: 5.12 MIL/uL (ref 4.22–5.81)
RDW: 13.4 % (ref 11.5–15.5)
WBC: 11.2 10*3/uL — ABNORMAL HIGH (ref 4.0–10.5)
nRBC: 0 % (ref 0.0–0.2)

## 2019-05-21 LAB — PROCALCITONIN: Procalcitonin: <0.1 ng/mL

## 2019-05-21 LAB — SEDIMENTATION RATE: Sed Rate: 43 mm/hr — ABNORMAL HIGH (ref 0–20)

## 2019-05-21 MED ORDER — SODIUM CHLORIDE 0.9 % IV BOLUS
1000.0000 mL | Freq: Once | INTRAVENOUS | Status: AC
  Administered 2019-05-21: 1000 mL via INTRAVENOUS

## 2019-05-21 MED ORDER — ONDANSETRON HCL 4 MG/2ML IJ SOLN
4.0000 mg | Freq: Once | INTRAMUSCULAR | Status: AC
  Administered 2019-05-21: 4 mg via INTRAVENOUS
  Filled 2019-05-21: qty 2

## 2019-05-21 MED ORDER — AZITHROMYCIN 500 MG PO TABS
500.0000 mg | ORAL_TABLET | Freq: Once | ORAL | Status: AC
  Administered 2019-05-21: 500 mg via ORAL
  Filled 2019-05-21: qty 1

## 2019-05-21 MED ORDER — AZITHROMYCIN 250 MG PO TABS: ORAL_TABLET | ORAL | 0 refills | Status: DC

## 2019-05-21 MED ORDER — ONDANSETRON 4 MG PO TBDP: 4.0000 mg | ORAL_TABLET | Freq: Four times a day (QID) | ORAL | 0 refills | Status: DC | PRN

## 2019-05-21 NOTE — ED Notes (Signed)
Ambulatory 50 paces in room, SAT maintains 95%. Patient does have sensation of slight SOB with ambulation.

## 2019-05-21 NOTE — Discharge Instructions (Signed)
Person Under Monitoring Name: Troy Reynolds  Location: [place] [address]   Infection Prevention Recommendations for Individuals Confirmed to have, or Being Evaluated for, 2019 Novel Coronavirus (COVID-19) Infection Who Receive Care at Home  Individuals who are confirmed to have, or are being evaluated for, COVID-19 should follow the prevention steps below until a healthcare provider or local or state health department says they can return to normal activities.  Stay home except to get medical care You should restrict activities outside your home, except for getting medical care. Do not go to work, school, or public areas, and do not use public transportation or taxis.  Call ahead before visiting your doctor Before your medical appointment, call the healthcare provider and tell them that you have, or are being evaluated for, COVID-19 infection. This will help the healthcare providers office take steps to keep other people from getting infected. Ask your healthcare provider to call the local or state health department.  Monitor your symptoms Seek prompt medical attention if your illness is worsening (e.g., difficulty breathing). Before going to your medical appointment, call the healthcare provider and tell them that you have, or are being evaluated for, COVID-19 infection. Ask your healthcare provider to call the local or state health department.  Wear a facemask You should wear a facemask that covers your nose and mouth when you are in the same room with other people and when you visit a healthcare provider. People who live with or visit you should also wear a facemask while they are in the same room with you.  Separate yourself from other people in your home As much as possible, you should stay in a different room from other people in your home. Also, you should use a separate bathroom, if available.  Avoid sharing household items You should not  share dishes, drinking glasses, cups, eating utensils, towels, bedding, or other items with other people in your home. After using these items, you should wash them thoroughly with soap and water.  Cover your coughs and sneezes Cover your mouth and nose with a tissue when you cough or sneeze, or you can cough or sneeze into your sleeve. Throw used tissues in a lined trash can, and immediately wash your hands with soap and water for at least 20 seconds or use an alcohol-based hand rub.  Wash your [facility] your hands often and thoroughly with soap and water for at least 20 seconds. You can use an alcohol-based hand sanitizer if soap and water are not available and if your hands are not visibly dirty. Avoid touching your eyes, nose, and mouth with unwashed hands.   Prevention Steps for Caregivers and Household Members of Individuals Confirmed to have, or Being Evaluated for, COVID-19 Infection Being Cared for in the Home  If you live with, or provide care at home for, a person confirmed to have, or being evaluated for, COVID-19 infection please follow these guidelines to prevent infection:  Follow healthcare providers instructions Make sure that you understand and can help the patient follow any healthcare provider instructions for all care.  Provide for the patients basic needs You should help the patient with basic needs in the home and provide support for getting groceries, prescriptions, and other personal needs.  Monitor the patients symptoms If they are getting sicker, call his or her medical provider and tell them that the patient has, or is being evaluated for, COVID-19 infection. This will help the healthcare providers  office take steps to keep other people from getting infected. Ask the healthcare provider to call the local or state health department.  Limit the number of people who have contact with the patient If possible, have only one caregiver for the  patient. Other household members should stay in another home or place of residence. If this is not possible, they should stay in another room, or be separated from the patient as much as possible. Use a separate bathroom, if available. Restrict visitors who do not have an essential need to be in the home.  Keep older adults, very young children, and other sick people away from the patient Keep older adults, very young children, and those who have compromised immune systems or chronic health conditions away from the patient. This includes people with chronic heart, lung, or kidney conditions, diabetes, and cancer.  Ensure good ventilation Make sure that shared spaces in the home have good air flow, such as from an air conditioner or an opened window, weather permitting.  Wash your hands often Wash your hands often and thoroughly with soap and water for at least 20 seconds. You can use an alcohol based hand sanitizer if soap and water are not available and if your hands are not visibly dirty. Avoid touching your eyes, nose, and mouth with unwashed hands. Use disposable paper towels to dry your hands. If not available, use dedicated cloth towels and replace them when they become wet.  Wear a facemask and gloves Wear a disposable facemask at all times in the room and gloves when you touch or have contact with the patients blood, body fluids, and/or secretions or excretions, such as sweat, saliva, sputum, nasal mucus, vomit, urine, or feces.  Ensure the mask fits over your nose and mouth tightly, and do not touch it during use. Throw out disposable facemasks and gloves after using them. Do not reuse. Wash your hands immediately after removing your facemask and gloves. If your personal clothing becomes contaminated, carefully remove clothing and launder. Wash your hands after handling contaminated clothing. Place all used disposable facemasks, gloves, and other waste in a lined container before  disposing them with other household waste. Remove gloves and wash your hands immediately after handling these items.  Do not share dishes, glasses, or other household items with the patient Avoid sharing household items. You should not share dishes, drinking glasses, cups, eating utensils, towels, bedding, or other items with a patient who is confirmed to have, or being evaluated for, COVID-19 infection. After the person uses these items, you should wash them thoroughly with soap and water.  Wash laundry thoroughly Immediately remove and wash clothes or bedding that have blood, body fluids, and/or secretions or excretions, such as sweat, saliva, sputum, nasal mucus, vomit, urine, or feces, on them. Wear gloves when handling laundry from the patient. Read and follow directions on labels of laundry or clothing items and detergent. In general, wash and dry with the warmest temperatures recommended on the label.  Clean all areas the individual has used often Clean all touchable surfaces, such as counters, tabletops, doorknobs, bathroom fixtures, toilets, phones, keyboards, tablets, and bedside tables, every day. Also, clean any surfaces that may have blood, body fluids, and/or secretions or excretions on them. Wear gloves when cleaning surfaces the patient has come in contact with. Use a diluted bleach solution (e.g., dilute bleach with 1 part bleach and 10 parts water) or a household disinfectant with a label that says EPA-registered for coronaviruses. To make a  bleach solution at home, add 1 tablespoon of bleach to 1 quart (4 cups) of water. For a larger supply, add  cup of bleach to 1 gallon (16 cups) of water. Read labels of cleaning products and follow recommendations provided on product labels. Labels contain instructions for safe and effective use of the cleaning product including precautions you should take when applying the product, such as wearing gloves or eye protection and making sure you  have good ventilation during use of the product. Remove gloves and wash hands immediately after cleaning.  Monitor yourself for signs and symptoms of illness Caregivers and household members are considered close contacts, should monitor their health, and will be asked to limit movement outside of the home to the extent possible. Follow the monitoring steps for close contacts listed on the symptom monitoring form.   ? If you have additional questions, contact your local health department or call the epidemiologist on call at [phone] (available 24/7). ? This guidance is subject to change. For the most up-to-date guidance from [facility], please refer to their website: YouBlogs.pl

## 2019-05-21 NOTE — ED Triage Notes (Signed)
States began sore throat and cough 6/15. Was tested 6/19 and given positive COVID result 6/21. Nauseated.

## 2019-05-21 NOTE — ED Provider Notes (Signed)
[facility] Emergency Department Provider Note   ____________________________________________   First MD Initiated Contact with Patient 05/21/19 1302     (approximate)  I have reviewed the triage vital signs and the nursing notes.   HISTORY  Chief Complaint Cough and Sore Throat    HPI Troy Reynolds is a 68 y.o. male diagnosed with COVID earlier this week.  Comes today for evaluation for shortness of breath.  Shortness of breath especially during the evening last night.  Is a little better but still having some shortness of breath at rest.  Had fevers and chills earlier in the week.  Having some nausea with decreased appetite but no diarrhea and no vomiting.   No chest pain.  No immunosuppression.  History of previous lymphoma in remission.  Started with symptoms of sore throat and cough.  Started on steroids by his primary doctor on Thursday.  Fatigue, general weakness, light sensation of shortness of breath worse last night but better now  Past Medical History:  Diagnosis Date  . 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  Marland Kitchen GERD (gastroesophageal reflux disease)   . Hypertension     Patient Active Problem List   Diagnosis Date Noted  . 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 Lymphadenopathy, mesenteric 06/19/2016  . Lymphadenopathy, abdominal 06/19/2016  . Cough 05/28/2016  . 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 Tendinitis of right shoulder 07/13/2015  . Bundle branch block, right 12/28/2014  . B-cell lymphoma ([place]) 08/25/2014  . CC (Crohn's colitis) ([place]) 08/25/2014  . Essential (primary) hypertension 08/25/2014  . Crohn's disease of colon ([place]) 08/25/2014  . Acid reflux 03/11/2014    Past Surgical History:  Procedure Laterality Date  . BOWEL RESECTION N/A 06/19/2016   Procedure: SMALL BOWEL RESECTION;  Surgeon: Hubbard Robinson, MD;  Location: [facility] ORS;  Service:  General;  Laterality: N/A;  . CHOLECYSTECTOMY    . LAPAROSCOPIC REMOVAL OF MESENTERIC MASS N/A 06/19/2016   Procedure: LAPAROSCOPIC REMOVAL OF MESENTERIC MASS;  Surgeon: Hubbard Robinson, MD;  Location: [facility] ORS;  Service: General;  Laterality: N/A;  . LYMPH NODE DISSECTION    . PORT-A-CATH REMOVAL    . PORTACATH PLACEMENT      Prior to Admission medications   Medication Sig Start Date End Date Taking? Authorizing Provider  aspirin 81 MG chewable tablet Chew 1 tablet by mouth daily.    [provider]  azithromycin (ZITHROMAX) 250 MG tablet Take 1 tablet daily by mouth 05/21/19   Delman Kitten, MD  cetirizine (ZYRTEC) 10 MG tablet Take 1 tablet by mouth daily. 05/19/17   [provider]  fluticasone (FLONASE) 50 MCG/ACT nasal spray Place 1 spray into both nostrils daily as needed for allergies or rhinitis.    [provider]  losartan-hydrochlorothiazide (HYZAAR) 50-12.5 MG per tablet TAKE 1 TABLET DAILY. 11/20/14   [provider]  MELATONIN PO Take 2 mg by mouth at bedtime as needed.     [provider]  Multiple Vitamin (MULTIVITAMIN WITH MINERALS) TABS tablet Take 1 tablet by mouth daily.    [provider]  ondansetron (ZOFRAN ODT) 4 MG disintegrating tablet Take 1 tablet (4 mg total) by mouth every 6 (six) hours as needed for nausea or vomiting. 05/21/19   Delman Kitten, MD  pantoprazole (PROTONIX) 40 MG tablet Take 40 mg by mouth daily.  02/26/15   [provider]  polyethylene glycol (MIRALAX /  GLYCOLAX) packet Take 17 g by mouth daily.    [provider]  Probiotic Product ([place]) Take 1 capsule by mouth daily.    [provider]  ranitidine (ZANTAC) 150 MG tablet Take 150 mg by mouth at bedtime as needed for heartburn.     [provider]  simvastatin (ZOCOR) 20 MG tablet Take 1 tablet by mouth daily. 08/19/18 08/19/19  [provider]    Allergies Patient has no known  allergies.  Family History  Problem Relation Age of Onset  . Clotting disorder Mother   . Lymphoma Father   . Hiatal hernia Father   . Heart disease Father        stent placed  . Cancer Paternal Uncle        esophagus  . Brain cancer Maternal Aunt     Social History Social History   Tobacco Use  . Smoking status: Never Smoker  . Smokeless tobacco: Never Used  Substance Use Topics  . Alcohol use: No  . Drug use: No    Review of Systems Constitutional: No fever/chills presently but earlier in the week Eyes: No visual changes. ENT: No sore throat now but have earlier in the week. Cardiovascular: Denies chest pain. Respiratory: Slight feeling of shortness of breath.  Worsens with walking.  Dry cough. Gastrointestinal: No abdominal pain.  Nausea and decreased appetite. Musculoskeletal: Negative for back pain. Skin: Negative for rash. Neurological: Negative for headaches, areas of focal weakness or numbness.    ____________________________________________   PHYSICAL EXAM:  VITAL SIGNS: ED Triage Vitals  Enc Vitals Group     BP 05/21/19 1210 125/79     Pulse Rate 05/21/19 1210 84     Resp 05/21/19 1210 20     Temp 05/21/19 1210 98.7 F (37.1 C)     Temp Source 05/21/19 1210 Oral     SpO2 05/21/19 1210 94 %     Weight 05/21/19 1212 215 lb (97.5 kg)     Height 05/21/19 1212 6\' 1"  (1.854 m)     Head Circumference --      Peak Flow --      Pain Score 05/21/19 1211 6     Pain Loc --      Pain Edu? --      Excl. in [place]? --     Constitutional: Alert and oriented. Well appearing and in no acute distress.  Very pleasant. Eyes: Conjunctivae are normal. Head: Atraumatic. Nose: No congestion/rhinnorhea. Mouth/Throat: Mucous membranes are slightly dry. Neck: No stridor.  Cardiovascular: Normal rate, regular rhythm. Grossly normal heart sounds.  Good peripheral circulation.  Frequent dry cough. Respiratory: Normal respiratory effort.  No retractions. Lungs CTAB.  Gastrointestinal: Soft and nontender. No distention. Musculoskeletal: No lower extremity tenderness nor edema. Neurologic:  Normal speech and language. No gross focal neurologic deficits are appreciated.  Skin:  Skin is warm, dry and intact. No rash noted. Psychiatric: Mood and affect are normal. Speech and behavior are normal.  ____________________________________________   LABS (all labs ordered are listed, but only abnormal results are displayed)  Labs Reviewed  BASIC METABOLIC PANEL - Abnormal; Notable for the following components:      Result Value   Glucose, Bld 139 (*)    Calcium 8.4 (*)    All other components within normal limits  CBC WITH DIFFERENTIAL/PLATELET - Abnormal; Notable for the following components:   WBC 11.2 (*)    Neutro Abs 9.6 (*)    Lymphs Abs  0.4 (*)    Abs Immature Granulocytes 0.19 (*)    All other components within normal limits  SEDIMENTATION RATE - Abnormal; Notable for the following components:   Sed Rate 43 (*)    All other components within normal limits  PROCALCITONIN   ____________________________________________  EKG   ____________________________________________  RADIOLOGY  Dg Chest Portable 1 View  Result Date: 05/21/2019 CLINICAL DATA:  68 year old male with cough and sore throat EXAM: PORTABLE CHEST 1 VIEW COMPARISON:  CT 06/01/2017 05/28/2016 FINDINGS: Reticulonodular opacities throughout the lungs, more pronounced in the mid and lower lungs. No pneumothorax. No pleural effusion. No displaced fracture. IMPRESSION: Diffuse reticulonodular opacities of the bilateral lungs, concerning for multifocal infection. Electronically Signed   By: Corrie Mckusick D.O.   On: 05/21/2019 13:55    ____________________________________________   PROCEDURES  Procedure(s) performed: None  Procedures  Critical Care performed: No  ____________________________________________   INITIAL IMPRESSION / ASSESSMENT AND PLAN / ED COURSE  Pertinent  labs & imaging results that were available during my care of the patient were reviewed by me and considered in my medical decision making (see chart for details).   Reassuring nontoxic examination.  No present hypoxia.  Does report some shortness of breath last night that was worse but this seems to be improved now.  He is known COVID positive.  His clinical examination does not demonstrate evidence of decompensation.  No chest pain.  Reassuring vital signs.  Currently on Decadron by primary care physician.  Chest x-Reynolds reviewed, also in review of his low procalcitonin this appears consistent with acute viral infection.  COVID-19.  I will add azithromycin given the multifocal nature of his chest x-Reynolds as well.  Zofran and hydration have worked well and the patient appears well on reevaluation prior to discharge.  Patient will follow-up with his primary physician Emily Filbert who I have also contacted via haiku messaging for follow-up.  Both he and his wife have home pulse oximeter.  I notified them that they should definitely return should they have any low oximetry readings especially any in the 80s or lower.  He also knows to come back if he is to develop any increased shortness of breath, worsening symptoms, weakness, fatigue or other new concerns arise.  At the present time the patient appears stable and appropriate for ongoing outpatient care, but I did express to both him and his wife the need for very careful and cautious return precautions which they are both in agreement with  Return precautions and treatment recommendations and follow-up discussed with the patient who is agreeable with the plan.  Patient ambulated well without hypoxia, and only a light feeling of shortness of breath with ambulation.      ELYA DILORETO was evaluated in Emergency Department on 05/21/2019 for the symptoms described in the history of present illness. He was evaluated in the context of the global COVID-19  pandemic, which necessitated consideration that the patient might be at risk for infection with the SARS-CoV-2 virus that causes COVID-19. Institutional protocols and algorithms that pertain to the evaluation of patients at risk for COVID-19 are in a state of rapid change based on information released by regulatory bodies including the CDC and federal and state organizations. These policies and algorithms were followed during the patient's care in the ED.  Known COVID positive  ____________________________________________   FINAL CLINICAL IMPRESSION(S) / ED DIAGNOSES  Final diagnoses:  COVID-19 virus infection        Note:  This  document was prepared using Systems analyst and may include unintentional dictation errors       Delman Kitten, MD 05/21/19 [phone]

## 2019-05-21 NOTE — ED Notes (Signed)
Lab called to add on PCT

## 2019-06-22 IMAGING — US US CAROTID DUPLEX BILAT
1 series · 13 of 24 positions shown · non-contrast
Comparison: None.

CLINICAL DATA: 67-year-old male with amaurosis fugax

EXAM:
BILATERAL CAROTID DUPLEX ULTRASOUND
TECHNIQUE: Gray scale imaging, color Doppler and duplex ultrasound were
performed of bilateral carotid and vertebral arteries in the neck.

[Series 1: us carotid duplex bilat · 0.06mm/px · 13 of 70 slices shown]
[im 1/70]
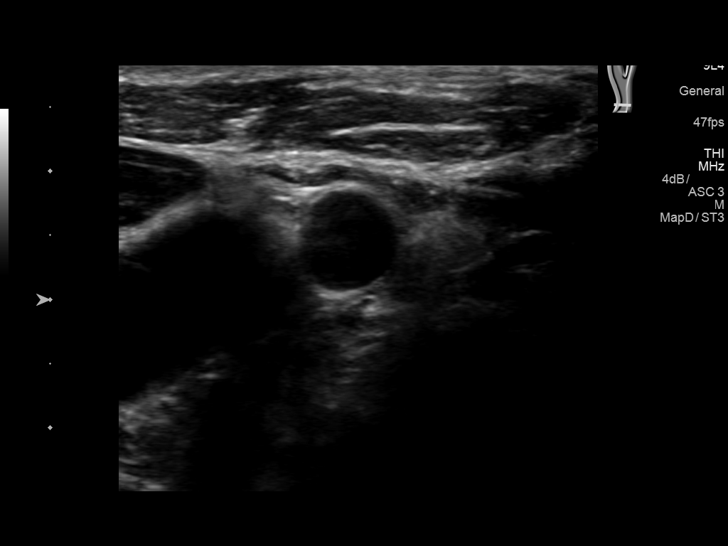
[im 7/70]
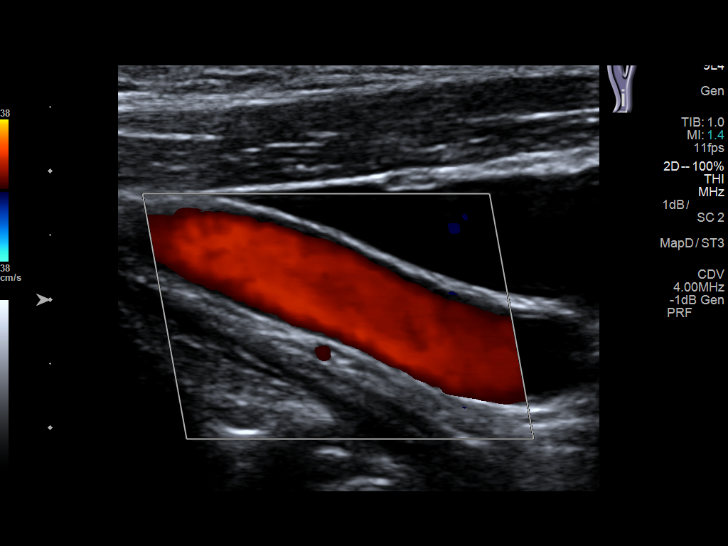
[im 13/70]
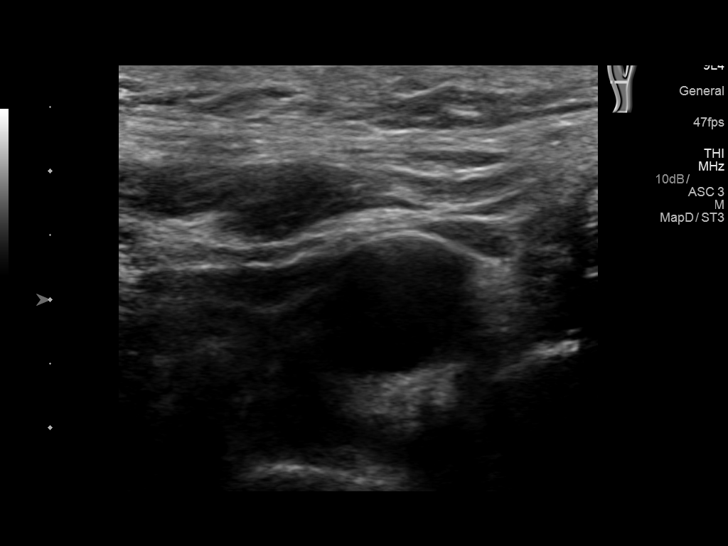
[im 19/70]
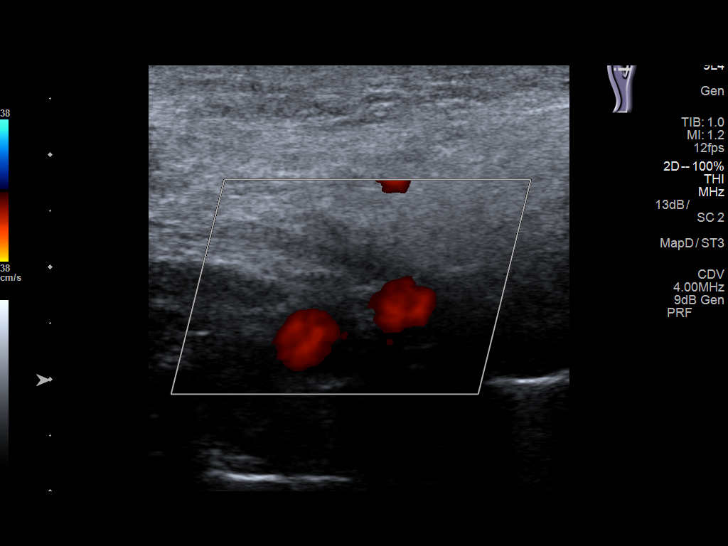
[im 25/70]
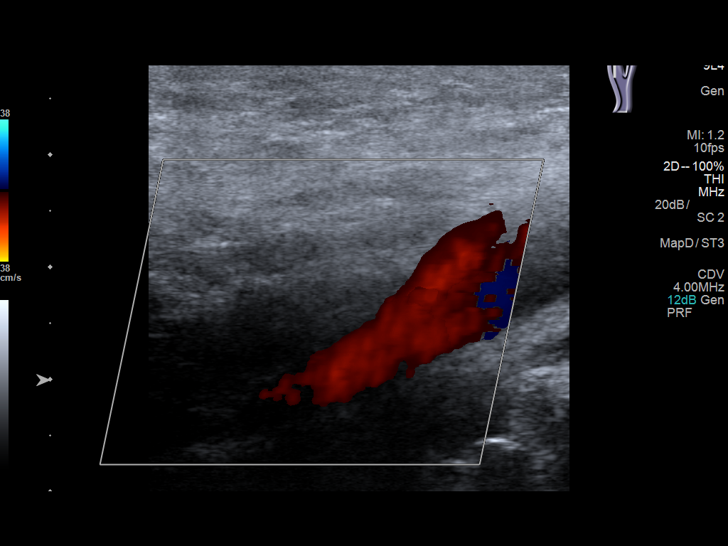
[im 31/70]
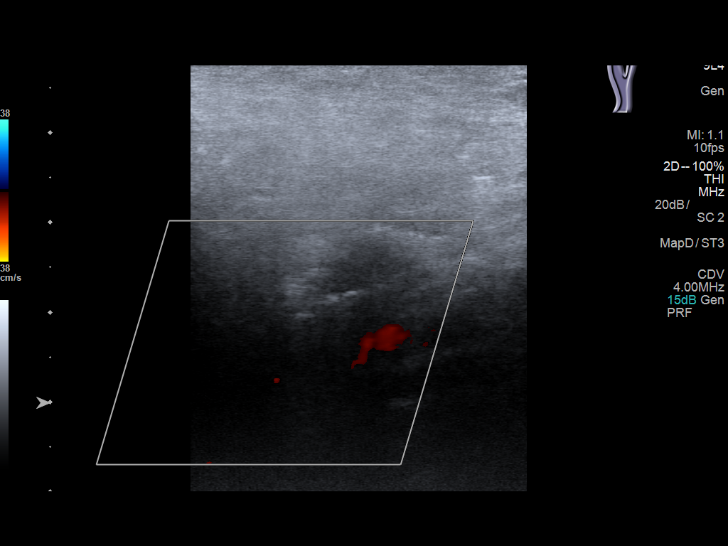
[im 37/70]
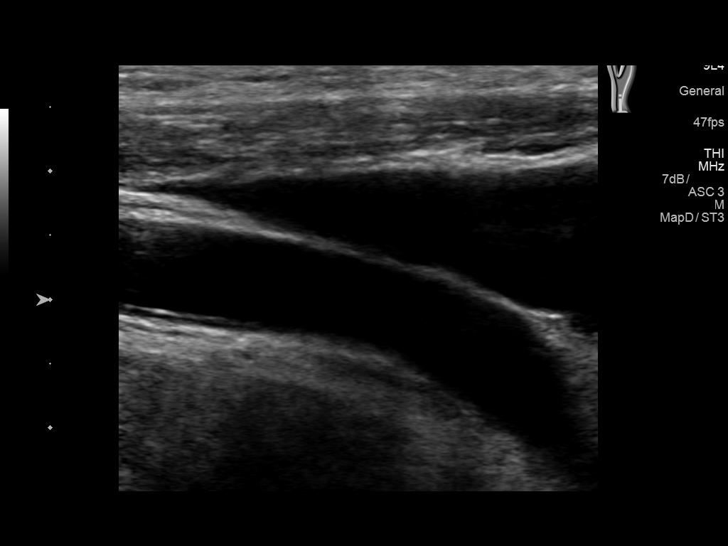
[im 40/70]
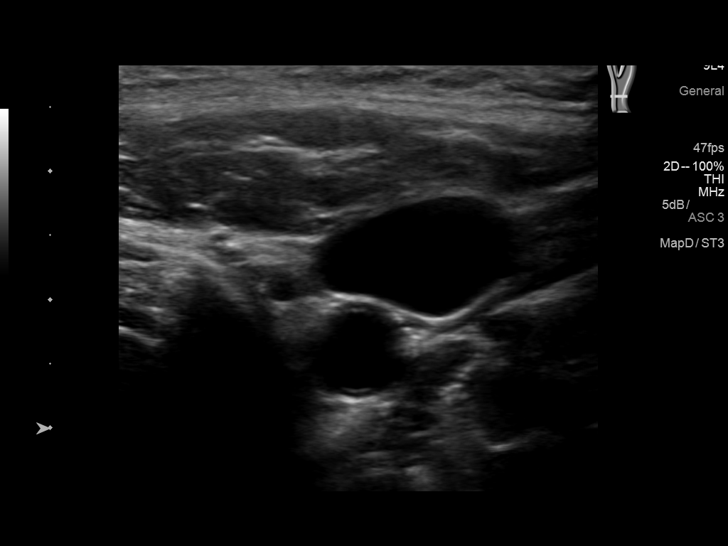
[im 46/70]
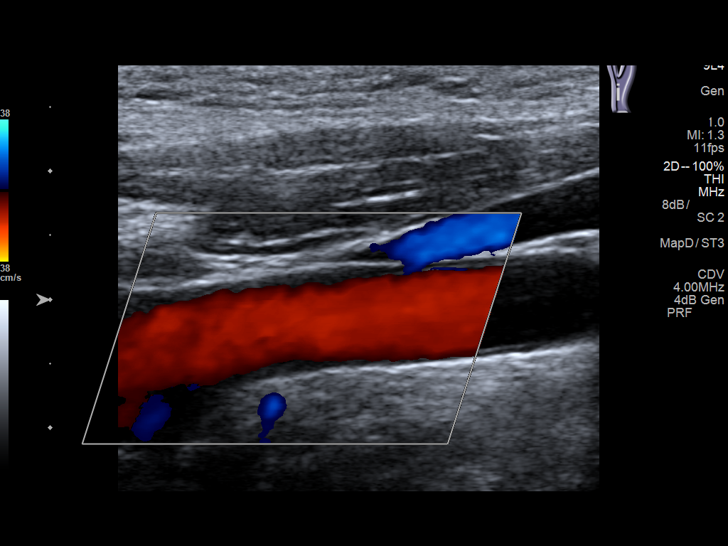
[im 52/70]
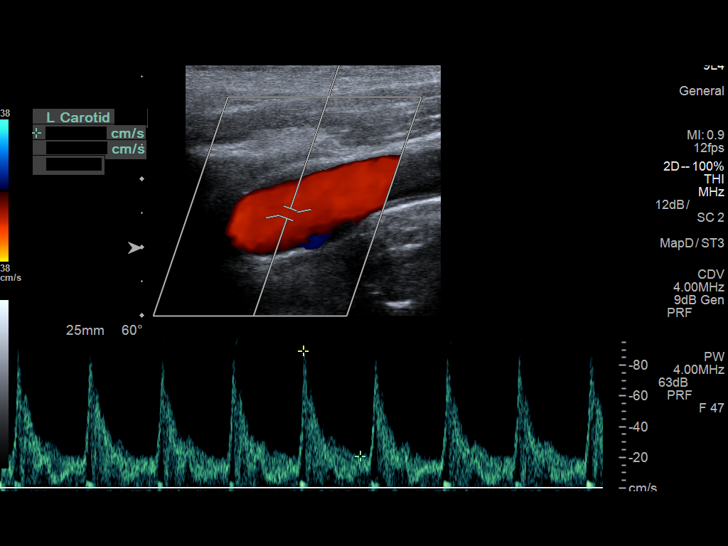
[im 58/70]
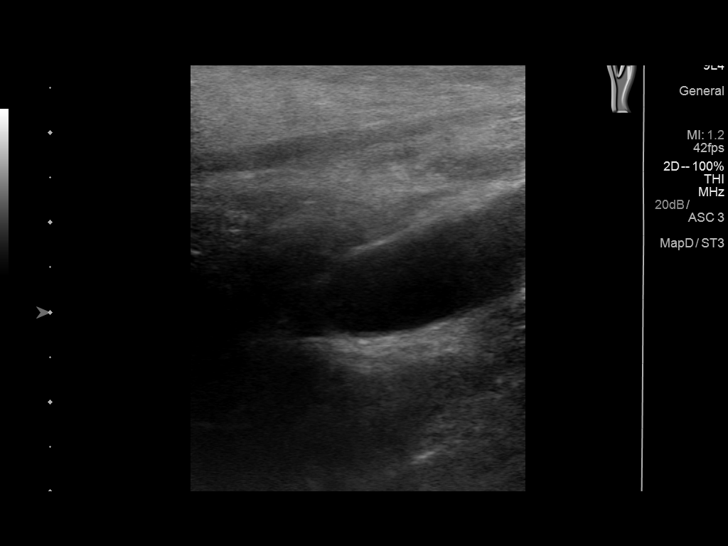
[im 64/70]
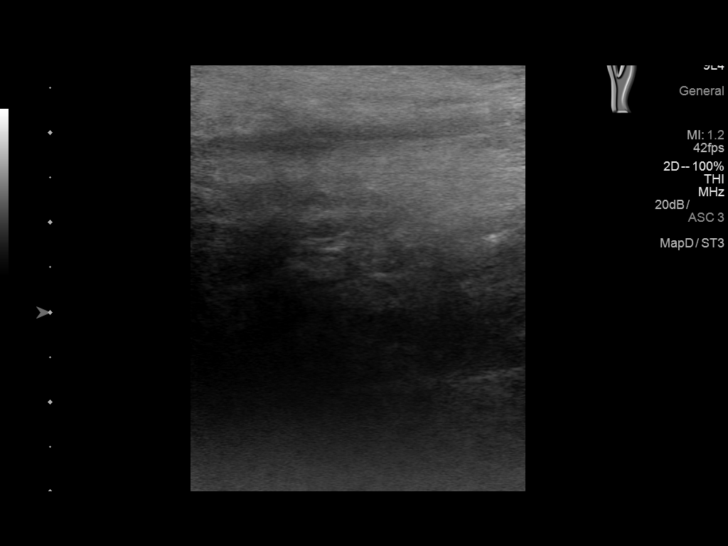
[im 70/70]
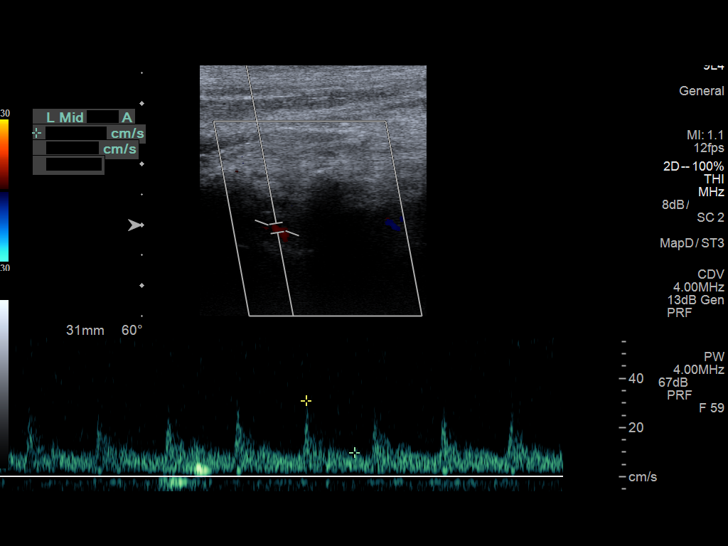

[13 of 24 positions shown; findings below may reference images not displayed]

FINDINGS: Criteria: Quantification of carotid stenosis is based on velocity
parameters that correlate the residual internal carotid diameter
with NASCET-based stenosis levels, using the diameter of the distal
internal carotid lumen as the denominator for stenosis measurement.

The following velocity measurements were obtained:

RIGHT
ICA: 71/25 cm/sec
CCA: 88/21 cm/sec

SYSTOLIC ICA/CCA RATIO:

ECA:  94 cm/sec

LEFT

ICA: 62/23 cm/sec

CCA: 90/20 cm/sec

SYSTOLIC ICA/CCA RATIO:

ECA:  78 cm/sec

RIGHT CAROTID ARTERY: No significant atherosclerotic plaque or
evidence of stenosis.

RIGHT VERTEBRAL ARTERY:  Patent with normal antegrade flow.

LEFT CAROTID ARTERY: No significant atherosclerotic plaque or
evidence of stenosis.

LEFT VERTEBRAL ARTERY:  Patent with normal antegrade flow.
IMPRESSION: Negative examination. No evidence of significant atherosclerotic
plaque or stenosis in either internal carotid artery.

## 2019-07-11 ENCOUNTER — Other Ambulatory Visit: Payer: Self-pay

## 2019-07-11 ENCOUNTER — Ambulatory Visit
Admission: RE | Admit: 2019-07-11 | Discharge: 2019-07-11 | Disposition: A | Discharge status: Home / Self Care | Payer: Medicare HMO | Source: Ambulatory Visit | Attending: Internal Medicine | Admitting: Internal Medicine

## 2019-07-11 DIAGNOSIS — C8203 Follicular lymphoma grade I, intra-abdominal lymph nodes: Secondary | ICD-10-CM | POA: Insufficient documentation

## 2019-07-11 LAB — POCT I-STAT CREATININE: Creatinine, Ser: 0.9 mg/dL (ref 0.61–1.24)

## 2019-07-11 MED ORDER — IOHEXOL 300 MG/ML  SOLN
100.0000 mL | Freq: Once | INTRAMUSCULAR | Status: AC | PRN
  Administered 2019-07-11: 100 mL via INTRAVENOUS

## 2019-07-12 ENCOUNTER — Inpatient Hospital Stay: Payer: Medicare HMO | Attending: Internal Medicine

## 2019-07-12 ENCOUNTER — Encounter: Payer: Self-pay | Admitting: Internal Medicine

## 2019-07-12 ENCOUNTER — Inpatient Hospital Stay (HOSPITAL_BASED_OUTPATIENT_CLINIC_OR_DEPARTMENT_OTHER): Payer: Medicare HMO | Admitting: Internal Medicine

## 2019-07-12 ENCOUNTER — Other Ambulatory Visit: Payer: Self-pay

## 2019-07-12 VITALS — BP 145/90 | HR 83 | Temp 97.1°F | Resp 16 | Wt 203.8 lb

## 2019-07-12 DIAGNOSIS — C8203 Follicular lymphoma grade I, intra-abdominal lymph nodes: Secondary | ICD-10-CM | POA: Diagnosis present

## 2019-07-12 DIAGNOSIS — F329 Major depressive disorder, single episode, unspecified: Secondary | ICD-10-CM | POA: Diagnosis not present

## 2019-07-12 DIAGNOSIS — I10 Essential (primary) hypertension: Secondary | ICD-10-CM | POA: Diagnosis not present

## 2019-07-12 DIAGNOSIS — Z79899 Other long term (current) drug therapy: Secondary | ICD-10-CM | POA: Insufficient documentation

## 2019-07-12 DIAGNOSIS — Z923 Personal history of irradiation: Secondary | ICD-10-CM | POA: Diagnosis not present

## 2019-07-12 DIAGNOSIS — N400 Enlarged prostate without lower urinary tract symptoms: Secondary | ICD-10-CM | POA: Insufficient documentation

## 2019-07-12 DIAGNOSIS — Z7982 Long term (current) use of aspirin: Secondary | ICD-10-CM | POA: Diagnosis not present

## 2019-07-12 DIAGNOSIS — Z9221 Personal history of antineoplastic chemotherapy: Secondary | ICD-10-CM | POA: Diagnosis not present

## 2019-07-12 LAB — COMPREHENSIVE METABOLIC PANEL WITH GFR
ALT: 19 U/L (ref 0–44)
AST: 23 U/L (ref 15–41)
Albumin: 3.9 g/dL (ref 3.5–5.0)
Alkaline Phosphatase: 64 U/L (ref 38–126)
Anion gap: 9 (ref 5–15)
BUN: 10 mg/dL (ref 8–23)
CO2: 25 mmol/L (ref 22–32)
Calcium: 9.1 mg/dL (ref 8.9–10.3)
Chloride: 106 mmol/L (ref 98–111)
Creatinine, Ser: 0.89 mg/dL (ref 0.61–1.24)
GFR calc Af Amer: >60 mL/min
GFR calc non Af Amer: >60 mL/min
Glucose, Bld: 147 mg/dL — ABNORMAL HIGH (ref 70–99)
Potassium: 3.6 mmol/L (ref 3.5–5.1)
Sodium: 140 mmol/L (ref 135–145)
Total Bilirubin: 0.8 mg/dL (ref 0.3–1.2)
Total Protein: 6.8 g/dL (ref 6.5–8.1)

## 2019-07-12 LAB — CBC WITH DIFFERENTIAL/PLATELET
Abs Immature Granulocytes: 0.05 10*3/uL (ref 0.00–0.07)
Basophils Absolute: 0.1 10*3/uL (ref 0.0–0.1)
Basophils Relative: 1 %
Eosinophils Absolute: 0.1 10*3/uL (ref 0.0–0.5)
Eosinophils Relative: 2 %
HCT: 43.7 % (ref 39.0–52.0)
Hemoglobin: 14.7 g/dL (ref 13.0–17.0)
Immature Granulocytes: 1 %
Lymphocytes Relative: 19 %
Lymphs Abs: 0.9 10*3/uL (ref 0.7–4.0)
MCH: 28.5 pg (ref 26.0–34.0)
MCHC: 33.6 g/dL (ref 30.0–36.0)
MCV: 84.9 fL (ref 80.0–100.0)
Monocytes Absolute: 0.5 10*3/uL (ref 0.1–1.0)
Monocytes Relative: 10 %
Neutro Abs: 3.3 10*3/uL (ref 1.7–7.7)
Neutrophils Relative %: 67 %
Platelets: 235 10*3/uL (ref 150–400)
RBC: 5.15 MIL/uL (ref 4.22–5.81)
RDW: 15 % (ref 11.5–15.5)
WBC: 4.9 10*3/uL (ref 4.0–10.5)
nRBC: 0 % (ref 0.0–0.2)

## 2019-07-12 LAB — LACTATE DEHYDROGENASE: LDH: 137 U/L (ref 98–192)

## 2019-07-12 NOTE — Assessment & Plan Note (Addendum)
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  Currently on surveillance.  AUG 17th 2020- CT scan - increasing size of ilio-colic LN [largest 1.8 cm]; stable RP LN; slightly increasing size of the exophytic lesion of spleen [~2.5 cm].  #I would recommend evaluation with a PET scan given the slight progression of the lymphadenopathy.  Discussed that I would still recommend surveillance if there is no significant progression.  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  Stable.  # DISPOSITION: # PET in 1 week # follow up in 1-2 days after the PET scan; no labs- Dr.B  # 25 minutes face-to-face with the patient discussing the above plan of care; more than 50% of time spent on prognosis/ natural history; counseling and coordination.  # I reviewed the blood work- with the patient in detail; also reviewed the imaging independently [as summarized above]; and with the patient in detail.

## 2019-07-12 NOTE — Progress Notes (Signed)
[place] OFFICE PROGRESS NOTE  Patient Care Team: Rusty Aus, MD as PCP - General (Internal Medicine) Hubbard Robinson, MD as Consulting Physician (Surgery)  Cancer Staging No matching staging information was found for the patient.   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 Recm surviellance; Dr.Crystal. 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   Patient states he and his wife were tested positive for COVID 19.  Unfortunately he is lost his wife to [place] 90 pneumonia end of July.  Patient has been quite depressed given the loss of his wife.  However he is coping up fairly well.   He complains of poor appetite.  Lost about 13 pounds in the last few months.  Otherwise denies any abdominal pain nausea vomiting.  No night sweats.  Chronic mild fatigue.  Review of Systems  Constitutional:  Positive for malaise/fatigue. Negative for chills, diaphoresis, fever and weight loss.  HENT: Negative for nosebleeds and sore throat.   Eyes: Negative for double vision.  Respiratory: Negative for cough, hemoptysis, sputum production, shortness of breath and wheezing.   Cardiovascular: Negative for chest pain, palpitations, orthopnea and leg swelling.  Gastrointestinal: Negative for abdominal pain, blood in stool, constipation, diarrhea, heartburn, melena and vomiting.       Abdominal distention/ventral hernia.  Genitourinary: Negative for dysuria, frequency and urgency.  Musculoskeletal: Negative for back pain and joint pain.  Skin: Negative.  Negative for itching and rash.  Neurological: Negative for dizziness, tingling, focal weakness, weakness and headaches.  Endo/Heme/Allergies: Does not bruise/bleed easily.  Psychiatric/Behavioral: Negative for depression. The patient is not nervous/anxious and does not have insomnia.       PAST MEDICAL HISTORY :  Past Medical History:  Diagnosis Date  . Follicular lymphoma ([place]) 2014   Chemo tx's.  Marland Kitchen GERD (gastroesophageal reflux disease)   . Hypertension     PAST SURGICAL HISTORY :   Past Surgical History:  Procedure Laterality Date  . BOWEL RESECTION N/A 06/19/2016   Procedure: SMALL BOWEL RESECTION;  Surgeon: Hubbard Robinson, MD;  Location: [facility] ORS;  Service: General;  Laterality: N/A;  . CHOLECYSTECTOMY    . LAPAROSCOPIC REMOVAL OF MESENTERIC MASS N/A 06/19/2016   Procedure: LAPAROSCOPIC REMOVAL OF MESENTERIC MASS;  Surgeon: Hubbard Robinson, MD;  Location: [facility] ORS;  Service: General;  Laterality: N/A;  . LYMPH NODE DISSECTION    . PORT-A-CATH REMOVAL    . PORTACATH PLACEMENT  FAMILY HISTORY :   Family History  Problem Relation Age of Onset  . Clotting disorder Mother   . Lymphoma Father   . Hiatal hernia Father   . Heart disease Father        stent placed  . Cancer Paternal Uncle        esophagus  . Brain cancer  Maternal Aunt     SOCIAL HISTORY:   Social History   Tobacco Use  . Smoking status: Never Smoker  . Smokeless tobacco: Never Used  Substance Use Topics  . Alcohol use: No  . Drug use: No    ALLERGIES:  has No Known Allergies.  MEDICATIONS:  Current Outpatient Medications  Medication Sig Dispense Refill  . aspirin 81 MG chewable tablet Chew 1 tablet by mouth daily.    Marland Kitchen azithromycin (ZITHROMAX) 250 MG tablet Take 1 tablet daily by mouth 4 tablet 0  . cetirizine (ZYRTEC) 10 MG tablet Take 1 tablet by mouth daily.    . fluticasone (FLONASE) 50 MCG/ACT nasal spray Place 1 spray into both nostrils daily as needed for allergies or rhinitis.    Marland Kitchen losartan-hydrochlorothiazide (HYZAAR) 50-12.5 MG per tablet TAKE 1 TABLET DAILY.    Marland Kitchen MELATONIN PO Take 2 mg by mouth at bedtime as needed.     . Multiple Vitamin (MULTIVITAMIN WITH MINERALS) TABS tablet Take 1 tablet by mouth daily.    . ondansetron (ZOFRAN ODT) 4 MG disintegrating tablet Take 1 tablet (4 mg total) by mouth every 6 (six) hours as needed for nausea or vomiting. 20 tablet 0  . pantoprazole (PROTONIX) 40 MG tablet Take 40 mg by mouth daily.     . polyethylene glycol (MIRALAX / GLYCOLAX) packet Take 17 g by mouth daily.    . Probiotic Product (PHILLIPS COLON HEALTH PO) Take 1 capsule by mouth daily.    . ranitidine (ZANTAC) 150 MG tablet Take 150 mg by mouth at bedtime as needed for heartburn.     . simvastatin (ZOCOR) 20 MG tablet Take 1 tablet by mouth daily.     No current facility-administered medications for this visit.     PHYSICAL EXAMINATION: ECOG PERFORMANCE STATUS: 1 - Symptomatic but completely ambulatory  BP (!) 145/90 (BP Location: Left Arm, Patient Position: Sitting, Cuff Size: Normal)   Pulse 83   Temp (!) 97.1 F (36.2 C) (Tympanic)   Resp 16   Wt 203 lb 12.8 oz (92.4 kg)   BMI 26.89 kg/m   Filed Weights   07/12/19 1044  Weight: 203 lb 12.8 oz (92.4 kg)   Physical Exam  Constitutional: He is  oriented to person, place, and time and well-developed, well-nourished, and in no distress.  HENT:  Head: Normocephalic and atraumatic.  Mouth/Throat: Oropharynx is clear and moist. No oropharyngeal exudate.  Eyes: Pupils are equal, round, and reactive to light.  Neck: Normal range of motion. Neck supple.  Cardiovascular: Normal rate and regular rhythm.  Pulmonary/Chest: No respiratory distress. He has no wheezes.  Abdominal: Soft. Bowel sounds are normal. He exhibits no distension and no mass. There is no abdominal tenderness. There is no rebound and no guarding.  Midabdominal incision noted.  Well-healed.  Musculoskeletal: Normal range of motion.        General: No tenderness or edema.  Neurological: He is alert and oriented to person, place, and time.  Skin: Skin is warm.  Psychiatric: Affect normal.      LABORATORY DATA:  I have reviewed the data as listed  Component Value Date/Time   NA 140 07/12/2019 1021   NA 140 03/12/2015 1007   K 3.6 07/12/2019 1021   K 3.7 03/12/2015 1007   CL 106 07/12/2019 1021   CL 105 03/12/2015 1007   CO2 25 07/12/2019 1021   CO2 29 03/12/2015 1007   GLUCOSE 147 (H) 07/12/2019 1021   GLUCOSE 143 (H) 03/12/2015 1007   BUN 10 07/12/2019 1021   BUN 15 03/12/2015 1007   CREATININE 0.89 07/12/2019 1021   CREATININE 0.92 03/12/2015 1007   CALCIUM 9.1 07/12/2019 1021   CALCIUM 8.6 (L) 03/12/2015 1007   PROT 6.8 07/12/2019 1021   PROT 6.6 03/12/2015 1007   ALBUMIN 3.9 07/12/2019 1021   ALBUMIN 4.0 03/12/2015 1007   AST 23 07/12/2019 1021   AST 26 03/12/2015 1007   ALT 19 07/12/2019 1021   ALT 37 03/12/2015 1007   ALKPHOS 64 07/12/2019 1021   ALKPHOS 79 03/12/2015 1007   BILITOT 0.8 07/12/2019 1021   BILITOT 0.8 03/12/2015 1007   GFRNONAA >60 07/12/2019 1021   GFRNONAA >60 03/12/2015 1007   GFRAA >60 07/12/2019 1021   GFRAA >60 03/12/2015 1007    No results found for: SPEP, UPEP  Lab Results  Component Value Date   WBC 4.9  07/12/2019   NEUTROABS 3.3 07/12/2019   HGB 14.7 07/12/2019   HCT 43.7 07/12/2019   MCV 84.9 07/12/2019   PLT 235 07/12/2019      Chemistry      Component Value Date/Time   NA 140 07/12/2019 1021   NA 140 03/12/2015 1007   K 3.6 07/12/2019 1021   K 3.7 03/12/2015 1007   CL 106 07/12/2019 1021   CL 105 03/12/2015 1007   CO2 25 07/12/2019 1021   CO2 29 03/12/2015 1007   BUN 10 07/12/2019 1021   BUN 15 03/12/2015 1007   CREATININE 0.89 07/12/2019 1021   CREATININE 0.92 03/12/2015 1007      Component Value Date/Time   CALCIUM 9.1 07/12/2019 1021   CALCIUM 8.6 (L) 03/12/2015 1007   ALKPHOS 64 07/12/2019 1021   ALKPHOS 79 03/12/2015 1007   AST 23 07/12/2019 1021   AST 26 03/12/2015 1007   ALT 19 07/12/2019 1021   ALT 37 03/12/2015 1007   BILITOT 0.8 07/12/2019 1021   BILITOT 0.8 03/12/2015 1007       RADIOGRAPHIC STUDIES: I have personally reviewed the radiological images as listed and agreed with the findings in the report. Ct Abdomen Pelvis W Contrast  Result Date: 07/11/2019 CLINICAL DATA:  Follicular lymphoma restaging. EXAM: CT ABDOMEN AND PELVIS WITH CONTRAST TECHNIQUE: Multidetector CT imaging of the abdomen and pelvis was performed using the standard protocol following bolus administration of intravenous contrast. CONTRAST:  127mL OMNIPAQUE IOHEXOL 300 MG/ML  SOLN COMPARISON:  Multiple exams, including 06/08/2018 FINDINGS: Lower chest: Small to moderate-sized type 1 hiatal hernia. Hepatobiliary: Cholecystectomy.  Otherwise unremarkable. Pancreas: Unremarkable Spleen: Hypodense partially exophytic lesion of the spleen measures 2.7 by 1.9 cm, previously 1.9 by 1.4 cm. No splenomegaly. Adrenals/Urinary Tract: Both adrenal glands appear normal. Stable fluid density left renal cysts. There are least 3 punctate nonobstructive right renal calculi measuring up to 0.2 cm in diameter. There are 5 nonobstructive left renal calculi measuring up to 0.6 cm in diameter. No ureteral or  bladder calculus is identified. Stomach/Bowel: Small periampullary duodenal diverticulum. Vascular/Lymphatic: Aortoiliac atherosclerotic vascular disease. A right ileocolic lymph node measures 1.8 cm in short axis on image 44/2, previously 1.4 cm. A separate  right ileocolic node measures 1.6 cm in short axis on image 43/2, formerly 1.2 cm. These have changed orientation slightly compared to the prior exam due to shifting in the bowel loops. A lymph node just to the left of the SMV measures 0.8 cm in short axis, stable. A left periaortic lymph node measures 1.1 cm in short axis on image 36/2, previously 1.1 cm. Reproductive: Mild prostatomegaly. Other: No supplemental non-categorized findings. Musculoskeletal: Small bilateral indirect inguinal hernias contain adipose tissue. Grade 1 degenerative anterolisthesis of L5 on S1. IMPRESSION: 1. Two mildly enlarged ileocolic lymph nodes are slightly increased in size compared to the prior exam. There is also a mildly enlarged left periaortic lymph node which is stable. 2. No splenomegaly. There is a hypodense exophytic lesion of the spleen potentially some form of cystic lesion, which is mildly enlarged compared to the 06/08/2018 exam and merit surveillance. 3. Other imaging findings of potential clinical significance: Small to moderate-sized type 1 hiatal hernia. Bilateral nonobstructive nephrolithiasis. Aortic Atherosclerosis (ICD10-I70.0). Mild prostatomegaly. Small bilateral indirect inguinal hernias contain adipose tissue. Electronically Signed   By: Van Clines M.D.   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  Currently on surveillance.  AUG 17th 2020- CT scan - increasing size of ilio-colic LN [largest 1.8 cm]; stable RP LN; slightly increasing size of the exophytic lesion of spleen [~2.5 cm].  #I would recommend  evaluation with a PET scan given the slight progression of the lymphadenopathy.  Discussed that I would still recommend surveillance if there is no significant progression.  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  Stable.  # DISPOSITION: # PET in 1 week # follow up in 1-2 days after the PET scan; no labs- Dr.B  # 25 minutes face-to-face with the patient discussing the above plan of care; more than 50% of time spent on prognosis/ natural history; counseling and coordination.  # I reviewed the blood work- with the patient in detail; also reviewed the imaging independently [as summarized above]; and with the patient in detail.       Orders Placed This Encounter  Procedures  . NM PET Image Restag (PS) Skull Base To Thigh    Standing Status:   Future    Standing Expiration Date:   07/11/2020    Order Specific Question:   ** REASON FOR EXAM (FREE TEXT)    Answer:   follicular lymphoma; DLBCL    Order Specific Question:   If indicated for the ordered procedure, I authorize the administration of a radiopharmaceutical per Radiology protocol    Answer:   Yes    Order Specific Question:   Preferred imaging location?    Answer:   [facility]    Order Specific Question:   Radiology Contrast Protocol - do NOT remove file path    Answer:   \\[email or web address]   All questions were answered. The patient knows to call the clinic with any problems, questions or concerns.      Cammie Sickle, MD 07/12/2019 3:01 PM

## 2019-07-19 ENCOUNTER — Other Ambulatory Visit: Payer: Self-pay

## 2019-07-19 ENCOUNTER — Ambulatory Visit
Admission: RE | Admit: 2019-07-19 | Discharge: 2019-07-19 | Disposition: A | Discharge status: Home / Self Care | Payer: Medicare HMO | Source: Ambulatory Visit | Attending: Internal Medicine | Admitting: Internal Medicine

## 2019-07-19 DIAGNOSIS — N200 Calculus of kidney: Secondary | ICD-10-CM | POA: Insufficient documentation

## 2019-07-19 DIAGNOSIS — I700 Atherosclerosis of aorta: Secondary | ICD-10-CM | POA: Diagnosis not present

## 2019-07-19 DIAGNOSIS — K449 Diaphragmatic hernia without obstruction or gangrene: Secondary | ICD-10-CM | POA: Diagnosis not present

## 2019-07-19 DIAGNOSIS — I10 Essential (primary) hypertension: Secondary | ICD-10-CM | POA: Insufficient documentation

## 2019-07-19 DIAGNOSIS — K219 Gastro-esophageal reflux disease without esophagitis: Secondary | ICD-10-CM | POA: Diagnosis not present

## 2019-07-19 DIAGNOSIS — N400 Enlarged prostate without lower urinary tract symptoms: Secondary | ICD-10-CM | POA: Diagnosis not present

## 2019-07-19 DIAGNOSIS — Z79899 Other long term (current) drug therapy: Secondary | ICD-10-CM | POA: Insufficient documentation

## 2019-07-19 DIAGNOSIS — C8203 Follicular lymphoma grade I, intra-abdominal lymph nodes: Secondary | ICD-10-CM | POA: Diagnosis present

## 2019-07-19 LAB — GLUCOSE, CAPILLARY: Glucose-Capillary: 88 mg/dL (ref 70–99)

## 2019-07-19 MED ORDER — FLUDEOXYGLUCOSE F - 18 (FDG) INJECTION
10.5000 | Freq: Once | INTRAVENOUS | Status: AC | PRN
  Administered 2019-07-19: 10.32 via INTRAVENOUS

## 2019-07-21 ENCOUNTER — Other Ambulatory Visit: Payer: Self-pay

## 2019-07-21 ENCOUNTER — Encounter: Payer: Self-pay | Admitting: Internal Medicine

## 2019-07-21 ENCOUNTER — Inpatient Hospital Stay (HOSPITAL_BASED_OUTPATIENT_CLINIC_OR_DEPARTMENT_OTHER): Payer: Medicare HMO | Admitting: Internal Medicine

## 2019-07-21 VITALS — BP 148/88 | HR 80 | Temp 96.9°F | Resp 20 | Ht 73.0 in | Wt 205.0 lb

## 2019-07-21 DIAGNOSIS — C8203 Follicular lymphoma grade I, intra-abdominal lymph nodes: Secondary | ICD-10-CM | POA: Diagnosis not present

## 2019-07-21 NOTE — Progress Notes (Signed)
[facility] OFFICE PROGRESS NOTE  Patient Care Team: Rusty Aus, MD as PCP - General (Internal Medicine) Hubbard Robinson, MD as Consulting Physician (Surgery)  Cancer Staging No matching staging information was found for the patient.   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 Recm surviellance; Dr.Crystal.  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  However he seems to be coping fairly well.  He states his appetite is fair.  No nausea no vomiting pain abdominal pain.  Chronic mild to moderate fatigue.  Not any worse.  No night sweats.  Review of Systems  Constitutional: Positive for malaise/fatigue. Negative for  chills, diaphoresis, fever and weight loss.  HENT: Negative for nosebleeds and sore throat.   Eyes: Negative for double vision.  Respiratory: Negative for cough, hemoptysis, sputum production, shortness of breath and wheezing.   Cardiovascular: Negative for chest pain, palpitations, orthopnea and leg swelling.  Gastrointestinal: Negative for abdominal pain, blood in stool, constipation, diarrhea, heartburn, melena and vomiting.       Abdominal distention/ventral hernia.  Genitourinary: Negative for dysuria, frequency and urgency.  Musculoskeletal: Negative for back pain and joint pain.  Skin: Negative.  Negative for itching and rash.  Neurological: Negative for dizziness, tingling, focal weakness, weakness and headaches.  Endo/Heme/Allergies: Does not bruise/bleed easily.  Psychiatric/Behavioral: Negative for depression. The patient is not nervous/anxious and does not have insomnia.       PAST MEDICAL HISTORY :  Past Medical History:  Diagnosis Date  . Follicular lymphoma ([place]) 2014   Chemo tx's.  Marland Kitchen GERD (gastroesophageal reflux disease)   . Hypertension     PAST SURGICAL HISTORY :   Past Surgical History:  Procedure Laterality Date  . BOWEL RESECTION N/A 06/19/2016   Procedure: SMALL BOWEL RESECTION;  Surgeon: Hubbard Robinson, MD;  Location: [facility] ORS;  Service: General;  Laterality: N/A;  . CHOLECYSTECTOMY    . LAPAROSCOPIC REMOVAL OF MESENTERIC MASS N/A 06/19/2016   Procedure: LAPAROSCOPIC REMOVAL OF MESENTERIC MASS;  Surgeon: Hubbard Robinson, MD;  Location: [facility] ORS;  Service: General;  Laterality: N/A;  . LYMPH NODE DISSECTION    . PORT-A-CATH REMOVAL    . PORTACATH PLACEMENT      FAMILY HISTORY :   Family History  Problem Relation Age of Onset  . Clotting disorder Mother   .  Lymphoma Father   . Hiatal hernia Father   . Heart disease Father        stent placed  . Cancer Paternal Uncle        esophagus  . Brain cancer Maternal Aunt     SOCIAL HISTORY:    Social History   Tobacco Use  . Smoking status: Never Smoker  . Smokeless tobacco: Never Used  Substance Use Topics  . Alcohol use: No  . Drug use: No    ALLERGIES:  has No Known Allergies.  MEDICATIONS:  Current Outpatient Medications  Medication Sig Dispense Refill  . aspirin 81 MG chewable tablet Chew 1 tablet by mouth daily.    . cetirizine (ZYRTEC) 10 MG tablet Take 1 tablet by mouth daily.    . fluticasone (FLONASE) 50 MCG/ACT nasal spray Place 1 spray into both nostrils daily as needed for allergies or rhinitis.    Marland Kitchen losartan-hydrochlorothiazide (HYZAAR) 50-12.5 MG per tablet TAKE 1 TABLET DAILY.    Marland Kitchen MELATONIN PO Take 2 mg by mouth at bedtime as needed.     . Multiple Vitamin (MULTIVITAMIN WITH MINERALS) TABS tablet Take 1 tablet by mouth daily.    . pantoprazole (PROTONIX) 40 MG tablet Take 40 mg by mouth daily.     . polyethylene glycol (MIRALAX / GLYCOLAX) packet Take 17 g by mouth daily.    . Probiotic Product (PHILLIPS COLON HEALTH PO) Take 1 capsule by mouth daily.    . ranitidine (ZANTAC) 150 MG tablet Take 150 mg by mouth at bedtime as needed for heartburn.     . simvastatin (ZOCOR) 20 MG tablet Take 1 tablet by mouth daily.    . ondansetron (ZOFRAN ODT) 4 MG disintegrating tablet Take 1 tablet (4 mg total) by mouth every 6 (six) hours as needed for nausea or vomiting. (Patient not taking: Reported on 07/21/2019) 20 tablet 0   No current facility-administered medications for this visit.     PHYSICAL EXAMINATION: ECOG PERFORMANCE STATUS: 1 - Symptomatic but completely ambulatory  BP (!) 148/88   Pulse 80   Temp (!) 96.9 F (36.1 C) (Tympanic)   Resp 20   Ht 6\' 1"  (1.854 m)   Wt 205 lb (93 kg)   BMI 27.05 kg/m   Filed Weights   07/21/19 1006  Weight: 205 lb (93 kg)   Physical Exam  Constitutional: He is oriented to person, place, and time and well-developed, well-nourished, and in no distress.  HENT:  Head: Normocephalic and atraumatic.   Mouth/Throat: Oropharynx is clear and moist. No oropharyngeal exudate.  Eyes: Pupils are equal, round, and reactive to light.  Neck: Normal range of motion. Neck supple.  Cardiovascular: Normal rate and regular rhythm.  Pulmonary/Chest: No respiratory distress. He has no wheezes.  Abdominal: Soft. Bowel sounds are normal. He exhibits no distension and no mass. There is no abdominal tenderness. There is no rebound and no guarding.  Midabdominal incision noted.  Well-healed.  Musculoskeletal: Normal range of motion.        General: No tenderness or edema.  Neurological: He is alert and oriented to person, place, and time.  Skin: Skin is warm.  Psychiatric: Affect normal.      LABORATORY DATA:  I have reviewed the data as listed    Component Value Date/Time   NA 140 07/12/2019 1021   NA 140 03/12/2015 1007   K 3.6 07/12/2019 1021   K 3.7 03/12/2015 1007   CL 106 07/12/2019 1021   CL 105  03/12/2015 1007   CO2 25 07/12/2019 1021   CO2 29 03/12/2015 1007   GLUCOSE 147 (H) 07/12/2019 1021   GLUCOSE 143 (H) 03/12/2015 1007   BUN 10 07/12/2019 1021   BUN 15 03/12/2015 1007   CREATININE 0.89 07/12/2019 1021   CREATININE 0.92 03/12/2015 1007   CALCIUM 9.1 07/12/2019 1021   CALCIUM 8.6 (L) 03/12/2015 1007   PROT 6.8 07/12/2019 1021   PROT 6.6 03/12/2015 1007   ALBUMIN 3.9 07/12/2019 1021   ALBUMIN 4.0 03/12/2015 1007   AST 23 07/12/2019 1021   AST 26 03/12/2015 1007   ALT 19 07/12/2019 1021   ALT 37 03/12/2015 1007   ALKPHOS 64 07/12/2019 1021   ALKPHOS 79 03/12/2015 1007   BILITOT 0.8 07/12/2019 1021   BILITOT 0.8 03/12/2015 1007   GFRNONAA >60 07/12/2019 1021   GFRNONAA >60 03/12/2015 1007   GFRAA >60 07/12/2019 1021   GFRAA >60 03/12/2015 1007    No results found for: SPEP, UPEP  Lab Results  Component Value Date   WBC 4.9 07/12/2019   NEUTROABS 3.3 07/12/2019   HGB 14.7 07/12/2019   HCT 43.7 07/12/2019   MCV 84.9 07/12/2019   PLT 235 07/12/2019       Chemistry      Component Value Date/Time   NA 140 07/12/2019 1021   NA 140 03/12/2015 1007   K 3.6 07/12/2019 1021   K 3.7 03/12/2015 1007   CL 106 07/12/2019 1021   CL 105 03/12/2015 1007   CO2 25 07/12/2019 1021   CO2 29 03/12/2015 1007   BUN 10 07/12/2019 1021   BUN 15 03/12/2015 1007   CREATININE 0.89 07/12/2019 1021   CREATININE 0.92 03/12/2015 1007      Component Value Date/Time   CALCIUM 9.1 07/12/2019 1021   CALCIUM 8.6 (L) 03/12/2015 1007   ALKPHOS 64 07/12/2019 1021   ALKPHOS 79 03/12/2015 1007   AST 23 07/12/2019 1021   AST 26 03/12/2015 1007   ALT 19 07/12/2019 1021   ALT 37 03/12/2015 1007   BILITOT 0.8 07/12/2019 1021   BILITOT 0.8 03/12/2015 1007       RADIOGRAPHIC STUDIES: I have personally reviewed the radiological images as listed and agreed with the findings in the report. No results found.   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  Currently on surveillance.  AUG 17th 2020- CT scan - increasing size of ilio-colic LN [largest 1.8 cm]; stable RP LN; slightly increasing size of the exophytic lesion of spleen [~2.5 cm]; patient continues to be clinically asymptomatic.  #Patient likely has mild progression of his follicular lymphoma-based upon the PET scan/SUV uptake.  I am not suspicious of recurrence of his diffuse large B-cell lymphoma.   #Currently patient continues to be clinically asymptomatic/COVID pandemic-I think is reasonable to continue surveillance.  Discussed new treatment options available for the patient if and when patient needs therapies.  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  Stable.  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  Recommend follow-up with PCP.  # DISPOSITION: # follow up in 6 months; MD- labs- CBC/MP/LDh- CT A/P prior- Dr.B  # 25 minutes face-to-face with the patient discussing the  above plan of care; more than 50% of time spent on prognosis/ natural history; counseling and coordination.    Orders Placed This Encounter  Procedures  . CT Abdomen Pelvis W  Contrast    Standing Status:   Future    Standing Expiration Date:   07/20/2020    Order Specific Question:   ** REASON FOR EXAM (FREE TEXT)    Answer:   lymphoma    Order Specific Question:   If indicated for the ordered procedure, I authorize the administration of contrast media per Radiology protocol    Answer:   Yes    Order Specific Question:   Preferred imaging location?    Answer:   [facility]    Order Specific Question:   Is Oral Contrast requested for this exam?    Answer:   Yes, Per Radiology protocol    Order Specific Question:   Radiology Contrast Protocol - do NOT remove file path    Answer:   \\[email or web address]  . CBC with Differential    Standing Status:   Future    Standing Expiration Date:   07/20/2020  . Comprehensive metabolic panel    Standing Status:   Future    Standing Expiration Date:   07/20/2020  . Lactate dehydrogenase    Standing Status:   Future    Standing Expiration Date:   07/20/2020   All questions were answered. The patient knows to call the clinic with any problems, questions or concerns.      Cammie Sickle, MD 07/25/2019 1:08 PM

## 2019-07-21 NOTE — Assessment & Plan Note (Addendum)
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  Currently on surveillance.  AUG 17th 2020- CT scan - increasing size of ilio-colic LN [largest 1.8 cm]; stable RP LN; slightly increasing size of the exophytic lesion of spleen [~2.5 cm]; patient continues to be clinically asymptomatic.  #Patient likely has mild progression of his follicular lymphoma-based upon the PET scan/SUV uptake.  I am not suspicious of recurrence of his diffuse large B-cell lymphoma.   #Currently patient continues to be clinically asymptomatic/COVID pandemic-I think is reasonable to continue surveillance.  Discussed new treatment options available for the patient if and when patient needs therapies.  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  Stable.  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  Recommend follow-up with PCP.  # DISPOSITION: # follow up in 6 months; MD- labs- CBC/MP/LDh- CT A/P prior- Dr.B  # 25 minutes face-to-face with the patient discussing the above plan of care; more than 50% of time spent on prognosis/ natural history; counseling and coordination.

## 2020-01-14 ENCOUNTER — Ambulatory Visit: Payer: Medicare HMO | Attending: Internal Medicine

## 2020-01-14 DIAGNOSIS — Z23 Encounter for immunization: Secondary | ICD-10-CM

## 2020-01-14 NOTE — Progress Notes (Signed)
   Covid-19 Vaccination Clinic  Name:  Troy Reynolds    MRN: [id number] DOB: [birth year]  01/14/2020  Troy Reynolds was observed post Covid-19 immunization for 15 minutes without incidence. He was provided with Vaccine Information Sheet and instruction to access the V-Safe system.   Troy Reynolds was instructed to call 911 with any severe reactions post vaccine: Marland Kitchen Difficulty breathing  . Swelling of your face and throat  . A fast heartbeat  . A bad rash all over your body  . Dizziness and weakness    Immunizations Administered    Name Date Dose VIS Date Route   Pfizer COVID-19 Vaccine 01/14/2020 12:00 PM 0.3 mL 11/04/2019 Intramuscular   Manufacturer: [place]   Lot: [id number]   [place]: [id number]

## 2020-01-14 NOTE — Progress Notes (Signed)
   Covid-19 Vaccination Clinic  Name:  Troy Reynolds    MRN: [id number] DOB: 1951/04/27  01/14/2020  Mr. Troy Reynolds was observed post Covid-19 immunization for 15 minutes without incidence. He was provided with Vaccine Information Sheet and instruction to access the V-Safe system.   Mr. Troy Reynolds was instructed to call 911 with any severe reactions post vaccine: Marland Kitchen Difficulty breathing  . Swelling of your face and throat  . A fast heartbeat  . A bad rash all over your body  . Dizziness and weakness    Immunizations Administered    Name Date Dose VIS Date Route   Pfizer COVID-19 Vaccine 01/14/2020 12:00 PM 0.3 mL 11/04/2019 Intramuscular   Manufacturer: [place]   Lot: [id number]   [place]: [id number]

## 2020-01-16 ENCOUNTER — Other Ambulatory Visit: Payer: Self-pay

## 2020-01-16 ENCOUNTER — Other Ambulatory Visit: Payer: Medicare HMO

## 2020-01-16 ENCOUNTER — Ambulatory Visit: Payer: Medicare HMO

## 2020-01-17 ENCOUNTER — Other Ambulatory Visit: Payer: Self-pay

## 2020-01-17 ENCOUNTER — Inpatient Hospital Stay: Payer: Medicare HMO | Attending: Internal Medicine

## 2020-01-17 ENCOUNTER — Ambulatory Visit
Admission: RE | Admit: 2020-01-17 | Discharge: 2020-01-17 | Disposition: A | Discharge status: Home / Self Care | Payer: Medicare HMO | Source: Ambulatory Visit | Attending: Internal Medicine | Admitting: Internal Medicine

## 2020-01-17 DIAGNOSIS — Z8249 Family history of ischemic heart disease and other diseases of the circulatory system: Secondary | ICD-10-CM | POA: Insufficient documentation

## 2020-01-17 DIAGNOSIS — R1030 Lower abdominal pain, unspecified: Secondary | ICD-10-CM | POA: Diagnosis not present

## 2020-01-17 DIAGNOSIS — Z9221 Personal history of antineoplastic chemotherapy: Secondary | ICD-10-CM | POA: Diagnosis not present

## 2020-01-17 DIAGNOSIS — Z923 Personal history of irradiation: Secondary | ICD-10-CM | POA: Insufficient documentation

## 2020-01-17 DIAGNOSIS — Z8572 Personal history of non-Hodgkin lymphomas: Secondary | ICD-10-CM | POA: Diagnosis present

## 2020-01-17 DIAGNOSIS — I10 Essential (primary) hypertension: Secondary | ICD-10-CM | POA: Insufficient documentation

## 2020-01-17 DIAGNOSIS — C8203 Follicular lymphoma grade I, intra-abdominal lymph nodes: Secondary | ICD-10-CM | POA: Insufficient documentation

## 2020-01-17 DIAGNOSIS — Z79899 Other long term (current) drug therapy: Secondary | ICD-10-CM | POA: Insufficient documentation

## 2020-01-17 DIAGNOSIS — Z807 Family history of other malignant neoplasms of lymphoid, hematopoietic and related tissues: Secondary | ICD-10-CM | POA: Insufficient documentation

## 2020-01-17 DIAGNOSIS — Z801 Family history of malignant neoplasm of trachea, bronchus and lung: Secondary | ICD-10-CM | POA: Insufficient documentation

## 2020-01-17 DIAGNOSIS — K219 Gastro-esophageal reflux disease without esophagitis: Secondary | ICD-10-CM | POA: Diagnosis not present

## 2020-01-17 DIAGNOSIS — Z7982 Long term (current) use of aspirin: Secondary | ICD-10-CM | POA: Diagnosis not present

## 2020-01-17 LAB — CBC WITH DIFFERENTIAL/PLATELET
Abs Immature Granulocytes: 0.04 10*3/uL (ref 0.00–0.07)
Basophils Absolute: 0.1 10*3/uL (ref 0.0–0.1)
Basophils Relative: 1 %
Eosinophils Absolute: 0.3 10*3/uL (ref 0.0–0.5)
Eosinophils Relative: 6 %
HCT: 46.1 % (ref 39.0–52.0)
Hemoglobin: 15 g/dL (ref 13.0–17.0)
Immature Granulocytes: 1 %
Lymphocytes Relative: 20 %
Lymphs Abs: 0.9 10*3/uL (ref 0.7–4.0)
MCH: 28.4 pg (ref 26.0–34.0)
MCHC: 32.5 g/dL (ref 30.0–36.0)
MCV: 87.3 fL (ref 80.0–100.0)
Monocytes Absolute: 0.8 10*3/uL (ref 0.1–1.0)
Monocytes Relative: 18 %
Neutro Abs: 2.3 10*3/uL (ref 1.7–7.7)
Neutrophils Relative %: 54 %
Platelets: 170 10*3/uL (ref 150–400)
RBC: 5.28 MIL/uL (ref 4.22–5.81)
RDW: 13.3 % (ref 11.5–15.5)
WBC: 4.3 10*3/uL (ref 4.0–10.5)
nRBC: 0 % (ref 0.0–0.2)

## 2020-01-17 LAB — COMPREHENSIVE METABOLIC PANEL
ALT: 25 U/L (ref 0–44)
AST: 23 U/L (ref 15–41)
Albumin: 3.8 g/dL (ref 3.5–5.0)
Alkaline Phosphatase: 60 U/L (ref 38–126)
Anion gap: 8 (ref 5–15)
BUN: 18 mg/dL (ref 8–23)
CO2: 27 mmol/L (ref 22–32)
Calcium: 8.7 mg/dL — ABNORMAL LOW (ref 8.9–10.3)
Chloride: 105 mmol/L (ref 98–111)
Creatinine, Ser: 0.92 mg/dL (ref 0.61–1.24)
GFR calc Af Amer: >60 mL/min (ref >60)
GFR calc non Af Amer: >60 mL/min (ref >60)
Glucose, Bld: 106 mg/dL — ABNORMAL HIGH (ref 70–99)
Potassium: 3.8 mmol/L (ref 3.5–5.1)
Sodium: 140 mmol/L (ref 135–145)
Total Bilirubin: 0.6 mg/dL (ref 0.3–1.2)
Total Protein: 6.5 g/dL (ref 6.5–8.1)

## 2020-01-17 LAB — LACTATE DEHYDROGENASE: LDH: 136 U/L (ref 98–192)

## 2020-01-17 MED ORDER — IOHEXOL 300 MG/ML  SOLN
100.0000 mL | Freq: Once | INTRAMUSCULAR | Status: AC | PRN
  Administered 2020-01-17: 09:00:00 100 mL via INTRAVENOUS

## 2020-01-18 ENCOUNTER — Other Ambulatory Visit: Payer: Self-pay

## 2020-01-19 ENCOUNTER — Other Ambulatory Visit: Payer: Medicare HMO

## 2020-01-19 ENCOUNTER — Inpatient Hospital Stay (HOSPITAL_BASED_OUTPATIENT_CLINIC_OR_DEPARTMENT_OTHER): Payer: Medicare HMO | Admitting: Internal Medicine

## 2020-01-19 ENCOUNTER — Other Ambulatory Visit: Payer: Self-pay

## 2020-01-19 DIAGNOSIS — C8203 Follicular lymphoma grade I, intra-abdominal lymph nodes: Secondary | ICD-10-CM

## 2020-01-19 DIAGNOSIS — Z8572 Personal history of non-Hodgkin lymphomas: Secondary | ICD-10-CM | POA: Diagnosis not present

## 2020-01-19 NOTE — Assessment & Plan Note (Addendum)
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  Currently on surveillance.  FEB 2021- CT ~1 to 2 cm abdominal lymph nodes noted overall stable.  Not any worse.  # Currently patient continues to be clinically asymptomatic. Plan PET; then if stable- annual imaging.   # Bilateral groin hernias-stable; no acute symptoms.  If worse defer to PCP/surgery.   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  Stable  # Thoracic AA-4.2 cm; not seen on most recent abdomen pelvis CT.  Clinically stable. Await PET as planned in 6 months.  Recommend follow-up with PCP-if worse would recommend evaluation with thoracic surgery.  # DISPOSITION: # follow up in 6 months; MD- labs- CBC/MP/LDh- PET prior [CT]- Dr.B  # I reviewed the blood work- with the patient in detail; also reviewed the imaging independently [as summarized above]; and with the patient in detail.

## 2020-01-19 NOTE — Progress Notes (Signed)
[facility] OFFICE PROGRESS NOTE  Patient Care Team: Rusty Aus, MD as PCP - General (Internal Medicine) Hubbard Robinson, MD as Consulting Physician (Surgery)  Cancer Staging No matching staging information was found for the patient.   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 Recm surviellance; Dr.Crystal.  # July-AUG 2020-PET scan-recurrent/progression of follicular lymphoma-asymptomatic continue surveillance -----------------------------------------------------------  NOV 2019- Syncope/ amarousis fugax- MRI-no acute process- [Dr.Shah]-  DIAGNOSIS: Follicular lymphoma  STAGE:  I     ;GOALS: control  CURRENT/MOST RECENT THERAPY: surveillaince       B-cell lymphoma ([place])  08/25/2014 Initial Diagnosis   B-cell lymphoma (HCC)   DLBCL (diffuse large B cell lymphoma) ([place])  05/27/2016 Initial Diagnosis   DLBCL (diffuse large B cell lymphoma) (HCC)   Follicular lymphoma grade I of intra-abdominal lymph nodes ([place])  07/16/2016 Initial Diagnosis   Follicular lymphoma grade I of intra-abdominal lymph nodes (HCC)     INTERVAL HISTORY:  Troy Reynolds 69 y.o.  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  Patient denies any nausea vomiting abdominal pain but denies any night sweats.  Denies any weight loss.  Complains of mild pain in his lower abdomen right side compared to left.  No constipation.  No blood in stools or black or stools.  Is spending most of the time with his family grandchildren.  Review of Systems  Constitutional: Positive for  malaise/fatigue. Negative for chills, diaphoresis, fever and weight loss.  HENT: Negative for nosebleeds and sore throat.   Eyes: Negative for double vision.  Respiratory: Negative for cough, hemoptysis, sputum production, shortness of breath and wheezing.   Cardiovascular: Negative for chest pain, palpitations, orthopnea and leg swelling.  Gastrointestinal: Negative for abdominal pain, blood in stool, constipation, diarrhea, heartburn, melena and vomiting.       Abdominal distention/ventral hernia.  Genitourinary: Negative for dysuria, frequency and urgency.  Musculoskeletal: Negative for back pain and joint pain.  Skin: Negative.  Negative for itching and rash.  Neurological: Negative for dizziness, tingling, focal weakness, weakness and headaches.  Endo/Heme/Allergies: Does not bruise/bleed easily.  Psychiatric/Behavioral: Negative for depression. The patient is not nervous/anxious and does not have insomnia.       PAST MEDICAL HISTORY :  Past Medical History:  Diagnosis Date  . Follicular lymphoma ([place]) 2014   Chemo tx's.  Marland Kitchen GERD (gastroesophageal reflux disease)   . Hypertension     PAST SURGICAL HISTORY :   Past Surgical History:  Procedure Laterality Date  . BOWEL RESECTION N/A 06/19/2016   Procedure: SMALL BOWEL RESECTION;  Surgeon: Hubbard Robinson, MD;  Location: [facility] ORS;  Service: General;  Laterality: N/A;  . CHOLECYSTECTOMY    . LAPAROSCOPIC REMOVAL OF MESENTERIC MASS N/A 06/19/2016   Procedure: LAPAROSCOPIC REMOVAL OF MESENTERIC MASS;  Surgeon: Hubbard Robinson, MD;  Location: [facility] ORS;  Service: General;  Laterality: N/A;  . LYMPH NODE DISSECTION    . PORT-A-CATH REMOVAL    . PORTACATH PLACEMENT      FAMILY HISTORY :   Family History  Problem Relation Age of Onset  . Clotting disorder Mother   .  Lymphoma Father   . Hiatal hernia Father   . Heart disease Father        stent placed  . Cancer Paternal Uncle        esophagus  . Brain cancer Maternal Aunt      SOCIAL HISTORY:   Social History   Tobacco Use  . Smoking status: Never Smoker  . Smokeless tobacco: Never Used  Substance Use Topics  . Alcohol use: No  . Drug use: No    ALLERGIES:  has No Known Allergies.  MEDICATIONS:  Current Outpatient Medications  Medication Sig Dispense Refill  . aspirin 81 MG chewable tablet Chew 1 tablet by mouth daily.    . cetirizine (ZYRTEC) 10 MG tablet Take 1 tablet by mouth daily.    . fluticasone (FLONASE) 50 MCG/ACT nasal spray Place 1 spray into both nostrils daily as needed for allergies or rhinitis.    Marland Kitchen losartan-hydrochlorothiazide (HYZAAR) 50-12.5 MG per tablet TAKE 1 TABLET DAILY.    . Melatonin 3 MG TABS Take 3 mg by mouth at bedtime.    Marland Kitchen MELATONIN PO Take 2 mg by mouth at bedtime as needed.     . Multiple Vitamin (MULTIVITAMIN WITH MINERALS) TABS tablet Take 1 tablet by mouth daily.    . ondansetron (ZOFRAN ODT) 4 MG disintegrating tablet Take 1 tablet (4 mg total) by mouth every 6 (six) hours as needed for nausea or vomiting. 20 tablet 0  . pantoprazole (PROTONIX) 40 MG tablet Take 40 mg by mouth daily.     . polyethylene glycol (MIRALAX / GLYCOLAX) packet Take 17 g by mouth daily.    . Probiotic Product (PHILLIPS COLON HEALTH PO) Take 1 capsule by mouth daily.    . ranitidine (ZANTAC) 150 MG tablet Take 150 mg by mouth at bedtime as needed for heartburn.     . simvastatin (ZOCOR) 20 MG tablet Take 1 tablet by mouth daily.     No current facility-administered medications for this visit.    PHYSICAL EXAMINATION: ECOG PERFORMANCE STATUS: 1 - Symptomatic but completely ambulatory  There were no vitals taken for this visit.  There were no vitals filed for this visit. Physical Exam  Constitutional: He is oriented to person, place, and time and well-developed, well-nourished, and in no distress.  HENT:  Head: Normocephalic and atraumatic.  Mouth/Throat: Oropharynx is clear and moist. No oropharyngeal exudate.  Eyes: Pupils are  equal, round, and reactive to light.  Cardiovascular: Normal rate and regular rhythm.  Pulmonary/Chest: No respiratory distress. He has no wheezes.  Abdominal: Soft. Bowel sounds are normal. He exhibits no distension and no mass. There is no abdominal tenderness. There is no rebound and no guarding.  Midabdominal incision noted.  Well-healed.  Musculoskeletal:        General: No tenderness or edema. Normal range of motion.     Cervical back: Normal range of motion and neck supple.  Neurological: He is alert and oriented to person, place, and time.  Skin: Skin is warm.  Psychiatric: Affect normal.      LABORATORY DATA:  I have reviewed the data as listed    Component Value Date/Time   NA 140 01/17/2020 0822   NA 140 03/12/2015 1007   K 3.8 01/17/2020 0822   K 3.7 03/12/2015 1007   CL 105 01/17/2020 0822   CL 105 03/12/2015 1007   CO2 27 01/17/2020 0822   CO2 29 03/12/2015 1007   GLUCOSE 106 (H) 01/17/2020 0822   GLUCOSE 143 (  H) 03/12/2015 1007   BUN 18 01/17/2020 0822   BUN 15 03/12/2015 1007   CREATININE 0.92 01/17/2020 0822   CREATININE 0.92 03/12/2015 1007   CALCIUM 8.7 (L) 01/17/2020 0822   CALCIUM 8.6 (L) 03/12/2015 1007   PROT 6.5 01/17/2020 0822   PROT 6.6 03/12/2015 1007   ALBUMIN 3.8 01/17/2020 0822   ALBUMIN 4.0 03/12/2015 1007   AST 23 01/17/2020 0822   AST 26 03/12/2015 1007   ALT 25 01/17/2020 0822   ALT 37 03/12/2015 1007   ALKPHOS 60 01/17/2020 0822   ALKPHOS 79 03/12/2015 1007   BILITOT 0.6 01/17/2020 0822   BILITOT 0.8 03/12/2015 1007   GFRNONAA >60 01/17/2020 0822   GFRNONAA >60 03/12/2015 1007   GFRAA >60 01/17/2020 0822   GFRAA >60 03/12/2015 1007    No results found for: SPEP, UPEP  Lab Results  Component Value Date   WBC 4.3 01/17/2020   NEUTROABS 2.3 01/17/2020   HGB 15.0 01/17/2020   HCT 46.1 01/17/2020   MCV 87.3 01/17/2020   PLT 170 01/17/2020      Chemistry      Component Value Date/Time   NA 140 01/17/2020 0822   NA 140  03/12/2015 1007   K 3.8 01/17/2020 0822   K 3.7 03/12/2015 1007   CL 105 01/17/2020 0822   CL 105 03/12/2015 1007   CO2 27 01/17/2020 0822   CO2 29 03/12/2015 1007   BUN 18 01/17/2020 0822   BUN 15 03/12/2015 1007   CREATININE 0.92 01/17/2020 0822   CREATININE 0.92 03/12/2015 1007      Component Value Date/Time   CALCIUM 8.7 (L) 01/17/2020 0822   CALCIUM 8.6 (L) 03/12/2015 1007   ALKPHOS 60 01/17/2020 0822   ALKPHOS 79 03/12/2015 1007   AST 23 01/17/2020 0822   AST 26 03/12/2015 1007   ALT 25 01/17/2020 0822   ALT 37 03/12/2015 1007   BILITOT 0.6 01/17/2020 0822   BILITOT 0.8 03/12/2015 1007       RADIOGRAPHIC STUDIES: I have personally reviewed the radiological images as listed and agreed with the findings in the report. No results found.   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  Currently on surveillance.  FEB 2021- CT ~1 to 2 cm abdominal lymph nodes noted overall stable.  Not any worse.  # Currently patient continues to be clinically asymptomatic. Plan PET; then if stable- annual imaging.   # Bilateral groin hernias-stable; no acute symptoms.  If worse defer to PCP/surgery.   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  Stable  # Thoracic AA-4.2 cm; not seen on most recent abdomen pelvis CT.  Clinically stable. Await PET as planned in 6 months.  Recommend follow-up with PCP-if worse would recommend evaluation with thoracic surgery.  # DISPOSITION: # follow up in 6 months; MD- labs- CBC/MP/LDh- PET prior [CT]- Dr.B  # I reviewed the blood work- with the patient in detail; also reviewed the imaging independently [as summarized above]; and with the patient in detail.       Orders Placed This Encounter  Procedures  . NM PET Image Restag (PS) Skull Base To Thigh    Standing Status:   Future    Standing Expiration Date:    01/18/2021    Order Specific Question:   ** REASON FOR EXAM (FREE TEXT)    Answer:  lymphoma    Order Specific Question:   If indicated for the ordered procedure, I authorize the administration of a radiopharmaceutical per Radiology protocol    Answer:   Yes    Order Specific Question:   Radiology Contrast Protocol - do NOT remove file path    Answer:   [email or web address]   All questions were answered. The patient knows to call the clinic with any problems, questions or concerns.      Cammie Sickle, MD 01/20/2020 8:04 AM

## 2020-02-07 ENCOUNTER — Ambulatory Visit: Payer: Medicare HMO | Attending: Internal Medicine

## 2020-02-07 DIAGNOSIS — Z23 Encounter for immunization: Secondary | ICD-10-CM

## 2020-02-07 NOTE — Progress Notes (Signed)
   Covid-19 Vaccination Clinic  Name:  Troy Reynolds    MRN: [id number] DOB: 21-Mar-1951  02/07/2020  [name] was observed post Covid-19 immunization for 15 minutes without incident. He was provided with Vaccine Information Sheet and instruction to access the V-Safe system.   [name] was instructed to call 911 with any severe reactions post vaccine: Marland Kitchen Difficulty breathing  . Swelling of face and throat  . A fast heartbeat  . A bad rash all over body  . Dizziness and weakness   Immunizations Administered    Name Date Dose VIS Date Route   Pfizer COVID-19 Vaccine 02/07/2020 11:04 AM 0.3 mL 11/04/2019 Intramuscular   Manufacturer: [place]   Lot: [id number]   [place]: [id number]

## 2020-07-16 ENCOUNTER — Telehealth: Payer: Self-pay | Admitting: *Deleted

## 2020-07-16 NOTE — Telephone Encounter (Signed)
Per Margreta Journey. - she already spoke to patient that scan is approved.

## 2020-07-16 NOTE — Telephone Encounter (Signed)
Msg sent to [facility] in scheduling to determine status on Pet scan.   Dr. Jacinto Reap- Please advise

## 2020-07-16 NOTE — Telephone Encounter (Signed)
Per Liana Crocker in cancer center scheduling, patient left voicemail about his PET. he received a letter over the weekend that auth was denied and wanted to know what he needed to do. please call him back. [phone].

## 2020-07-16 NOTE — Telephone Encounter (Signed)
Colette, per Margreta Journey- It was denied, I Appealed the determination and it's now Approved!    Will you contact patient to let him know to Keep all apts. Thanks.

## 2020-07-17 ENCOUNTER — Other Ambulatory Visit: Payer: Self-pay

## 2020-07-17 ENCOUNTER — Ambulatory Visit
Admission: RE | Admit: 2020-07-17 | Discharge: 2020-07-17 | Disposition: A | Discharge status: Home / Self Care | Payer: Medicare HMO | Source: Ambulatory Visit | Attending: Internal Medicine | Admitting: Internal Medicine

## 2020-07-17 DIAGNOSIS — I712 Thoracic aortic aneurysm, without rupture, unspecified: Secondary | ICD-10-CM

## 2020-07-17 DIAGNOSIS — C8203 Follicular lymphoma grade I, intra-abdominal lymph nodes: Secondary | ICD-10-CM | POA: Diagnosis present

## 2020-07-17 HISTORY — DX: Thoracic aortic aneurysm, without rupture, unspecified: I71.20

## 2020-07-17 LAB — GLUCOSE, CAPILLARY: Glucose-Capillary: 100 mg/dL — ABNORMAL HIGH (ref 70–99)

## 2020-07-17 MED ORDER — FLUDEOXYGLUCOSE F - 18 (FDG) INJECTION
10.6000 | Freq: Once | INTRAVENOUS | Status: AC | PRN
  Administered 2020-07-17: 10.91 via INTRAVENOUS

## 2020-07-18 ENCOUNTER — Other Ambulatory Visit: Payer: Self-pay

## 2020-07-18 DIAGNOSIS — C8203 Follicular lymphoma grade I, intra-abdominal lymph nodes: Secondary | ICD-10-CM

## 2020-07-19 ENCOUNTER — Inpatient Hospital Stay: Payer: Medicare HMO | Attending: Internal Medicine

## 2020-07-19 ENCOUNTER — Encounter: Payer: Self-pay | Admitting: Internal Medicine

## 2020-07-19 ENCOUNTER — Inpatient Hospital Stay (HOSPITAL_BASED_OUTPATIENT_CLINIC_OR_DEPARTMENT_OTHER): Payer: Medicare HMO | Admitting: Internal Medicine

## 2020-07-19 ENCOUNTER — Other Ambulatory Visit: Payer: Self-pay

## 2020-07-19 VITALS — BP 126/85 | HR 78 | Temp 97.5°F | Resp 16 | Ht 73.0 in | Wt 210.0 lb

## 2020-07-19 DIAGNOSIS — I712 Thoracic aortic aneurysm, without rupture, unspecified: Secondary | ICD-10-CM

## 2020-07-19 DIAGNOSIS — C8203 Follicular lymphoma grade I, intra-abdominal lymph nodes: Secondary | ICD-10-CM

## 2020-07-19 DIAGNOSIS — Z800 Family history of malignant neoplasm of digestive organs: Secondary | ICD-10-CM | POA: Insufficient documentation

## 2020-07-19 DIAGNOSIS — Z808 Family history of malignant neoplasm of other organs or systems: Secondary | ICD-10-CM | POA: Diagnosis not present

## 2020-07-19 DIAGNOSIS — Z8572 Personal history of non-Hodgkin lymphomas: Secondary | ICD-10-CM | POA: Diagnosis present

## 2020-07-19 LAB — CBC WITH DIFFERENTIAL/PLATELET
Abs Immature Granulocytes: 0.05 10*3/uL (ref 0.00–0.07)
Basophils Absolute: 0.1 10*3/uL (ref 0.0–0.1)
Basophils Relative: 1 %
Eosinophils Absolute: 0.2 10*3/uL (ref 0.0–0.5)
Eosinophils Relative: 3 %
HCT: 44.3 % (ref 39.0–52.0)
Hemoglobin: 14.9 g/dL (ref 13.0–17.0)
Immature Granulocytes: 1 %
Lymphocytes Relative: 16 %
Lymphs Abs: 0.9 10*3/uL (ref 0.7–4.0)
MCH: 28.5 pg (ref 26.0–34.0)
MCHC: 33.6 g/dL (ref 30.0–36.0)
MCV: 84.7 fL (ref 80.0–100.0)
Monocytes Absolute: 0.5 10*3/uL (ref 0.1–1.0)
Monocytes Relative: 9 %
Neutro Abs: 3.8 10*3/uL (ref 1.7–7.7)
Neutrophils Relative %: 70 %
Platelets: 206 10*3/uL (ref 150–400)
RBC: 5.23 MIL/uL (ref 4.22–5.81)
RDW: 13.7 % (ref 11.5–15.5)
WBC: 5.4 10*3/uL (ref 4.0–10.5)
nRBC: 0 % (ref 0.0–0.2)

## 2020-07-19 LAB — COMPREHENSIVE METABOLIC PANEL
ALT: 24 U/L (ref 0–44)
AST: 26 U/L (ref 15–41)
Albumin: 4.1 g/dL (ref 3.5–5.0)
Alkaline Phosphatase: 63 U/L (ref 38–126)
Anion gap: 9 (ref 5–15)
BUN: 13 mg/dL (ref 8–23)
CO2: 27 mmol/L (ref 22–32)
Calcium: 8.6 mg/dL — ABNORMAL LOW (ref 8.9–10.3)
Chloride: 105 mmol/L (ref 98–111)
Creatinine, Ser: 0.91 mg/dL (ref 0.61–1.24)
GFR calc Af Amer: >60 mL/min (ref >60)
GFR calc non Af Amer: >60 mL/min (ref >60)
Glucose, Bld: 152 mg/dL — ABNORMAL HIGH (ref 70–99)
Potassium: 3.6 mmol/L (ref 3.5–5.1)
Sodium: 141 mmol/L (ref 135–145)
Total Bilirubin: 0.6 mg/dL (ref 0.3–1.2)
Total Protein: 6.7 g/dL (ref 6.5–8.1)

## 2020-07-19 LAB — LACTATE DEHYDROGENASE: LDH: 132 U/L (ref 98–192)

## 2020-07-19 NOTE — Assessment & Plan Note (Addendum)
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  Currently on surveillance. AUG 24/2021- PET-multiple enlarged mesenteric lymph nodes fairly unchanged from PET scan of August 2020; one of the right side lymph node shows increased FDG activity; rest of the lymph are stable. No evidence of new disease. Recommend continued surveillance.  # STABLE; continue surviellaince; will repeat imaging in 12 months.    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 Stable  # Thoracic AA-4.2 cm-stable on PET scan; recommend evaluation with thoracic surgery.  COVID BOOSTER: Discussed given patient's diagnosis and other comorbidities/therapies-patient would be considered immunocompromised.  As per CDC recommendation/FDA approval-I would recommend booster vaccine.  Patient is interested.    # DISPOSITION: # Referral to Dr.Oaks re: aortic aneurysm # follow up in 6 months; MD- labs- CBC/MP/LDH-..- Dr.B

## 2020-07-19 NOTE — Progress Notes (Signed)
[place] OFFICE PROGRESS NOTE  Patient Care Team: Rusty Aus, MD as PCP - General (Internal Medicine) Hubbard Robinson, MD as Consulting Physician (Surgery)  Cancer Staging No matching staging information was found for the patient.   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 Recm surviellance; Dr.Crystal.  # July-AUG 2020-PET scan-recurrent/progression of follicular lymphoma-asymptomatic continue surveillance -----------------------------------------------------------  NOV 2019- Syncope/ amarousis fugax- MRI-no acute process- [Dr.Shah]-  DIAGNOSIS: Follicular lymphoma  STAGE:  I     ;GOALS: control  CURRENT/MOST RECENT THERAPY: surveillaince       B-cell lymphoma (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  Patient denies any abdominal pain.  Any nausea vomiting.  No night sweats.  No worsening fatigue.  Denies any new lumps or bumps.  Review of Systems  Constitutional: Positive for malaise/fatigue. Negative for chills, diaphoresis, fever and weight loss.  HENT: Negative for nosebleeds and sore throat.   Eyes: Negative for double vision.   Respiratory: Negative for cough, hemoptysis, sputum production, shortness of breath and wheezing.   Cardiovascular: Negative for chest pain, palpitations, orthopnea and leg swelling.  Gastrointestinal: Negative for abdominal pain, blood in stool, constipation, diarrhea, heartburn, melena and vomiting.       Abdominal distention/ventral hernia.  Genitourinary: Negative for dysuria, frequency and urgency.  Musculoskeletal: Negative for back pain and joint pain.  Skin: Negative.  Negative for itching and rash.  Neurological: Negative for dizziness, tingling, focal weakness, weakness and headaches.  Endo/Heme/Allergies: Does not bruise/bleed easily.  Psychiatric/Behavioral: Negative for depression. The patient is not nervous/anxious and does not have insomnia.       PAST MEDICAL HISTORY :  Past Medical History:  Diagnosis Date  . Follicular lymphoma ([place]) 2014   Chemo tx's.  Marland Kitchen GERD (gastroesophageal reflux disease)   . Hypertension     PAST SURGICAL HISTORY :   Past Surgical History:  Procedure Laterality Date  . BOWEL RESECTION N/A 06/19/2016   Procedure: SMALL BOWEL RESECTION;  Surgeon: Hubbard Robinson, MD;  Location: [facility] ORS;  Service: General;  Laterality: N/A;  . CHOLECYSTECTOMY    . LAPAROSCOPIC REMOVAL OF MESENTERIC MASS N/A 06/19/2016   Procedure: LAPAROSCOPIC REMOVAL OF MESENTERIC MASS;  Surgeon: Hubbard Robinson, MD;  Location: [facility] ORS;  Service: General;  Laterality: N/A;  . LYMPH NODE DISSECTION    . PORT-A-CATH REMOVAL    . PORTACATH PLACEMENT      FAMILY HISTORY :   Family History  Problem Relation Age of Onset  . Clotting disorder Mother   . Lymphoma Father   . Hiatal hernia Father   . Heart disease Father        stent placed  . Cancer Paternal  Uncle        esophagus  . Brain cancer Maternal Aunt     SOCIAL HISTORY:   Social History   Tobacco Use  . Smoking status: Never Smoker  . Smokeless tobacco: Never Used  Substance Use Topics  . Alcohol  use: No  . Drug use: No    ALLERGIES:  has No Known Allergies.  MEDICATIONS:  Current Outpatient Medications  Medication Sig Dispense Refill  . aspirin 81 MG chewable tablet Chew 1 tablet by mouth daily.    . cetirizine (ZYRTEC) 10 MG tablet Take 1 tablet by mouth daily.    . fluticasone (FLONASE) 50 MCG/ACT nasal spray Place 1 spray into both nostrils daily as needed for allergies or rhinitis.    Marland Kitchen losartan-hydrochlorothiazide (HYZAAR) 50-12.5 MG per tablet TAKE 1 TABLET DAILY.    . Magnesium 250 MG TABS Take by mouth.    . Melatonin 3 MG TABS Take 3 mg by mouth at bedtime.    Marland Kitchen MELATONIN PO Take 2 mg by mouth at bedtime as needed.     . Multiple Vitamin (MULTIVITAMIN WITH MINERALS) TABS tablet Take 1 tablet by mouth daily.    . pantoprazole (PROTONIX) 40 MG tablet Take 40 mg by mouth daily.     . Probiotic Product (PHILLIPS COLON HEALTH PO) Take 1 capsule by mouth daily.    . simvastatin (ZOCOR) 20 MG tablet Take 1 tablet by mouth daily.    . tamsulosin (FLOMAX) 0.4 MG CAPS capsule Take by mouth.    . ondansetron (ZOFRAN ODT) 4 MG disintegrating tablet Take 1 tablet (4 mg total) by mouth every 6 (six) hours as needed for nausea or vomiting. (Patient not taking: Reported on 07/19/2020) 20 tablet 0  . polyethylene glycol (MIRALAX / GLYCOLAX) packet Take 17 g by mouth daily. (Patient not taking: Reported on 07/19/2020)    . ranitidine (ZANTAC) 150 MG tablet Take 150 mg by mouth at bedtime as needed for heartburn.  (Patient not taking: Reported on 07/19/2020)     No current facility-administered medications for this visit.    PHYSICAL EXAMINATION: ECOG PERFORMANCE STATUS: 1 - Symptomatic but completely ambulatory  BP 126/85 (BP Location: Left Arm, Patient Position: Sitting, Cuff Size: Large)   Pulse 78   Temp (!) 97.5 F (36.4 C) (Tympanic)   Resp 16   Ht 6\' 1"  (1.854 m)   Wt 210 lb (95.3 kg)   SpO2 99%   BMI 27.71 kg/m   Filed Weights   07/19/20 1042  Weight: 210 lb (95.3 kg)    Physical Exam HENT:     Head: Normocephalic and atraumatic.     Mouth/Throat:     Pharynx: No oropharyngeal exudate.  Eyes:     Pupils: Pupils are equal, round, and reactive to light.  Cardiovascular:     Rate and Rhythm: Normal rate and regular rhythm.  Pulmonary:     Effort: No respiratory distress.     Breath sounds: No wheezing.  Abdominal:     General: Bowel sounds are normal. There is no distension.     Palpations: Abdomen is soft. There is no mass.     Tenderness: There is no abdominal tenderness. There is no guarding or rebound.     Comments: Midabdominal incision noted.  Well-healed.  Musculoskeletal:        General: No tenderness. Normal range of motion.     Cervical back: Normal range of motion and neck supple.  Skin:    General: Skin  is warm.  Neurological:     Mental Status: He is alert and oriented to person, place, and time.  Psychiatric:        Mood and Affect: Affect normal.       LABORATORY DATA:  I have reviewed the data as listed    Component Value Date/Time   NA 141 07/19/2020 1011   NA 140 03/12/2015 1007   K 3.6 07/19/2020 1011   K 3.7 03/12/2015 1007   CL 105 07/19/2020 1011   CL 105 03/12/2015 1007   CO2 27 07/19/2020 1011   CO2 29 03/12/2015 1007   GLUCOSE 152 (H) 07/19/2020 1011   GLUCOSE 143 (H) 03/12/2015 1007   BUN 13 07/19/2020 1011   BUN 15 03/12/2015 1007   CREATININE 0.91 07/19/2020 1011   CREATININE 0.92 03/12/2015 1007   CALCIUM 8.6 (L) 07/19/2020 1011   CALCIUM 8.6 (L) 03/12/2015 1007   PROT 6.7 07/19/2020 1011   PROT 6.6 03/12/2015 1007   ALBUMIN 4.1 07/19/2020 1011   ALBUMIN 4.0 03/12/2015 1007   AST 26 07/19/2020 1011   AST 26 03/12/2015 1007   ALT 24 07/19/2020 1011   ALT 37 03/12/2015 1007   ALKPHOS 63 07/19/2020 1011   ALKPHOS 79 03/12/2015 1007   BILITOT 0.6 07/19/2020 1011   BILITOT 0.8 03/12/2015 1007   GFRNONAA >60 07/19/2020 1011   GFRNONAA >60 03/12/2015 1007   GFRAA >60 07/19/2020 1011   GFRAA >60  03/12/2015 1007    No results found for: SPEP, UPEP  Lab Results  Component Value Date   WBC 5.4 07/19/2020   NEUTROABS 3.8 07/19/2020   HGB 14.9 07/19/2020   HCT 44.3 07/19/2020   MCV 84.7 07/19/2020   PLT 206 07/19/2020      Chemistry      Component Value Date/Time   NA 141 07/19/2020 1011   NA 140 03/12/2015 1007   K 3.6 07/19/2020 1011   K 3.7 03/12/2015 1007   CL 105 07/19/2020 1011   CL 105 03/12/2015 1007   CO2 27 07/19/2020 1011   CO2 29 03/12/2015 1007   BUN 13 07/19/2020 1011   BUN 15 03/12/2015 1007   CREATININE 0.91 07/19/2020 1011   CREATININE 0.92 03/12/2015 1007      Component Value Date/Time   CALCIUM 8.6 (L) 07/19/2020 1011   CALCIUM 8.6 (L) 03/12/2015 1007   ALKPHOS 63 07/19/2020 1011   ALKPHOS 79 03/12/2015 1007   AST 26 07/19/2020 1011   AST 26 03/12/2015 1007   ALT 24 07/19/2020 1011   ALT 37 03/12/2015 1007   BILITOT 0.6 07/19/2020 1011   BILITOT 0.8 03/12/2015 1007       RADIOGRAPHIC STUDIES: I have personally reviewed the radiological images as listed and agreed with the findings in the report. No results found.   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  Currently on surveillance. AUG 24/2021- PET-multiple enlarged mesenteric lymph nodes fairly unchanged from PET scan of August 2020; one of the right side lymph node shows increased FDG activity; rest of the lymph are stable. No evidence of new disease. Recommend continued surveillance.  # STABLE; continue surviellaince; will repeat imaging in 12 months.    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 Stable  # Thoracic AA-4.2 cm-stable on PET scan; recommend evaluation with thoracic surgery.  COVID BOOSTER: Discussed given patient's diagnosis  and other comorbidities/therapies-patient would be considered immunocompromised.  As per CDC  recommendation/FDA approval-I would recommend booster vaccine.  Patient is interested.    # DISPOSITION: # Referral to Dr.Oaks re: aortic aneurysm # follow up in 6 months; MD- labs- CBC/MP/LDH-..- Dr.B       Orders Placed This Encounter  Procedures  . CBC with Differential/Platelet    Standing Status:   Future    Standing Expiration Date:   07/19/2021  . Comprehensive metabolic panel    Standing Status:   Future    Standing Expiration Date:   07/19/2021  . Lactate dehydrogenase    Standing Status:   Future    Standing Expiration Date:   07/19/2021  . Ambulatory referral to General Surgery    Referral Priority:   Routine    Referral Type:   Surgical    Referral Reason:   Specialty Services Required    Referred to Provider:   Nestor Lewandowsky, MD    Requested Specialty:   General Surgery    Number of Visits Requested:   1   All questions were answered. The patient knows to call the clinic with any problems, questions or concerns.      Cammie Sickle, MD 07/20/2020 4:13 PM

## 2020-07-27 ENCOUNTER — Ambulatory Visit (INDEPENDENT_AMBULATORY_CARE_PROVIDER_SITE_OTHER): Payer: Medicare HMO | Admitting: Cardiothoracic Surgery

## 2020-07-27 ENCOUNTER — Encounter: Payer: Self-pay | Admitting: Cardiothoracic Surgery

## 2020-07-27 ENCOUNTER — Other Ambulatory Visit: Payer: Self-pay

## 2020-07-27 VITALS — BP 136/86 | HR 101 | Temp 98.1°F | Ht 73.0 in | Wt 211.4 lb

## 2020-07-27 DIAGNOSIS — I712 Thoracic aortic aneurysm, without rupture, unspecified: Secondary | ICD-10-CM

## 2020-07-27 NOTE — Progress Notes (Signed)
Patient ID: Troy Reynolds, male   DOB: [birth year], 69 y.o.   MRN: [id number]  Chief Complaint  Patient presents with  . New Patient (Initial Visit)    new pt ref Dr.Brahmanday aortic aneurysm    Referred By Dr. Iris Pert Monday Reason for Referral HPI Location, Quality, Duration, Severity, Timing, Context, Modifying Factors, Associated Signs and Symptoms.  Troy Reynolds is a 69 y.o. male.   this patient is a 69 year old gentleman with a history of lymphoma.  He was diagnosed with a laparotomy for a bowel and mesenteric mass.  He has been followed by Dr. Rogue Bussing for several years and has had multiple scans made over those years.  Recently he is been found to have an ascending aortic aneurysm measuring about 4.2 cm.  He comes in today for further consideration of management.  The patient states that he does have a history of hypertension and takes losartan and hydrochlorothiazide.  He also is a lifelong non-smoker.  He manages his blood pressure well and is very active.  He is retired [employer] administrator.  He denies any chest pain or shortness of breath.    Past Medical History:  Diagnosis Date  . Follicular lymphoma ([place]) 2014   Chemo tx's.  Troy Reynolds GERD (gastroesophageal reflux disease)   . Hypertension     Past Surgical History:  Procedure Laterality Date  . BOWEL RESECTION N/A 06/19/2016   Procedure: SMALL BOWEL RESECTION;  Surgeon: Hubbard Robinson, MD;  Location: [facility] ORS;  Service: General;  Laterality: N/A;  . CHOLECYSTECTOMY    . LAPAROSCOPIC REMOVAL OF MESENTERIC MASS N/A 06/19/2016   Procedure: LAPAROSCOPIC REMOVAL OF MESENTERIC MASS;  Surgeon: Hubbard Robinson, MD;  Location: [facility] ORS;  Service: General;  Laterality: N/A;  . LYMPH NODE DISSECTION    . PORT-A-CATH REMOVAL    . PORTACATH PLACEMENT      Family History  Problem Relation Age of Onset  . Clotting disorder Mother   . Lymphoma Father   . Hiatal hernia Father   . Heart disease Father        stent placed  . Cancer Paternal  Uncle        esophagus  . Brain cancer Maternal Aunt     Social History Social History   Tobacco Use  . Smoking status: Never Smoker  . Smokeless tobacco: Never Used  Substance Use Topics  . Alcohol use: No  . Drug use: No    No Known Allergies  Current Outpatient Medications  Medication Sig Dispense Refill  . aspirin 81 MG chewable tablet Chew 1 tablet by mouth daily.    . cetirizine (ZYRTEC) 10 MG tablet Take 1 tablet by mouth daily.    . fluticasone (FLONASE) 50 MCG/ACT nasal spray Place 1 spray into both nostrils daily as needed for allergies or rhinitis.    Troy Reynolds losartan-hydrochlorothiazide (HYZAAR) 50-12.5 MG per tablet TAKE 1 TABLET DAILY.    . Magnesium 250 MG TABS Take by mouth.    . Melatonin 3 MG TABS Take 3 mg by mouth at bedtime.    . Multiple Vitamin (MULTIVITAMIN WITH MINERALS) TABS tablet Take 1 tablet by mouth daily.    . pantoprazole (PROTONIX) 40 MG tablet Take 40 mg by mouth daily.     . Probiotic Product (PHILLIPS COLON HEALTH PO) Take 1 capsule by mouth daily.    . tamsulosin (FLOMAX) 0.4 MG CAPS capsule Take by mouth.    . MELATONIN PO Take 2 mg by mouth at bedtime as  needed.  (Patient not taking: Reported on 07/27/2020)    . ondansetron (ZOFRAN ODT) 4 MG disintegrating tablet Take 1 tablet (4 mg total) by mouth every 6 (six) hours as needed for nausea or vomiting. (Patient not taking: Reported on 07/19/2020) 20 tablet 0  . polyethylene glycol (MIRALAX / GLYCOLAX) packet Take 17 g by mouth daily. (Patient not taking: Reported on 07/19/2020)    . ranitidine (ZANTAC) 150 MG tablet Take 150 mg by mouth at bedtime as needed for heartburn.  (Patient not taking: Reported on 07/19/2020)    . simvastatin (ZOCOR) 20 MG tablet Take 1 tablet by mouth daily.     No current facility-administered medications for this visit.      Review of Systems A complete review of systems was asked and was negative except for the following positive findings cough, shortness of  breath.  Blood pressure 136/86, pulse (!) 101, temperature 98.1 F (36.7 C), temperature source Oral, height 6\' 1"  (1.854 m), weight 211 lb 6.4 oz (95.9 kg), SpO2 97 %.  Physical Exam CONSTITUTIONAL:  Pleasant, well-developed, well-nourished, and in no acute distress. EYES: Pupils equal and reactive to light, Sclera non-icteric EARS, NOSE, MOUTH AND THROAT:  The oropharynx was clear.  Dentition is good repair.  Oral mucosa pink and moist. LYMPH NODES:  Lymph nodes in the neck and axillae were normal RESPIRATORY:  Lungs were clear.  Normal respiratory effort without pathologic use of accessory muscles of respiration CARDIOVASCULAR: Heart was regular without murmurs.  There were no carotid bruits. GI: The abdomen was soft, nontender, and nondistended. There were no palpable masses. There was no hepatosplenomegaly. There were normal bowel sounds in all quadrants. GU:  Rectal deferred.   MUSCULOSKELETAL:  Normal muscle strength and tone.  No clubbing or cyanosis.   SKIN:  There were no pathologic skin lesions.  There were no nodules on palpation. NEUROLOGIC:  Sensation is normal.  Cranial nerves are grossly intact. PSYCH:  Oriented to person, place and time.  Mood and affect are normal.  Data Reviewed PET scan  I have personally reviewed the patient's imaging, laboratory findings and medical records.    Assessment    I have independently reviewed the patient's CT scans.  The ascending aorta measures approximately 4.2 cm.  I have gone back over the prior PET scans and essentially the ascending thing aorta measured about 4.2 cm in the past.    Plan    I would like him to come back in 1 year to get a contrast chest CT for further evaluation.  He understands and is agreeable.       Nestor Lewandowsky, MD 07/27/2020, 9:59 AM

## 2020-07-27 NOTE — Patient Instructions (Signed)
Dr.Oaks discussed with patient to have a CT Chest with Contrast in one year.  Dr.Oaks also discussed with patient to have an MRI if he would like to have, patient preference.   Thoracic Aortic Aneurysm  An aneurysm is a bulge in an artery. It happens when blood pushes up against a weakened or damaged artery wall. A thoracic aortic aneurysm is an aneurysm that occurs in the first part of the aorta, between the heart and the diaphragm. The aorta is the main artery of the body. It supplies blood from the heart to the rest of the body. Some aneurysms may not cause symptoms or problems. However, a thoracic aortic aneurysm can cause two serious problems:  It can enlarge and burst (rupture).  It can cause blood to flow between the layers of the wall of the aorta through a tear (aortic dissection). Both of these problems are medical emergencies. They can cause bleeding inside the body and can be life-threatening if they are not diagnosed and treated right away. What are the causes? The exact cause of this condition is not known. What increases the risk? The following factors may make you more likely to develop this condition:  Being 69 years of age or older.  Having a family history of aneurysms.  Using tobacco.  Having any of these conditions: ? Hardening of the arteries caused by the buildup of fat and other substances in the lining of a blood vessel (arteriosclerosis). ? Inflammation of the walls of an artery (arteritis). ? A genetic disease that weakens the body's connective tissue, such as Marfan syndrome. ? An injury or trauma to the aorta. ? High blood pressure (hypertension). ? High cholesterol. ? An infection from bacteria, such as syphilis or staphylococcus, in the wall of the aorta (infectious aortitis). What are the signs or symptoms? Symptoms of this condition vary depending on the size of the aneurysm and how fast it is growing. Most grow slowly and do not cause symptoms. When  symptoms do occur, they may include:  Pain in the chest, back, sides, or abdomen. The pain may vary in intensity. Sudden, severe pain may indicate that the aneurysm has ruptured.  Hoarseness.  Cough.  Shortness of breath.  Swallowing problems.  Swelling in the face, arms, or legs.  Fever.  Unexplained weight loss. How is this diagnosed? This condition may be diagnosed with:  An ultrasound.  X-rays.  CT scan.  MRI.  A test to check the arteries for damage or blockages (angiogram). Most unruptured thoracic aortic aneurysms cause no symptoms, so they are often found during exams for other conditions. How is this treated? Treatment for this condition depends on:  The size of the aneurysm.  How fast the aneurysm is growing.  69.  Risk factors for rupture. Small aneurysms (2.2 inches, or 5.5 cm, or less) may be managed with:  Medicines to: ? Control blood pressure. ? Manage pain. ? Fight infection.  Regular monitoring. This may include an ultrasound or CT scan every year or every 6 months to see if the aneurysm is getting bigger. Large or fast-growing aneurysms may be treated with surgery. Follow these instructions at home: Eating and drinking   Eat a healthy diet. Your health care provider may recommend that you: ? Lower your salt (sodium) intake. In some people, too much salt can raise blood pressure and increase the risk for thoracic aortic aneurysm. ? Avoid foods that are high in saturated fat and cholesterol, such as red meat and  full-fat dairy. ? Eat a diet that is low in sugar. ? Increase your fiber intake by including whole grains, vegetables, and fruits in your diet. Eating these foods may help to lower your blood pressure.  Do not drink alcohol if your health care provider tells you not to drink.  If you drink alcohol: ? Limit how much you use to:  0-1 drink a day for women.  0-2 drinks a day for men. ? Be aware of how much alcohol is in  your drink. In the [place] one drink equals one 12 oz bottle of beer (355 mL), one 5 oz glass of wine (148 mL), or one 1 oz glass of hard liquor (44 mL). Lifestyle  Do not use any products that contain nicotine or tobacco, such as cigarettes, e-cigarettes, and chewing tobacco. If you need help quitting, ask your health care provider.  Maintain a healthy weight.  Check your blood pressure regularly. Follow your health care provider's instructions on how to keep your blood pressure within normal limits.  Have your blood sugar (glucose) level and cholesterol levels checked regularly. Follow your health care provider's instructions on how to keep levels within normal limits. Activity   Stay physically active and exercise regularly. Talk with your health care provider about how often you should exercise and ask which types of exercise are best for you.  Avoid heavy lifting and activities that take a lot of effort. Ask your health care provider what activities are safe for you. General instructions  Take over-the-counter and prescription medicines only as told by your health care provider.  Talk with your health care provider about regular screenings to see if the aneurysm is getting bigger.  Keep all follow-up visits as told by your health care provider. This is important. Contact a health care provider if you have:  Unexplained weight loss. Get help right away if you have:  Pain in your upper back, neck, or abdomen. This pain may move into your chest and arms.  Trouble swallowing.  A cough or hoarseness.  Shortness of breath. Summary  A thoracic aortic aneurysm is an aneurysm that occurs in the first part of the aorta, between the heart and the diaphragm.  As a thoracic aortic aneurysm becomes larger, it can burst (rupture), or blood can flow between the layers of the wall of the aorta through a tear (aorticdissection). These conditions can be life-threatening if they are not  diagnosed and treated right away.  If you have a thoracic aortic aneurysm, its growth will be closely monitored. Surgical repair may be needed for larger or faster-growing aneurysms. This information is not intended to replace advice given to you by your health care provider. Make sure you discuss any questions you have with your health care provider. Document Revised: 06/29/2018 Document Reviewed: 06/30/2018 Elsevier Patient Education  [place].

## 2020-09-24 ENCOUNTER — Encounter: Payer: Self-pay | Admitting: Dermatology

## 2020-09-24 ENCOUNTER — Other Ambulatory Visit: Payer: Self-pay

## 2020-09-24 ENCOUNTER — Ambulatory Visit: Payer: Medicare HMO | Admitting: Dermatology

## 2020-09-24 DIAGNOSIS — L814 Other melanin hyperpigmentation: Secondary | ICD-10-CM | POA: Diagnosis not present

## 2020-09-24 DIAGNOSIS — D229 Melanocytic nevi, unspecified: Secondary | ICD-10-CM

## 2020-09-24 DIAGNOSIS — L821 Other seborrheic keratosis: Secondary | ICD-10-CM | POA: Diagnosis not present

## 2020-09-24 DIAGNOSIS — L570 Actinic keratosis: Secondary | ICD-10-CM

## 2020-09-24 DIAGNOSIS — L578 Other skin changes due to chronic exposure to nonionizing radiation: Secondary | ICD-10-CM

## 2020-09-24 DIAGNOSIS — Z1283 Encounter for screening for malignant neoplasm of skin: Secondary | ICD-10-CM | POA: Diagnosis not present

## 2020-09-24 DIAGNOSIS — D1801 Hemangioma of skin and subcutaneous tissue: Secondary | ICD-10-CM

## 2020-09-24 DIAGNOSIS — L299 Pruritus, unspecified: Secondary | ICD-10-CM

## 2020-09-24 DIAGNOSIS — L853 Xerosis cutis: Secondary | ICD-10-CM

## 2020-09-24 DIAGNOSIS — Z85828 Personal history of other malignant neoplasm of skin: Secondary | ICD-10-CM

## 2020-09-24 DIAGNOSIS — L820 Inflamed seborrheic keratosis: Secondary | ICD-10-CM

## 2020-09-24 NOTE — Progress Notes (Addendum)
Follow-Up Visit   Subjective  Troy Reynolds is a 69 y.o. male who presents for the following: Annual Exam (Hx of skin cancer treated by Dr. Koleen Nimrod in the past ) and pruritus (of the R arm - patient has tried OTC cortisone and Calamine lotion which did help some. He has a prescription at home for [facility] 0.1% and 0.05% creams. He wants to know if those would be helpful). The patient presents for Total-Body Skin Exam (TBSE) for skin cancer screening and mole check.  The following portions of the chart were reviewed this encounter and updated as appropriate:  Tobacco  Allergies  Meds  Problems  Med Hx  Surg Hx  Fam Hx     Review of Systems:  No other skin or systemic complaints except as noted in HPI or Assessment and Plan.  Objective  Well appearing patient in no apparent distress; mood and affect are within normal limits.  A full examination was performed including scalp, head, eyes, ears, nose, lips, neck, chest, axillae, abdomen, back, buttocks, bilateral upper extremities, bilateral lower extremities, hands, feet, fingers, toes, fingernails, and toenails. All findings within normal limits unless otherwise noted below.  Objective  R forearm x 2, L forearm x 2, chest x 2, L shoulder x 2, L side x 1 (9): Erythematous keratotic or waxy stuck-on papule or plaque.   Objective  L temple x 1, L nose x 1, face x 2 (4): Erythematous thin papules/macules with gritty scale.   Assessment & Plan  Inflamed seborrheic keratosis (9) R forearm x 2, L forearm x 2, chest x 2, L shoulder x 2, L side x 1  Destruction of lesion - R forearm x 2, L forearm x 2, chest x 2, L shoulder x 2, L side x 1 Complexity: simple   Destruction method: cryotherapy   Informed consent: discussed and consent obtained   Timeout:  patient name, date of birth, surgical site, and procedure verified Lesion destroyed using liquid nitrogen: Yes   Region frozen until ice ball extended beyond lesion: Yes   Outcome:  patient tolerated procedure well with no complications   Post-procedure details: wound care instructions given    Pruritus - chronic and may periodically flare R forearm Brachioradial pruritus - Difficult to tx, not skin related, caused by pinched nerve issue. Patient to follow up with his PCP.  Ok to try [facility] 0.1% to aa's BID PRN (patient has already). May not be helpful if condition is brachioradial pruritus  AK (actinic keratosis) (4) L temple x 1, L nose x 1, face x 2  Destruction of lesion - L temple x 1, L nose x 1, face x 2 Complexity: simple   Destruction method: cryotherapy   Informed consent: discussed and consent obtained   Timeout:  patient name, date of birth, surgical site, and procedure verified Lesion destroyed using liquid nitrogen: Yes   Region frozen until ice ball extended beyond lesion: Yes   Outcome: patient tolerated procedure well with no complications   Post-procedure details: wound care instructions given     Lentigines - Scattered tan macules - Discussed due to sun exposure - Benign, observe - Call for any changes  Seborrheic Keratoses - Stuck-on, waxy, tan-brown papules and plaques  - Discussed benign etiology and prognosis. - Observe - Call for any changes  Melanocytic Nevi - Tan-brown and/or pink-flesh-colored symmetric macules and papules - Benign appearing on exam today - Observation - Call clinic for new or changing moles - Recommend  daily use of broad spectrum spf 30+ sunscreen to sun-exposed areas.   Hemangiomas - Red papules - Discussed benign nature - Observe - Call for any changes  Actinic Damage - chronic, secondary to cumulative UV radiation exposure/sun exposure over time - diffuse scaly erythematous macules with underlying dyspigmentation - Recommend daily broad spectrum sunscreen SPF 30+ to sun-exposed areas, reapply every 2 hours as needed.  - Call for new or changing lesions  Hx of Skin Cancer - Unsure which type, treated  by Dr. Koleen Nimrod many years ago  - No evidence of recurrence today - No lymphadenopathy - Recommend regular full body skin exams - Recommend daily broad spectrum sunscreen SPF 30+ to sun-exposed areas, reapply every 2 hours as needed.  - Call if any new or changing lesions are noted between office visits  Xerosis - diffuse xerotic patches - recommend gentle, hydrating skin care - gentle skin care handout given  [place] - L lat ankle  - Dilated blue, purple or red veins at the lower extremities - Reassured - These can be treated by sclerotherapy (a procedure to inject a medicine into the veins to make them disappear) if desired, but the treatment is not covered by insurance  Skin cancer screening performed today.  Return in about 4 months (around 01/22/2021) for ISK and AK recheck.  Luther Redo, CMA, am acting as scribe for Sarina Ser, MD .  Documentation: I have reviewed the above documentation for accuracy and completeness, and I agree with the above.  Sarina Ser, MD

## 2020-09-26 NOTE — Addendum Note (Signed)
Addended by: Ralene Bathe on: 09/26/2020 01:25 PM   Modules accepted: Level of Service

## 2021-01-18 ENCOUNTER — Encounter: Payer: Self-pay | Admitting: Internal Medicine

## 2021-01-18 ENCOUNTER — Inpatient Hospital Stay: Payer: Medicare HMO | Attending: Internal Medicine

## 2021-01-18 ENCOUNTER — Inpatient Hospital Stay (HOSPITAL_BASED_OUTPATIENT_CLINIC_OR_DEPARTMENT_OTHER): Payer: Medicare HMO | Admitting: Internal Medicine

## 2021-01-18 ENCOUNTER — Other Ambulatory Visit: Payer: Self-pay

## 2021-01-18 VITALS — BP 127/77 | HR 85 | Temp 97.8°F | Resp 20 | Ht 73.0 in | Wt 212.0 lb

## 2021-01-18 DIAGNOSIS — Z79899 Other long term (current) drug therapy: Secondary | ICD-10-CM | POA: Diagnosis not present

## 2021-01-18 DIAGNOSIS — C8203 Follicular lymphoma grade I, intra-abdominal lymph nodes: Secondary | ICD-10-CM

## 2021-01-18 DIAGNOSIS — I712 Thoracic aortic aneurysm, without rupture, unspecified: Secondary | ICD-10-CM

## 2021-01-18 DIAGNOSIS — Z1211 Encounter for screening for malignant neoplasm of colon: Secondary | ICD-10-CM

## 2021-01-18 DIAGNOSIS — Z8572 Personal history of non-Hodgkin lymphomas: Secondary | ICD-10-CM | POA: Insufficient documentation

## 2021-01-18 DIAGNOSIS — Z9221 Personal history of antineoplastic chemotherapy: Secondary | ICD-10-CM | POA: Insufficient documentation

## 2021-01-18 DIAGNOSIS — R1310 Dysphagia, unspecified: Secondary | ICD-10-CM

## 2021-01-18 DIAGNOSIS — I10 Essential (primary) hypertension: Secondary | ICD-10-CM | POA: Insufficient documentation

## 2021-01-18 DIAGNOSIS — Z923 Personal history of irradiation: Secondary | ICD-10-CM | POA: Diagnosis not present

## 2021-01-18 LAB — COMPREHENSIVE METABOLIC PANEL
ALT: 24 U/L (ref 0–44)
AST: 23 U/L (ref 15–41)
Albumin: 3.9 g/dL (ref 3.5–5.0)
Alkaline Phosphatase: 75 U/L (ref 38–126)
Anion gap: 10 (ref 5–15)
BUN: 15 mg/dL (ref 8–23)
CO2: 25 mmol/L (ref 22–32)
Calcium: 8.5 mg/dL — ABNORMAL LOW (ref 8.9–10.3)
Chloride: 103 mmol/L (ref 98–111)
Creatinine, Ser: 1.1 mg/dL (ref 0.61–1.24)
GFR, Estimated: >60 mL/min (ref >60)
Glucose, Bld: 178 mg/dL — ABNORMAL HIGH (ref 70–99)
Potassium: 3.8 mmol/L (ref 3.5–5.1)
Sodium: 138 mmol/L (ref 135–145)
Total Bilirubin: 0.8 mg/dL (ref 0.3–1.2)
Total Protein: 6.6 g/dL (ref 6.5–8.1)

## 2021-01-18 LAB — CBC WITH DIFFERENTIAL/PLATELET
Abs Immature Granulocytes: 0.06 10*3/uL (ref 0.00–0.07)
Basophils Absolute: 0.1 10*3/uL (ref 0.0–0.1)
Basophils Relative: 1 %
Eosinophils Absolute: 0.2 10*3/uL (ref 0.0–0.5)
Eosinophils Relative: 5 %
HCT: 45.9 % (ref 39.0–52.0)
Hemoglobin: 15.2 g/dL (ref 13.0–17.0)
Immature Granulocytes: 1 %
Lymphocytes Relative: 18 %
Lymphs Abs: 0.9 10*3/uL (ref 0.7–4.0)
MCH: 27.9 pg (ref 26.0–34.0)
MCHC: 33.1 g/dL (ref 30.0–36.0)
MCV: 84.4 fL (ref 80.0–100.0)
Monocytes Absolute: 0.4 10*3/uL (ref 0.1–1.0)
Monocytes Relative: 7 %
Neutro Abs: 3.5 10*3/uL (ref 1.7–7.7)
Neutrophils Relative %: 68 %
Platelets: 208 10*3/uL (ref 150–400)
RBC: 5.44 MIL/uL (ref 4.22–5.81)
RDW: 14.6 % (ref 11.5–15.5)
WBC: 5.1 10*3/uL (ref 4.0–10.5)
nRBC: 0 % (ref 0.0–0.2)

## 2021-01-18 LAB — LACTATE DEHYDROGENASE: LDH: 122 U/L (ref 98–192)

## 2021-01-18 NOTE — Assessment & Plan Note (Addendum)
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  Currently on surveillance. AUG 24/2021- PET-multiple enlarged mesenteric lymph nodes fairly unchanged from PET scan of August 2020; one of the right side lymph node shows increased FDG activity; clinically stable. continue surviellaince; will repeat imaging in 6 months.   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  # Dysphagia: few months;intermittent liquids/solids; discussed re: esophagogram;declines. abdominal bloating- refer to GI/Dr.Toledo. await GI evaluation.   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

## 2021-01-18 NOTE — Progress Notes (Signed)
Patient reports dysphagia and getting choked easily with solids.

## 2021-01-18 NOTE — Progress Notes (Signed)
[place] OFFICE PROGRESS NOTE  Patient Care Team: Rusty Aus, MD as PCP - General (Internal Medicine) Hubbard Robinson, MD as Consulting Physician (Surgery)  Cancer Staging No matching staging information was found for the patient.   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 Recm surviellance; Dr.Crystal.  # July-AUG 2020-PET scan-recurrent/progression of follicular lymphoma-asymptomatic continue surveillance -----------------------------------------------------------  NOV 2019- Syncope/ amarousis fugax- MRI-no acute process- [Dr.Shah]-  DIAGNOSIS: Follicular lymphoma  STAGE:  I     ;GOALS: control  CURRENT/MOST RECENT THERAPY: surveillaince       B-cell lymphoma ([place])  08/25/2014 Initial Diagnosis   B-cell lymphoma ([facility])   DLBCL (diffuse large B cell lymphoma) ([place])  05/27/2016 Initial Diagnosis   DLBCL (diffuse large B cell lymphoma) ([facility])   Follicular lymphoma grade I of intra-abdominal lymph nodes ([place])  07/16/2016 Initial Diagnosis   Follicular lymphoma grade I of intra-abdominal lymph nodes ([place])     INTERVAL HISTORY:  Troy Reynolds 70 y.o.  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  Patient complains of intermittent difficulty swallowing for the last few months..  Both liquids and solids.  Denies any weight loss.  Also admits to abdominal bloating/intermittent constipation.  No blood in stools or no black or stools.  No new lumps or bumps.  No chest pain.  Review of Systems  Constitutional: Negative for chills, diaphoresis, fever and weight loss.  HENT:  Negative for nosebleeds and sore throat.   Eyes: Negative for double vision.  Respiratory: Negative for cough, hemoptysis, sputum production, shortness of breath and wheezing.   Cardiovascular: Negative for chest pain, palpitations, orthopnea and leg swelling.  Gastrointestinal: Positive for constipation. Negative for abdominal pain, blood in stool, diarrhea, heartburn, melena and vomiting.       Abdominal discomfort/bloating.  Genitourinary: Negative for dysuria, frequency and urgency.  Musculoskeletal: Negative for back pain and joint pain.  Skin: Negative.  Negative for itching and rash.  Neurological: Negative for dizziness, tingling, focal weakness, weakness and headaches.  Endo/Heme/Allergies: Does not bruise/bleed easily.  Psychiatric/Behavioral: Negative for depression. The patient is not nervous/anxious and does not have insomnia.       PAST MEDICAL HISTORY :  Past Medical History:  Diagnosis Date  . Follicular lymphoma ([place]) 2014   Chemo tx's.  Marland Kitchen GERD (gastroesophageal reflux disease)   . Hypertension   . Skin cancer    R jaw - treated by Dr. Koleen Nimrod many years ago - unsure which type    PAST SURGICAL HISTORY :   Past Surgical History:  Procedure Laterality Date  . BOWEL RESECTION N/A 06/19/2016   Procedure: SMALL BOWEL RESECTION;  Surgeon: Hubbard Robinson, MD;  Location: [facility] ORS;  Service: General;  Laterality: N/A;  . CHOLECYSTECTOMY    . LAPAROSCOPIC REMOVAL OF MESENTERIC MASS N/A 06/19/2016   Procedure: LAPAROSCOPIC REMOVAL OF MESENTERIC MASS;  Surgeon: Hubbard Robinson, MD;  Location: [facility] ORS;  Service: General;  Laterality: N/A;  . LYMPH NODE DISSECTION    . PORT-A-CATH REMOVAL    . PORTACATH PLACEMENT      FAMILY HISTORY :   Family History  Problem Relation Age  of Onset  . Clotting disorder Mother   . Lymphoma Father   . Hiatal hernia Father   . Heart disease Father        stent placed  . Cancer Paternal Uncle        esophagus  . Brain cancer  Maternal Aunt     SOCIAL HISTORY:   Social History   Tobacco Use  . Smoking status: Never Smoker  . Smokeless tobacco: Never Used  Substance Use Topics  . Alcohol use: No  . Drug use: No    ALLERGIES:  has No Known Allergies.  MEDICATIONS:  Current Outpatient Medications  Medication Sig Dispense Refill  . cetirizine (ZYRTEC) 10 MG tablet Take 1 tablet by mouth daily.    . fluticasone (FLONASE) 50 MCG/ACT nasal spray Place 1 spray into both nostrils daily as needed for allergies or rhinitis.    Marland Kitchen losartan-hydrochlorothiazide (HYZAAR) 50-12.5 MG per tablet TAKE 1 TABLET DAILY.    . Magnesium 250 MG TABS Take by mouth.    . Melatonin 3 MG TABS Take 3 mg by mouth at bedtime.    . Multiple Vitamin (MULTIVITAMIN WITH MINERALS) TABS tablet Take 1 tablet by mouth daily.    . pantoprazole (PROTONIX) 40 MG tablet Take 40 mg by mouth daily.     . simvastatin (ZOCOR) 20 MG tablet Take 1 tablet by mouth daily.    . tamsulosin (FLOMAX) 0.4 MG CAPS capsule Take by mouth.     No current facility-administered medications for this visit.    PHYSICAL EXAMINATION: ECOG PERFORMANCE STATUS: 1 - Symptomatic but completely ambulatory  BP 127/77   Pulse 85   Temp 97.8 F (36.6 C) (Tympanic)   Resp 20   Ht 6\' 1"  (1.854 m)   Wt 212 lb (96.2 kg)   BMI 27.97 kg/m   Filed Weights   01/18/21 1031  Weight: 212 lb (96.2 kg)   Physical Exam HENT:     Head: Normocephalic and atraumatic.     Mouth/Throat:     Pharynx: No oropharyngeal exudate.  Eyes:     Pupils: Pupils are equal, round, and reactive to light.  Cardiovascular:     Rate and Rhythm: Normal rate and regular rhythm.  Pulmonary:     Effort: No respiratory distress.     Breath sounds: No wheezing.  Abdominal:     General: Bowel sounds are normal. There is no distension.     Palpations: Abdomen is soft. There is no mass.     Tenderness: There is no abdominal tenderness. There is no guarding or rebound.     Comments: Midabdominal  incision noted.  Well-healed.  Musculoskeletal:        General: No tenderness. Normal range of motion.     Cervical back: Normal range of motion and neck supple.  Skin:    General: Skin is warm.  Neurological:     Mental Status: He is alert and oriented to person, place, and time.  Psychiatric:        Mood and Affect: Affect normal.       LABORATORY DATA:  I have reviewed the data as listed    Component Value Date/Time   NA 138 01/18/2021 1016   NA 140 03/12/2015 1007   K 3.8 01/18/2021 1016   K 3.7 03/12/2015 1007   CL 103 01/18/2021 1016   CL 105 03/12/2015 1007   CO2 25 01/18/2021 1016   CO2 29 03/12/2015 1007   GLUCOSE 178 (H) 01/18/2021  1016   GLUCOSE 143 (H) 03/12/2015 1007   BUN 15 01/18/2021 1016   BUN 15 03/12/2015 1007   CREATININE 1.10 01/18/2021 1016   CREATININE 0.92 03/12/2015 1007   CALCIUM 8.5 (L) 01/18/2021 1016   CALCIUM 8.6 (L) 03/12/2015 1007   PROT 6.6 01/18/2021 1016   PROT 6.6 03/12/2015 1007   ALBUMIN 3.9 01/18/2021 1016   ALBUMIN 4.0 03/12/2015 1007   AST 23 01/18/2021 1016   AST 26 03/12/2015 1007   ALT 24 01/18/2021 1016   ALT 37 03/12/2015 1007   ALKPHOS 75 01/18/2021 1016   ALKPHOS 79 03/12/2015 1007   BILITOT 0.8 01/18/2021 1016   BILITOT 0.8 03/12/2015 1007   GFRNONAA >60 01/18/2021 1016   GFRNONAA >60 03/12/2015 1007   GFRAA >60 07/19/2020 1011   GFRAA >60 03/12/2015 1007    No results found for: SPEP, UPEP  Lab Results  Component Value Date   WBC 5.1 01/18/2021   NEUTROABS 3.5 01/18/2021   HGB 15.2 01/18/2021   HCT 45.9 01/18/2021   MCV 84.4 01/18/2021   PLT 208 01/18/2021      Chemistry      Component Value Date/Time   NA 138 01/18/2021 1016   NA 140 03/12/2015 1007   K 3.8 01/18/2021 1016   K 3.7 03/12/2015 1007   CL 103 01/18/2021 1016   CL 105 03/12/2015 1007   CO2 25 01/18/2021 1016   CO2 29 03/12/2015 1007   BUN 15 01/18/2021 1016   BUN 15 03/12/2015 1007   CREATININE 1.10 01/18/2021 1016    CREATININE 0.92 03/12/2015 1007      Component Value Date/Time   CALCIUM 8.5 (L) 01/18/2021 1016   CALCIUM 8.6 (L) 03/12/2015 1007   ALKPHOS 75 01/18/2021 1016   ALKPHOS 79 03/12/2015 1007   AST 23 01/18/2021 1016   AST 26 03/12/2015 1007   ALT 24 01/18/2021 1016   ALT 37 03/12/2015 1007   BILITOT 0.8 01/18/2021 1016   BILITOT 0.8 03/12/2015 1007       RADIOGRAPHIC STUDIES: I have personally reviewed the radiological images as listed and agreed with the findings in the report. No results found.   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  Currently on surveillance. AUG 24/2021- PET-multiple enlarged mesenteric lymph nodes fairly unchanged from PET scan of August 2020; one of the right side lymph node shows increased FDG activity; clinically stable. continue surviellaince; will repeat imaging in 6 months.   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  # Dysphagia: few months;intermittent liquids/solids; discussed re: esophagogram;declines. abdominal bloating- refer to GI/Dr.Toledo. await GI evaluation.   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 CT CHEST ABDOMEN PELVIS W CONTRAST    Standing Status:   Future    Standing Expiration Date:   01/18/2022    Order Specific Question:   Preferred imaging location?    Answer:   [facility]    Order Specific Question:   Radiology Contrast Protocol - do NOT remove file path    Answer:   [email or web address]  . Comprehensive metabolic panel  Standing Status:   Future    Standing Expiration Date:    01/18/2022  . CBC with Differential    Standing Status:   Future    Standing Expiration Date:   01/18/2022  . Lactate dehydrogenase    Standing Status:   Future    Standing Expiration Date:   01/18/2022  . Ambulatory referral to Gastroenterology    Referral Priority:   Routine    Referral Type:   Consultation    Referral Reason:   Specialty Services Required    Referred to Provider:   Efrain Sella, MD    Requested Specialty:   Gastroenterology    Number of Visits Requested:   1   All questions were answered. The patient knows to call the clinic with any problems, questions or concerns.      Cammie Sickle, MD 01/18/2021 12:59 PM

## 2021-01-23 ENCOUNTER — Ambulatory Visit: Payer: Medicare HMO | Admitting: Dermatology

## 2021-01-23 ENCOUNTER — Encounter: Payer: Self-pay | Admitting: Dermatology

## 2021-01-23 ENCOUNTER — Other Ambulatory Visit: Payer: Self-pay

## 2021-01-23 DIAGNOSIS — L570 Actinic keratosis: Secondary | ICD-10-CM | POA: Diagnosis not present

## 2021-01-23 DIAGNOSIS — L820 Inflamed seborrheic keratosis: Secondary | ICD-10-CM

## 2021-01-23 DIAGNOSIS — L814 Other melanin hyperpigmentation: Secondary | ICD-10-CM | POA: Diagnosis not present

## 2021-01-23 DIAGNOSIS — D1800 Hemangioma unspecified site: Secondary | ICD-10-CM

## 2021-01-23 DIAGNOSIS — Z1283 Encounter for screening for malignant neoplasm of skin: Secondary | ICD-10-CM

## 2021-01-23 DIAGNOSIS — L578 Other skin changes due to chronic exposure to nonionizing radiation: Secondary | ICD-10-CM

## 2021-01-23 DIAGNOSIS — D229 Melanocytic nevi, unspecified: Secondary | ICD-10-CM

## 2021-01-23 DIAGNOSIS — L821 Other seborrheic keratosis: Secondary | ICD-10-CM

## 2021-01-23 NOTE — Progress Notes (Signed)
   Follow-Up Visit   Subjective  Troy Reynolds is a 70 y.o. male who presents for the following: Actinic Keratosis (Left temple, left nose, face treated with LN2). The patient presents for Upper Body Skin Exam (UBSE) for skin cancer screening and mole check.  The following portions of the chart were reviewed this encounter and updated as appropriate:   Tobacco  Allergies  Meds  Problems  Med Hx  Surg Hx  Fam Hx     Review of Systems:  No other skin or systemic complaints except as noted in HPI or Assessment and Plan.  Objective  Well appearing patient in no apparent distress; mood and affect are within normal limits.  All skin waist up examined.  Objective  Back: Erythematous keratotic or waxy stuck-on papule or plaque.   Objective  Scalp x 4, right cheek/sideburn x 1, left sideburn x 1 (6): Erythematous thin papules/macules with gritty scale.    Assessment & Plan    Lentigines - Scattered tan macules - Due to sun exposure - Benign-appering, observe - Recommend daily broad spectrum sunscreen SPF 30+ to sun-exposed areas, reapply every 2 hours as needed. - Call for any changes  Seborrheic Keratoses - Stuck-on, waxy, tan-brown papules and plaques  - Discussed benign etiology and prognosis. - Observe - Call for any changes  Melanocytic Nevi - Tan-brown and/or pink-flesh-colored symmetric macules and papules - Benign appearing on exam today - Observation - Call clinic for new or changing moles - Recommend daily use of broad spectrum spf 30+ sunscreen to sun-exposed areas.   Hemangiomas - Red papules - Discussed benign nature - Observe - Call for any changes  Actinic Damage - Chronic, secondary to cumulative UV/sun exposure - diffuse scaly erythematous macules with underlying dyspigmentation - Recommend daily broad spectrum sunscreen SPF 30+ to sun-exposed areas, reapply every 2 hours as needed.  - Call for new or changing lesions.  Skin cancer screening  performed today.  Inflamed seborrheic keratosis Back  Destruction of lesion - Back Complexity: simple   Destruction method: cryotherapy   Informed consent: discussed and consent obtained   Timeout:  patient name, date of birth, surgical site, and procedure verified Lesion destroyed using liquid nitrogen: Yes   Region frozen until ice ball extended beyond lesion: Yes   Outcome: patient tolerated procedure well with no complications   Post-procedure details: wound care instructions given    AK (actinic keratosis) (6) Scalp x 4, right cheek/sideburn x 1, left sideburn x 1  Destruction of lesion - Scalp x 4, right cheek/sideburn x 1, left sideburn x 1 Complexity: simple   Destruction method: cryotherapy   Informed consent: discussed and consent obtained   Timeout:  patient name, date of birth, surgical site, and procedure verified Lesion destroyed using liquid nitrogen: Yes   Region frozen until ice ball extended beyond lesion: Yes   Outcome: patient tolerated procedure well with no complications   Post-procedure details: wound care instructions given    Skin cancer screening  Return in about 8 months (around 09/25/2021).  I, Ashok Cordia, CMA, am acting as scribe for Sarina Ser, MD .  Documentation: I have reviewed the above documentation for accuracy and completeness, and I agree with the above.  Sarina Ser, MD

## 2021-01-23 NOTE — Patient Instructions (Signed)

## 2021-07-03 ENCOUNTER — Telehealth: Payer: Self-pay

## 2021-07-03 DIAGNOSIS — I712 Thoracic aortic aneurysm, without rupture, unspecified: Secondary | ICD-10-CM

## 2021-07-03 NOTE — Telephone Encounter (Signed)
Spoke with patient to let him know Dr.Oaks has officially retired. I let him know that I have placed referral to [facility] and that someone would be calling to schedule an appointment-he was reminded to follow through with the CT scan chest later this month.

## 2021-07-17 ENCOUNTER — Ambulatory Visit
Admission: RE | Admit: 2021-07-17 | Discharge: 2021-07-17 | Disposition: A | Discharge status: Home / Self Care | Payer: Medicare HMO | Source: Ambulatory Visit | Attending: Internal Medicine | Admitting: Internal Medicine

## 2021-07-17 ENCOUNTER — Other Ambulatory Visit: Payer: Self-pay

## 2021-07-17 DIAGNOSIS — C8203 Follicular lymphoma grade I, intra-abdominal lymph nodes: Secondary | ICD-10-CM | POA: Insufficient documentation

## 2021-07-17 LAB — POCT I-STAT CREATININE: Creatinine, Ser: 1 mg/dL (ref 0.61–1.24)

## 2021-07-17 MED ORDER — IOHEXOL 350 MG/ML SOLN
100.0000 mL | Freq: Once | INTRAVENOUS | Status: AC | PRN
  Administered 2021-07-17: 100 mL via INTRAVENOUS

## 2021-07-18 ENCOUNTER — Encounter: Payer: Self-pay | Admitting: Internal Medicine

## 2021-07-18 ENCOUNTER — Inpatient Hospital Stay: Payer: Medicare HMO | Attending: Internal Medicine

## 2021-07-18 ENCOUNTER — Encounter: Payer: Self-pay | Admitting: *Deleted

## 2021-07-18 ENCOUNTER — Inpatient Hospital Stay (HOSPITAL_BASED_OUTPATIENT_CLINIC_OR_DEPARTMENT_OTHER): Payer: Medicare HMO | Admitting: Internal Medicine

## 2021-07-18 DIAGNOSIS — Z9221 Personal history of antineoplastic chemotherapy: Secondary | ICD-10-CM | POA: Insufficient documentation

## 2021-07-18 DIAGNOSIS — Z85828 Personal history of other malignant neoplasm of skin: Secondary | ICD-10-CM | POA: Diagnosis not present

## 2021-07-18 DIAGNOSIS — Z79899 Other long term (current) drug therapy: Secondary | ICD-10-CM | POA: Diagnosis not present

## 2021-07-18 DIAGNOSIS — C8203 Follicular lymphoma grade I, intra-abdominal lymph nodes: Secondary | ICD-10-CM | POA: Diagnosis not present

## 2021-07-18 DIAGNOSIS — Z923 Personal history of irradiation: Secondary | ICD-10-CM | POA: Insufficient documentation

## 2021-07-18 LAB — COMPREHENSIVE METABOLIC PANEL
ALT: 27 U/L (ref 0–44)
AST: 21 U/L (ref 15–41)
Albumin: 4.2 g/dL (ref 3.5–5.0)
Alkaline Phosphatase: 62 U/L (ref 38–126)
Anion gap: 9 (ref 5–15)
BUN: 15 mg/dL (ref 8–23)
CO2: 26 mmol/L (ref 22–32)
Calcium: 8.5 mg/dL — ABNORMAL LOW (ref 8.9–10.3)
Chloride: 103 mmol/L (ref 98–111)
Creatinine, Ser: 0.89 mg/dL (ref 0.61–1.24)
GFR, Estimated: >60 mL/min (ref >60)
Glucose, Bld: 101 mg/dL — ABNORMAL HIGH (ref 70–99)
Potassium: 3.5 mmol/L (ref 3.5–5.1)
Sodium: 138 mmol/L (ref 135–145)
Total Bilirubin: 0.8 mg/dL (ref 0.3–1.2)
Total Protein: 6.8 g/dL (ref 6.5–8.1)

## 2021-07-18 LAB — CBC WITH DIFFERENTIAL/PLATELET
Abs Immature Granulocytes: 0.05 10*3/uL (ref 0.00–0.07)
Basophils Absolute: 0.1 10*3/uL (ref 0.0–0.1)
Basophils Relative: 1 %
Eosinophils Absolute: 0.3 10*3/uL (ref 0.0–0.5)
Eosinophils Relative: 5 %
HCT: 46.7 % (ref 39.0–52.0)
Hemoglobin: 15.7 g/dL (ref 13.0–17.0)
Immature Granulocytes: 1 %
Lymphocytes Relative: 19 %
Lymphs Abs: 1 10*3/uL (ref 0.7–4.0)
MCH: 29 pg (ref 26.0–34.0)
MCHC: 33.6 g/dL (ref 30.0–36.0)
MCV: 86.3 fL (ref 80.0–100.0)
Monocytes Absolute: 0.6 10*3/uL (ref 0.1–1.0)
Monocytes Relative: 11 %
Neutro Abs: 3.2 10*3/uL (ref 1.7–7.7)
Neutrophils Relative %: 63 %
Platelets: 204 10*3/uL (ref 150–400)
RBC: 5.41 MIL/uL (ref 4.22–5.81)
RDW: 13.5 % (ref 11.5–15.5)
WBC: 5.1 10*3/uL (ref 4.0–10.5)
nRBC: 0 % (ref 0.0–0.2)

## 2021-07-18 LAB — LACTATE DEHYDROGENASE: LDH: 129 U/L (ref 98–192)

## 2021-07-18 NOTE — Assessment & Plan Note (Addendum)
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  Currently on surveillance;  CT AUG 2022- Slight enlargement of mesenteric adenopathy [by 4-5 mm].  Patient continues to be symptomatic/continue surveillance.  Repeat imaging in 12 months.  Again reviewed the potential signs and symptoms of symptomatic lymphoma.  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

## 2021-07-18 NOTE — Progress Notes (Signed)
[facility] OFFICE PROGRESS NOTE  Patient Care Team: Rusty Aus, MD as PCP - General (Internal Medicine) Hubbard Robinson, MD as Consulting Physician (Surgery) Efrain Sella, MD as Consulting Physician (Gastroenterology) Cammie Sickle, MD as Consulting Physician (Internal Medicine)  Cancer Staging No matching staging information was found for the patient.   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 Recm surviellance; Dr.Crystal.  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  No abdominal pain or nausea vomiting.  No fever chills.  No lumps or bumps.  Currently on colonoscopy prep; awaiting colonoscopy tomorrow.  Review of Systems  Constitutional:  Negative for chills, diaphoresis,  fever and weight loss.  HENT:  Negative for nosebleeds and sore throat.   Eyes:  Negative for double vision.  Respiratory:  Negative for cough, hemoptysis, sputum production, shortness of breath and wheezing.   Cardiovascular:  Negative for chest pain, palpitations, orthopnea and leg swelling.  Gastrointestinal:  Positive for constipation. Negative for abdominal pain, blood in stool, diarrhea, heartburn, melena and vomiting.       Abdominal discomfort/bloating.  Genitourinary:  Negative for dysuria, frequency and urgency.  Musculoskeletal:  Negative for back pain and joint pain.  Skin: Negative.  Negative for itching and rash.  Neurological:  Negative for dizziness, tingling, focal weakness, weakness and headaches.  Endo/Heme/Allergies:  Does not bruise/bleed easily.  Psychiatric/Behavioral:  Negative for depression. The patient is not nervous/anxious and does not have insomnia.      PAST MEDICAL HISTORY :  Past Medical History:  Diagnosis Date  . Follicular lymphoma (Troy Reynolds) 2014   Chemo tx's.  Marland Kitchen GERD (gastroesophageal reflux disease)   . Hypertension   . Skin cancer    R jaw - treated by Dr. Koleen Nimrod many years ago - unsure which type    PAST SURGICAL HISTORY :   Past Surgical History:  Procedure Laterality Date  . BOWEL RESECTION N/A 06/19/2016   Procedure: SMALL BOWEL RESECTION;  Surgeon: Hubbard Robinson, MD;  Location: [facility] ORS;  Service: General;  Laterality: N/A;  . CHOLECYSTECTOMY    . LAPAROSCOPIC REMOVAL OF MESENTERIC MASS N/A 06/19/2016   Procedure: LAPAROSCOPIC REMOVAL OF MESENTERIC MASS;  Surgeon: Hubbard Robinson, MD;  Location: [facility] ORS;  Service: General;  Laterality: N/A;  . LYMPH NODE DISSECTION    . PORT-A-CATH REMOVAL    . PORTACATH PLACEMENT      FAMILY HISTORY :  Family History  Problem Relation Age of Onset  . Clotting disorder Mother   . Lymphoma Father   . Hiatal hernia Father   . Heart disease Father        stent placed  . Cancer Paternal  Uncle        esophagus  . Brain cancer Maternal Aunt     SOCIAL HISTORY:   Social History   Tobacco Use  . Smoking status: Never  . Smokeless tobacco: Never  Substance Use Topics  . Alcohol use: No  . Drug use: No    ALLERGIES:  has No Known Allergies.  MEDICATIONS:  Current Outpatient Medications  Medication Sig Dispense Refill  . losartan-hydrochlorothiazide (HYZAAR) 50-12.5 MG per tablet TAKE 1 TABLET DAILY.    . pantoprazole (PROTONIX) 40 MG tablet Take 40 mg by mouth daily.     . simvastatin (ZOCOR) 20 MG tablet Take 1 tablet by mouth daily.    . tamsulosin (FLOMAX) 0.4 MG CAPS capsule Take 0.4 mg by mouth daily.    . cetirizine (ZYRTEC) 10 MG tablet Take 1 tablet by mouth daily. (Patient not taking: Reported on 07/18/2021)    . fluticasone (FLONASE) 50 MCG/ACT nasal spray Place 1 spray into both nostrils daily as needed for allergies or rhinitis. (Patient not taking: Reported on 07/18/2021)    . Magnesium 250 MG TABS Take by mouth. (Patient not taking: Reported on 07/18/2021)    . Melatonin 3 MG TABS Take 3 mg by mouth at bedtime. (Patient not taking: Reported on 07/18/2021)    . Multiple Vitamin (MULTIVITAMIN WITH MINERALS) TABS tablet Take 1 tablet by mouth daily. (Patient not taking: Reported on 07/18/2021)     No current facility-administered medications for this visit.    PHYSICAL EXAMINATION: ECOG PERFORMANCE STATUS: 1 - Symptomatic but completely ambulatory  BP 132/89   Pulse 76   Temp 98 F (36.7 C) (Tympanic)   Resp 16   Ht '6\' 1"'$  (1.854 m)   Wt 218 lb (98.9 kg)   SpO2 99%   BMI 28.76 kg/m   Filed Weights   07/18/21 1019  Weight: 218 lb (98.9 kg)   Physical Exam HENT:     Head: Normocephalic and atraumatic.     Mouth/Throat:     Pharynx: No oropharyngeal exudate.  Eyes:     Pupils: Pupils are equal, round, and reactive to light.  Cardiovascular:     Rate and Rhythm: Normal rate and regular rhythm.  Pulmonary:     Effort: No respiratory distress.      Breath sounds: No wheezing.  Abdominal:     General: Bowel sounds are normal. There is no distension.     Palpations: Abdomen is soft. There is no mass.     Tenderness: There is no abdominal tenderness. There is no guarding or rebound.     Comments: Midabdominal incision noted.  Well-healed.  Musculoskeletal:        General: No tenderness. Normal range of motion.     Cervical back: Normal range of motion and neck supple.  Skin:    General: Skin is warm.  Neurological:     Mental Status: He is alert and oriented to person, place, and time.  Psychiatric:        Mood and Affect: Affect normal.      LABORATORY DATA:  I have reviewed the data as listed    Component Value Date/Time   NA 138 07/18/2021 0950   NA 140 03/12/2015 1007  K 3.5 07/18/2021 0950   K 3.7 03/12/2015 1007   CL 103 07/18/2021 0950   CL 105 03/12/2015 1007   CO2 26 07/18/2021 0950   CO2 29 03/12/2015 1007   GLUCOSE 101 (H) 07/18/2021 0950   GLUCOSE 143 (H) 03/12/2015 1007   BUN 15 07/18/2021 0950   BUN 15 03/12/2015 1007   CREATININE 0.89 07/18/2021 0950   CREATININE 0.92 03/12/2015 1007   CALCIUM 8.5 (L) 07/18/2021 0950   CALCIUM 8.6 (L) 03/12/2015 1007   PROT 6.8 07/18/2021 0950   PROT 6.6 03/12/2015 1007   ALBUMIN 4.2 07/18/2021 0950   ALBUMIN 4.0 03/12/2015 1007   AST 21 07/18/2021 0950   AST 26 03/12/2015 1007   ALT 27 07/18/2021 0950   ALT 37 03/12/2015 1007   ALKPHOS 62 07/18/2021 0950   ALKPHOS 79 03/12/2015 1007   BILITOT 0.8 07/18/2021 0950   BILITOT 0.8 03/12/2015 1007   GFRNONAA >60 07/18/2021 0950   GFRNONAA >60 03/12/2015 1007   GFRAA >60 07/19/2020 1011   GFRAA >60 03/12/2015 1007    No results found for: SPEP, UPEP  Lab Results  Component Value Date   WBC 5.1 07/18/2021   NEUTROABS 3.2 07/18/2021   HGB 15.7 07/18/2021   HCT 46.7 07/18/2021   MCV 86.3 07/18/2021   PLT 204 07/18/2021      Chemistry      Component Value Date/Time   NA 138 07/18/2021 0950   NA  140 03/12/2015 1007   K 3.5 07/18/2021 0950   K 3.7 03/12/2015 1007   CL 103 07/18/2021 0950   CL 105 03/12/2015 1007   CO2 26 07/18/2021 0950   CO2 29 03/12/2015 1007   BUN 15 07/18/2021 0950   BUN 15 03/12/2015 1007   CREATININE 0.89 07/18/2021 0950   CREATININE 0.92 03/12/2015 1007      Component Value Date/Time   CALCIUM 8.5 (L) 07/18/2021 0950   CALCIUM 8.6 (L) 03/12/2015 1007   ALKPHOS 62 07/18/2021 0950   ALKPHOS 79 03/12/2015 1007   AST 21 07/18/2021 0950   AST 26 03/12/2015 1007   ALT 27 07/18/2021 0950   ALT 37 03/12/2015 1007   BILITOT 0.8 07/18/2021 0950   BILITOT 0.8 03/12/2015 1007       RADIOGRAPHIC STUDIES: I have personally reviewed the radiological images as listed and agreed with the findings in the report. CT CHEST ABDOMEN PELVIS W CONTRAST  Result Date: 07/18/2021 CLINICAL DATA:  Follicular lymphoma. EXAM: CT CHEST, ABDOMEN, AND PELVIS WITH CONTRAST TECHNIQUE: Multidetector CT imaging of the chest, abdomen and pelvis was performed following the standard protocol during bolus administration of intravenous contrast. CONTRAST:  135m OMNIPAQUE IOHEXOL 350 MG/ML SOLN COMPARISON:  PET 07/17/2020 and CT abdomen pelvis 01/17/2020. FINDINGS: CT CHEST FINDINGS Cardiovascular: Atherosclerotic calcification of the aorta. Ascending aorta measures 4.1 cm, stable. Heart is enlarged. No pericardial effusion. Mediastinum/Nodes: No pathologically enlarged mediastinal, hilar or axillary lymph nodes. Esophagus is unremarkable. Moderate hiatal hernia. Lungs/Pleura: Biapical pleuroparenchymal scarring. Scattered bibasilar scarring. Mild central bronchiectasis. No suspicious pulmonary nodules. No pleural fluid. Airway is unremarkable. Musculoskeletal: Degenerative changes in the spine. No worrisome lytic or sclerotic lesions. CT ABDOMEN PELVIS FINDINGS Hepatobiliary: Liver may be slightly decreased in attenuation diffusely. Cholecystectomy. No biliary ductal dilatation. Pancreas:  Negative. Spleen: 10 mm low-attenuation lesion in the peripheral spleen, unchanged from 01/17/2020 and likely benign. Otherwise unremarkable. Adrenals/Urinary Tract: Adrenal glands and right kidney are unremarkable. Low-attenuation lesions in the left kidney measure up to 5.2 cm,  compatible with cysts. Stones in the left kidney. Ureters are decompressed. Bladder is grossly unremarkable. Stomach/Bowel: Moderate hiatal hernia. Duodenal diverticula. Stomach and majority of the small bowel are otherwise unremarkable. Difficult to exclude distal/terminal ileal wall thickening. Appendix is not visualized. Colon is unremarkable. Vascular/Lymphatic: Atherosclerotic calcification of the aorta. Retroperitoneal lymph nodes are subcentimeter in short axis size, as before. Small bowel mesenteric lymph nodes measure up to 2.0 x 3.2 cm in the ileocolic mesentery (99991111), enlarged from 1.8 x 2.2 cm on 01/17/2020. A second index mesenteric lymph node in the left paramidline measures 1.4 cm (2/82), previously 9 mm. Reproductive: Prostate is enlarged. Other: Bilateral inguinal hernias contain fat.  No free fluid. Musculoskeletal: Degenerative changes in the spine. IMPRESSION: 1. Slight enlargement of mesenteric adenopathy. 2. Difficult to exclude distal/terminal ileal wall thickening. 3. Ascending aortic aneurysm, stable. Recommend annual imaging followup by CTA or MRA. This recommendation follows 2010 ACCF/AHA/AATS/ACR/ASA/SCA/SCAI/SIR/STS/SVM Guidelines for the Diagnosis and Management of Patients with Thoracic Aortic Disease. Circulation. 2010; [id number]. Aortic aneurysm NOS (ICD10-I71.9). 4. Liver appears slightly steatotic. 5. Left renal stones. 6. Moderate hiatal hernia. 7. Enlarged prostate. 8.  Aortic atherosclerosis (ICD10-I70.0). Electronically Signed   By: Lorin Picket M.D.   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  Currently on surveillance;  CT AUG 2022- Slight enlargement of mesenteric adenopathy [by 4-5 mm].  Patient continues to be symptomatic/continue surveillance.  Repeat imaging in 12 months.  Again reviewed the potential signs and symptoms of symptomatic lymphoma.  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       Orders Placed This Encounter  Procedures  . CBC with Differential    Standing Status:   Future    Standing Expiration Date:   07/18/2022  . Comprehensive metabolic panel    Standing Status:   Future    Standing Expiration Date:   07/18/2022  . Lactate dehydrogenase    Standing Status:   Future    Standing Expiration Date:   07/18/2022   All questions were answered. The patient knows to call the clinic with any problems, questions or concerns.      Cammie Sickle, MD 07/18/2021 12:25 PM

## 2021-07-19 ENCOUNTER — Encounter
Admission: RE | Disposition: A | Discharge status: Home / Self Care | Payer: Self-pay | Source: Home / Self Care | Attending: Gastroenterology

## 2021-07-19 ENCOUNTER — Encounter: Payer: Self-pay | Admitting: *Deleted

## 2021-07-19 ENCOUNTER — Ambulatory Visit: Payer: Medicare HMO | Admitting: Internal Medicine

## 2021-07-19 ENCOUNTER — Ambulatory Visit
Admission: RE | Admit: 2021-07-19 | Discharge: 2021-07-19 | Disposition: A | Discharge status: Home / Self Care | Payer: Medicare HMO | Attending: Gastroenterology | Admitting: Gastroenterology

## 2021-07-19 ENCOUNTER — Ambulatory Visit: Payer: Medicare HMO | Admitting: Anesthesiology

## 2021-07-19 ENCOUNTER — Other Ambulatory Visit: Payer: Medicare HMO

## 2021-07-19 DIAGNOSIS — Z79899 Other long term (current) drug therapy: Secondary | ICD-10-CM | POA: Diagnosis not present

## 2021-07-19 DIAGNOSIS — Z8572 Personal history of non-Hodgkin lymphomas: Secondary | ICD-10-CM | POA: Insufficient documentation

## 2021-07-19 DIAGNOSIS — K640 First degree hemorrhoids: Secondary | ICD-10-CM | POA: Insufficient documentation

## 2021-07-19 DIAGNOSIS — K219 Gastro-esophageal reflux disease without esophagitis: Secondary | ICD-10-CM | POA: Diagnosis not present

## 2021-07-19 DIAGNOSIS — Z9221 Personal history of antineoplastic chemotherapy: Secondary | ICD-10-CM | POA: Insufficient documentation

## 2021-07-19 DIAGNOSIS — G20 Parkinson's disease: Secondary | ICD-10-CM | POA: Insufficient documentation

## 2021-07-19 DIAGNOSIS — K449 Diaphragmatic hernia without obstruction or gangrene: Secondary | ICD-10-CM | POA: Diagnosis not present

## 2021-07-19 DIAGNOSIS — D124 Benign neoplasm of descending colon: Secondary | ICD-10-CM | POA: Insufficient documentation

## 2021-07-19 DIAGNOSIS — K317 Polyp of stomach and duodenum: Secondary | ICD-10-CM | POA: Insufficient documentation

## 2021-07-19 DIAGNOSIS — K222 Esophageal obstruction: Secondary | ICD-10-CM | POA: Insufficient documentation

## 2021-07-19 DIAGNOSIS — R1310 Dysphagia, unspecified: Secondary | ICD-10-CM | POA: Insufficient documentation

## 2021-07-19 DIAGNOSIS — Z8719 Personal history of other diseases of the digestive system: Secondary | ICD-10-CM | POA: Diagnosis present

## 2021-07-19 HISTORY — PX: COLONOSCOPY WITH PROPOFOL: SHX5780

## 2021-07-19 HISTORY — PX: ESOPHAGOGASTRODUODENOSCOPY (EGD) WITH PROPOFOL: SHX5813

## 2021-07-19 HISTORY — DX: Crohn's disease, unspecified, without complications: K50.90

## 2021-07-19 HISTORY — DX: Parkinson's disease: G20

## 2021-07-19 HISTORY — DX: Sleep apnea, unspecified: G47.30

## 2021-07-19 HISTORY — DX: Calculus of kidney: N20.0

## 2021-07-19 HISTORY — DX: Polyp of colon: K63.5

## 2021-07-19 HISTORY — DX: Parkinson's disease without dyskinesia, without mention of fluctuations: G20.A1

## 2021-07-19 SURGERY — ESOPHAGOGASTRODUODENOSCOPY (EGD) WITH PROPOFOL
Anesthesia: General

## 2021-07-19 MED ORDER — PHENYLEPHRINE HCL (PRESSORS) 10 MG/ML IV SOLN: INTRAVENOUS | Status: DC | PRN

## 2021-07-19 MED ORDER — SODIUM CHLORIDE 0.9 % IV SOLN
INTRAVENOUS | Status: DC
  Administered 2021-07-19: 1000 mL via INTRAVENOUS

## 2021-07-19 MED ORDER — LIDOCAINE HCL (PF) 2 % IJ SOLN
INTRAMUSCULAR | Status: DC | PRN
  Administered 2021-07-19: 100 mg via INTRADERMAL

## 2021-07-19 MED ORDER — PHENYLEPHRINE HCL (PRESSORS) 10 MG/ML IV SOLN
INTRAVENOUS | Status: DC | PRN
  Administered 2021-07-19 (×4): 100 ug via INTRAVENOUS

## 2021-07-19 MED ORDER — PROPOFOL 500 MG/50ML IV EMUL
INTRAVENOUS | Status: DC | PRN
  Administered 2021-07-19: 200 ug/kg/min via INTRAVENOUS

## 2021-07-19 MED ORDER — PROPOFOL 10 MG/ML IV BOLUS
INTRAVENOUS | Status: DC | PRN
  Administered 2021-07-19: 10 mg via INTRAVENOUS
  Administered 2021-07-19: 70 mg via INTRAVENOUS

## 2021-07-19 NOTE — Anesthesia Postprocedure Evaluation (Signed)
Anesthesia Post Note  Patient: Troy Reynolds  Procedure(s) Performed: ESOPHAGOGASTRODUODENOSCOPY (EGD) WITH PROPOFOL COLONOSCOPY WITH PROPOFOL  Patient location during evaluation: Phase II Anesthesia Type: General Level of consciousness: awake and alert, awake and oriented Pain management: pain level controlled Vital Signs Assessment: post-procedure vital signs reviewed and stable Respiratory status: spontaneous breathing, nonlabored ventilation and respiratory function stable Cardiovascular status: blood pressure returned to baseline and stable Postop Assessment: no apparent nausea or vomiting Anesthetic complications: no   No notable events documented.   Last Vitals:  Vitals:   07/19/21 0912 07/19/21 0920  BP: (!) 85/65 109/76  Pulse: (!) 54 (!) 58  Resp: 15 17  Temp:    SpO2: 92% 98%    Last Pain:  Vitals:   07/19/21 0920  TempSrc:   PainSc: 0-No pain                 Phill Mutter

## 2021-07-19 NOTE — H&P (Signed)
Outpatient short stay form Pre-procedure 07/19/2021  Lesly Rubenstein, MD  Primary Physician: Rusty Aus, MD  Reason for visit:  Dysphagia/Possible history of crohns  History of present illness:   70 y/o gentleman with history of b-cell lymphoma and questionable history of crohns based on non-specific biopsies. He is not on crohns medicines and doing well otherwise. Has a difficult time describing swallowing issues, it's infrequent. No blood thinners. No family history of GI issues. Had abdominal surgery for mesenteric mass that turned out to be lymphoma.    Current Facility-Administered Medications:    0.9 %  sodium chloride infusion, , Intravenous, Continuous, Xela Oregel, Hilton Cork, MD, Last Rate: 20 mL/hr at 07/19/21 0806, 1,000 mL at 07/19/21 0806  Medications Prior to Admission  Medication Sig Dispense Refill Last Dose   losartan-hydrochlorothiazide (HYZAAR) 50-12.5 MG per tablet TAKE 1 TABLET DAILY.   07/19/2021 at 0600   Multiple Vitamin (MULTIVITAMIN WITH MINERALS) TABS tablet Take 1 tablet by mouth daily.   Past Week   pantoprazole (PROTONIX) 40 MG tablet Take 40 mg by mouth daily.    07/19/2021 at 0600   Probiotic Product ([place]) Take by mouth daily.   07/18/2021   simvastatin (ZOCOR) 20 MG tablet Take 1 tablet by mouth daily.   07/18/2021   tamsulosin (FLOMAX) 0.4 MG CAPS capsule Take 0.4 mg by mouth daily.   07/18/2021   cetirizine (ZYRTEC) 10 MG tablet Take 1 tablet by mouth daily. (Patient not taking: Reported on 07/18/2021)      fluticasone (FLONASE) 50 MCG/ACT nasal spray Place 1 spray into both nostrils daily as needed for allergies or rhinitis. (Patient not taking: Reported on 07/18/2021)      levothyroxine (SYNTHROID) 50 MCG tablet Take 50 mcg by mouth daily before breakfast. (Patient not taking: Reported on 07/19/2021)   Not Taking at 0600   Magnesium 250 MG TABS Take by mouth. (Patient not taking: Reported on 07/18/2021)      Melatonin 3 MG TABS Take 3 mg  by mouth at bedtime. (Patient not taking: Reported on 07/18/2021)        No Known Allergies   Past Medical History:  Diagnosis Date   Colon polyp, hyperplastic    Crohn's disease ([place])    Follicular lymphoma ([place]) 2014   Chemo tx's.   GERD (gastroesophageal reflux disease)    Hypertension    Kidney stones    Primary Parkinson's disease ([place])    Skin cancer    R jaw - treated by Dr. Koleen Nimrod many years ago - unsure which type   Sleep apnea     Review of systems:  Otherwise negative.    Physical Exam  Gen: Alert, oriented. Appears stated age.  HEENT: PERRLA. Lungs: No respiratory distress CV: RRR Abd: soft, benign, no masses Ext: No edema    Planned procedures: Proceed with EGD/colonoscopy. The patient understands the nature of the planned procedure, indications, risks, alternatives and potential complications including but not limited to bleeding, infection, perforation, damage to internal organs and possible oversedation/side effects from anesthesia. The patient agrees and gives consent to proceed.  Please refer to procedure notes for findings, recommendations and patient disposition/instructions.     Lesly Rubenstein, MD [facility] Gastroenterology

## 2021-07-19 NOTE — Op Note (Signed)
[facility] Gastroenterology Patient Name: Troy Reynolds Procedure Date: 07/19/2021 8:16 AM MRN: [id number] Account #: [id number] Date of Birth: 1951-03-27 Admit Type: Outpatient Age: 70 Room: [facility] ENDO ROOM 3 Gender: Male Note Status: Finalized Procedure:             Colonoscopy Indications:           Personal history of inflammatory bowel disease Providers:             Andrey Farmer MD, MD Referring MD:          Rusty Aus, MD (Referring MD) Medicines:             Monitored Anesthesia Care Complications:         No immediate complications. Estimated blood loss:                         Minimal. Procedure:             Pre-Anesthesia Assessment:                        - Prior to the procedure, a History and Physical was                         performed, and patient medications and allergies were                         reviewed. The patient is competent. The risks and                         benefits of the procedure and the sedation options and                         risks were discussed with the patient. All questions                         were answered and informed consent was obtained.                         Patient identification and proposed procedure were                         verified by the physician, the nurse, the anesthetist                         and the technician in the endoscopy suite. Mental                         Status Examination: alert and oriented. Airway                         Examination: normal oropharyngeal airway and neck                         mobility. Respiratory Examination: clear to                         auscultation. CV Examination: normal. Prophylactic  Antibiotics: The patient does not require prophylactic                         antibiotics. Prior Anticoagulants: The patient has                         taken no previous anticoagulant or antiplatelet                         agents. ASA  Grade Assessment: II - A patient with mild                         systemic disease. After reviewing the risks and                         benefits, the patient was deemed in satisfactory                         condition to undergo the procedure. The anesthesia                         plan was to use monitored anesthesia care (MAC).                         Immediately prior to administration of medications,                         the patient was re-assessed for adequacy to receive                         sedatives. The heart rate, respiratory rate, oxygen                         saturations, blood pressure, adequacy of pulmonary                         ventilation, and response to care were monitored                         throughout the procedure. The physical status of the                         patient was re-assessed after the procedure.                        After obtaining informed consent, the colonoscope was                         passed under direct vision. Throughout the procedure,                         the patient's blood pressure, pulse, and oxygen                         saturations were monitored continuously. The                         Colonoscope was introduced through the anus and  advanced to the the terminal ileum. The colonoscopy                         was performed without difficulty. The patient                         tolerated the procedure well. The quality of the bowel                         preparation was adequate to identify polyps. Findings:      The perianal and digital rectal examinations were normal.      The terminal ileum appeared normal.      A 3 mm polyp was found in the descending colon. The polyp was sessile.       The polyp was removed with a cold snare. Resection and retrieval were       complete. Estimated blood loss was minimal.      Internal hemorrhoids were found during retroflexion. The hemorrhoids       were  Grade I (internal hemorrhoids that do not prolapse).      The exam was otherwise without abnormality on direct and retroflexion       views. Impression:            - The examined portion of the ileum was normal.                        - One 3 mm polyp in the descending colon, removed with                         a cold snare. Resected and retrieved.                        - Internal hemorrhoids.                        - The examination was otherwise normal on direct and                         retroflexion views. Recommendation:        - Discharge patient to home.                        - Resume previous diet.                        - Continue present medications.                        - Await pathology results.                        - Repeat colonoscopy for surveillance based on                         pathology results.                        - Return to referring physician as previously                         scheduled. Procedure  Code(s):     --- Professional ---                        [phone], Colonoscopy, flexible; with removal of                         tumor(s), polyp(s), or other lesion(s) by snare                         technique Diagnosis Code(s):     --- Professional ---                        K64.0, First degree hemorrhoids                        K63.5, Polyp of colon                        Z87.19, Personal history of other diseases of the                         digestive system CPT copyright 2019 American Medical Association. All rights reserved. The codes documented in this report are preliminary and upon coder review may  be revised to meet current compliance requirements. Andrey Farmer MD, MD 07/19/2021 9:19:29 AM Number of Addenda: 0 Note Initiated On: 07/19/2021 8:16 AM Scope Withdrawal Time: 0 hours 10 minutes 40 seconds  Total Procedure Duration: 0 hours 18 minutes 20 seconds  Estimated Blood Loss:  Estimated blood loss was minimal.      [facility]

## 2021-07-19 NOTE — Op Note (Signed)
[facility] Gastroenterology Patient Name: Troy Reynolds Procedure Date: 07/19/2021 8:17 AM MRN: [id number] Account #: [id number] Date of Birth: 28-Jan-1951 Admit Type: Outpatient Age: 70 Room: [facility] ENDO ROOM 3 Gender: Male Note Status: Finalized Procedure:             Upper GI endoscopy Indications:           Dysphagia Providers:             Andrey Farmer MD, MD Referring MD:          Rusty Aus, MD (Referring MD) Medicines:             Propofol per Anesthesia, Monitored Anesthesia Care Complications:         No immediate complications. Estimated blood loss:                         Minimal. Procedure:             Pre-Anesthesia Assessment:                        - Prior to the procedure, a History and Physical was                         performed, and patient medications and allergies were                         reviewed. The patient is competent. The risks and                         benefits of the procedure and the sedation options and                         risks were discussed with the patient. All questions                         were answered and informed consent was obtained.                         Patient identification and proposed procedure were                         verified by the physician, the nurse, the anesthetist                         and the technician in the endoscopy suite. Mental                         Status Examination: alert and oriented. Airway                         Examination: normal oropharyngeal airway and neck                         mobility. Respiratory Examination: clear to                         auscultation. CV Examination: normal. Prophylactic  Antibiotics: The patient does not require prophylactic                         antibiotics. Prior Anticoagulants: The patient has                         taken no previous anticoagulant or antiplatelet                         agents. ASA Grade  Assessment: II - A patient with mild                         systemic disease. After reviewing the risks and                         benefits, the patient was deemed in satisfactory                         condition to undergo the procedure. The anesthesia                         plan was to use monitored anesthesia care (MAC).                         Immediately prior to administration of medications,                         the patient was re-assessed for adequacy to receive                         sedatives. The heart rate, respiratory rate, oxygen                         saturations, blood pressure, adequacy of pulmonary                         ventilation, and response to care were monitored                         throughout the procedure. The physical status of the                         patient was re-assessed after the procedure.                        After obtaining informed consent, the endoscope was                         passed under direct vision. Throughout the procedure,                         the patient's blood pressure, pulse, and oxygen                         saturations were monitored continuously. The Endoscope                         was introduced through the mouth, and advanced to the  second part of duodenum. The upper GI endoscopy was                         accomplished without difficulty. The patient tolerated                         the procedure well. Findings:      A 6 cm hiatal hernia was present.      A widely patent Schatzki ring was found in the lower third of the       esophagus. A TTS dilator was passed through the scope. Dilation with a       15-16.5-18 mm balloon dilator was performed to 18 mm. The dilation site       was examined and showed no change. Estimated blood loss: none.      Normal mucosa was found in the entire esophagus. Biopsies were obtained       from the proximal and distal esophagus with cold forceps for  histology       of suspected eosinophilic esophagitis.      Multiple 2 to 13 mm pedunculated and sessile fundic gland polyps with no       bleeding and no stigmata of recent bleeding were found in the gastric       body. Biopsies were taken with a cold forceps for histology. Estimated       blood loss was minimal.      The exam of the stomach was otherwise normal.      The examined duodenum was normal. Impression:            - 6 cm hiatal hernia.                        - Widely patent Schatzki ring. Dilated.                        - Normal mucosa was found in the entire esophagus.                         Biopsied.                        - Multiple fundic gland polyps. Biopsied.                        - Normal examined duodenum. Recommendation:        - Discharge patient to home.                        - Resume previous diet.                        - Continue present medications.                        - Await pathology results.                        - Return to referring physician as previously                         scheduled. Procedure Code(s):     --- Professional ---  [id number], Esophagogastroduodenoscopy, flexible,                         transoral; with transendoscopic balloon dilation of                         esophagus (less than 30 mm diameter) Diagnosis Code(s):     --- Professional ---                        K44.9, Diaphragmatic hernia without obstruction or                         gangrene                        K22.2, Esophageal obstruction                        K31.7, Polyp of stomach and duodenum                        R13.10, Dysphagia, unspecified CPT copyright 2019 American Medical Association. All rights reserved. The codes documented in this report are preliminary and upon coder review may  be revised to meet current compliance requirements. Andrey Farmer MD, MD 07/19/2021 9:12:48 AM Number of Addenda: 0 Note Initiated On: 07/19/2021 8:17  AM Estimated Blood Loss:  Estimated blood loss was minimal.      [facility]

## 2021-07-19 NOTE — Transfer of Care (Signed)
Immediate Anesthesia Transfer of Care Note  Patient: Troy Reynolds  Procedure(s) Performed: ESOPHAGOGASTRODUODENOSCOPY (EGD) WITH PROPOFOL COLONOSCOPY WITH PROPOFOL  Patient Location: PACU  Anesthesia Type:General  Level of Consciousness: sedated  Airway & Oxygen Therapy: Patient Spontanous Breathing and Patient connected to nasal cannula oxygen  Post-op Assessment: Report given to RN and Post -op Vital signs reviewed and stable  Post vital signs: Reviewed and stable  Last Vitals:  Vitals Value Taken Time  BP 85/65 07/19/21 0911  Temp 35.9 C 07/19/21 0910  Pulse 59 07/19/21 0912  Resp 22 07/19/21 0912  SpO2 90 % 07/19/21 0912  Vitals shown include unvalidated device data.  Last Pain:  Vitals:   07/19/21 0910  TempSrc: Temporal  PainSc: Asleep         Complications: No notable events documented.

## 2021-07-19 NOTE — Anesthesia Preprocedure Evaluation (Signed)
Anesthesia Evaluation  Patient identified by MRN, date of birth, ID band Patient awake    Reviewed: Allergy & Precautions, NPO status , Patient's Chart, lab work & pertinent test results  Airway Mallampati: II  TM Distance: >3 FB Neck ROM: Full    Dental no notable dental hx.    Pulmonary sleep apnea and Continuous Positive Airway Pressure Ventilation ,    Pulmonary exam normal        Cardiovascular hypertension, Pt. on medications Normal cardiovascular exam+ dysrhythmias (RBBB)      Neuro/Psych negative neurological ROS  negative psych ROS   GI/Hepatic Neg liver ROS, Bowel prep,GERD  Medicated,  Endo/Other  Hypothyroidism   Renal/GU Renal disease  negative genitourinary   Musculoskeletal negative musculoskeletal ROS (+)   Abdominal   Peds negative pediatric ROS (+)  Hematology negative hematology ROS (+)   Anesthesia Other Findings . B-cell lymphoma (CMS-HCC)  . Essential hypertension  . Crohn's disease of colon without complication (CMS-HCC)  . Bundle branch block, right  . 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 OSA on CPAP  . RBD (REM behavioral disorder)  . History of 2019 novel coronavirus disease (COVID-19)  . Medicare annual wellness visit, initial  . Follicular lymphoma (CMS-HCC)  . Thoracic aortic aneurysm without rupture (CMS-HCC)  . Hyperlipidemia, mixed  . Aortic atherosclerosis (CMS-HCC)  . Primary Parkinson's disease (CMS-HCC)     Reproductive/Obstetrics negative OB ROS                             Anesthesia Physical Anesthesia Plan  ASA: 3  Anesthesia Plan: General   Post-op Pain Management:    Induction: Intravenous  PONV Risk Score and Plan: 2 and Propofol infusion and TIVA  Airway Management Planned: Natural Airway and Nasal Cannula  Additional Equipment:   Intra-op Plan:   Post-operative Plan:   Informed Consent: I have reviewed  the patients History and Physical, chart, labs and discussed the procedure including the risks, benefits and alternatives for the proposed anesthesia with the patient or authorized representative who has indicated his/her understanding and acceptance.       Plan Discussed with: CRNA, Anesthesiologist and Surgeon  Anesthesia Plan Comments:         Anesthesia Quick Evaluation

## 2021-07-19 NOTE — Interval H&P Note (Signed)
History and Physical Interval Note:  07/19/2021 8:23 AM  Troy Reynolds  has presented today for surgery, with the diagnosis of GERD,CROHN'S DISEASE.  The various methods of treatment have been discussed with the patient and family. After consideration of risks, benefits and other options for treatment, the patient has consented to  Procedure(s): ESOPHAGOGASTRODUODENOSCOPY (EGD) WITH PROPOFOL (N/A) COLONOSCOPY WITH PROPOFOL (N/A) as a surgical intervention.  The patient's history has been reviewed, patient examined, no change in status, stable for surgery.  I have reviewed the patient's chart and labs.  Questions were answered to the patient's satisfaction.     Lesly Rubenstein  Ok to proceed with EGD/Colonoscopy

## 2021-07-20 NOTE — Progress Notes (Signed)
Voicemail.  No Message Left. 

## 2021-07-22 ENCOUNTER — Encounter: Payer: Self-pay | Admitting: Gastroenterology

## 2021-07-22 LAB — SURGICAL PATHOLOGY

## 2021-07-25 ENCOUNTER — Institutional Professional Consult (permissible substitution): Payer: Medicare HMO | Admitting: Physician Assistant

## 2021-07-25 ENCOUNTER — Other Ambulatory Visit: Payer: Self-pay

## 2021-07-25 VITALS — BP 159/99 | HR 91 | Resp 20 | Ht 73.0 in | Wt 206.0 lb

## 2021-07-25 DIAGNOSIS — I712 Thoracic aortic aneurysm, without rupture, unspecified: Secondary | ICD-10-CM

## 2021-07-25 NOTE — Progress Notes (Signed)
[address]       [place] [address]             [phone]        PCP is Rusty Aus, MD Referring Provider is Pabon, Marjory Lies, MD    HPI: Troy Reynolds is a 70 y.o. male patient with a PMH significant for hypertension and a history of lymphoma.  He was diagnosed with a laparotomy for a bowel and mesenteric mass.  He has been followed by Dr. Rogue Bussing for several years and has had multiple scans made over those years.  Recently he is been found to have an ascending aortic aneurysm measuring about 4.2 cm and was followed by Dr. Genevive Bi.  He comes in today for surveillance of his ascending aortic aneurysm. He takes losartan and hydrochlorothiazide for his hypertension.   He is on a statin and does not have any genetic disorders that he is aware of.   Past Medical History:  Diagnosis Date   Colon polyp, hyperplastic    Crohn's disease ([place])    Follicular lymphoma ([place]) 2014   Chemo tx's.   GERD (gastroesophageal reflux disease)    Hypertension    Kidney stones    Primary Parkinson's disease ([place])    Skin cancer    R jaw - treated by Dr. Koleen Nimrod many years ago - unsure which type   Sleep apnea     Past Surgical History:  Procedure Laterality Date   BOWEL RESECTION N/A 06/19/2016   Procedure: SMALL BOWEL RESECTION;  Surgeon: Hubbard Robinson, MD;  Location: [facility] ORS;  Service: General;  Laterality: N/A;   CHOLECYSTECTOMY     COLONOSCOPY     COLONOSCOPY WITH PROPOFOL N/A 07/19/2021   Procedure: COLONOSCOPY WITH PROPOFOL;  Surgeon: Lesly Rubenstein, MD;  Location: [facility] ENDOSCOPY;  Service: Endoscopy;  Laterality: N/A;   ESOPHAGOGASTRODUODENOSCOPY     ESOPHAGOGASTRODUODENOSCOPY (EGD) WITH PROPOFOL N/A 07/19/2021   Procedure: ESOPHAGOGASTRODUODENOSCOPY (EGD) WITH PROPOFOL;  Surgeon: Lesly Rubenstein, MD;  Location: [facility] ENDOSCOPY;  Service: Endoscopy;  Laterality: N/A;   LAPAROSCOPIC REMOVAL OF MESENTERIC MASS N/A 06/19/2016   Procedure:  LAPAROSCOPIC REMOVAL OF MESENTERIC MASS;  Surgeon: Hubbard Robinson, MD;  Location: [facility] ORS;  Service: General;  Laterality: N/A;   LYMPH NODE DISSECTION     PORT-A-CATH REMOVAL     PORTACATH PLACEMENT      Family History  Problem Relation Age of Onset   Clotting disorder Mother    Pulmonary embolism Mother    Lymphoma Father    Hiatal hernia Father    Heart disease Father        stent placed   Hypertension Father    Ulcers Father    Brain cancer Maternal Aunt    Cancer Paternal Uncle        esophagus    Social History Social History   Tobacco Use   Smoking status: Never   Smokeless tobacco: Never  Vaping Use   Vaping Use: Never used  Substance Use Topics   Alcohol use: No   Drug use: No    Current Outpatient Medications  Medication Sig Dispense Refill   cetirizine (ZYRTEC) 10 MG tablet Take 1 tablet by mouth daily.     fluticasone (FLONASE) 50 MCG/ACT nasal spray Place 1 spray into both nostrils daily as needed for allergies or rhinitis.     losartan-hydrochlorothiazide (HYZAAR) 50-12.5 MG per tablet TAKE 1 TABLET DAILY.  Multiple Vitamin (MULTIVITAMIN WITH MINERALS) TABS tablet Take 1 tablet by mouth daily.     pantoprazole (PROTONIX) 40 MG tablet Take 40 mg by mouth daily.      Probiotic Product ([place]) Take by mouth daily.     simvastatin (ZOCOR) 20 MG tablet Take 1 tablet by mouth daily.     tamsulosin (FLOMAX) 0.4 MG CAPS capsule Take 0.4 mg by mouth daily.     levothyroxine (SYNTHROID) 50 MCG tablet Take 50 mcg by mouth daily before breakfast. (Patient not taking: Reported on 07/19/2021)     Magnesium 250 MG TABS Take by mouth. (Patient not taking: Reported on 07/18/2021)     No current facility-administered medications for this visit.    No Known Allergies  Review of Systems: No shortness of breath No chest pain + palpitations at night  BP (!) 159/99 (BP Location: Left Arm, Patient Position: Sitting)   Pulse 91   Resp 20   Ht  '6\' 1"'$  (1.854 m)   Wt 206 lb (93.4 kg)   SpO2 96% Comment: RA  BMI 27.18 kg/m   Physical Exam: General: well-appearing Neck: no JVD or carotid bruit Cor: RRR, no murmur Pulm: CTA bilaterally and in all fields Abd: no tenderness Ext: no edema  Diagnostic Tests:  CLINICAL DATA:  Follicular lymphoma.   EXAM: CT CHEST, ABDOMEN, AND PELVIS WITH CONTRAST   TECHNIQUE: Multidetector CT imaging of the chest, abdomen and pelvis was performed following the standard protocol during bolus administration of intravenous contrast.   CONTRAST:  167m OMNIPAQUE IOHEXOL 350 MG/ML SOLN   COMPARISON:  PET 07/17/2020 and CT abdomen pelvis 01/17/2020.   FINDINGS: CT CHEST FINDINGS   Cardiovascular: Atherosclerotic calcification of the aorta. Ascending aorta measures 4.1 cm, stable. Heart is enlarged. No pericardial effusion.   Mediastinum/Nodes: No pathologically enlarged mediastinal, hilar or axillary lymph nodes. Esophagus is unremarkable. Moderate hiatal hernia.   Lungs/Pleura: Biapical pleuroparenchymal scarring. Scattered bibasilar scarring. Mild central bronchiectasis. No suspicious pulmonary nodules. No pleural fluid. Airway is unremarkable.   Musculoskeletal: Degenerative changes in the spine. No worrisome lytic or sclerotic lesions.   CT ABDOMEN PELVIS FINDINGS   Hepatobiliary: Liver may be slightly decreased in attenuation diffusely. Cholecystectomy. No biliary ductal dilatation.   Pancreas: Negative.   Spleen: 10 mm low-attenuation lesion in the peripheral spleen, unchanged from 01/17/2020 and likely benign. Otherwise unremarkable.   Adrenals/Urinary Tract: Adrenal glands and right kidney are unremarkable. Low-attenuation lesions in the left kidney measure up to 5.2 cm, compatible with cysts. Stones in the left kidney. Ureters are decompressed. Bladder is grossly unremarkable.   Stomach/Bowel: Moderate hiatal hernia. Duodenal diverticula. Stomach and majority of the  small bowel are otherwise unremarkable. Difficult to exclude distal/terminal ileal wall thickening. Appendix is not visualized. Colon is unremarkable.   Vascular/Lymphatic: Atherosclerotic calcification of the aorta. Retroperitoneal lymph nodes are subcentimeter in short axis size, as before. Small bowel mesenteric lymph nodes measure up to 2.0 x 3.2 cm in the ileocolic mesentery ([id number], enlarged from 1.8 x 2.2 cm on 01/17/2020. A second index mesenteric lymph node in the left paramidline measures 1.4 cm (2/82), previously 9 mm.   Reproductive: Prostate is enlarged.   Other: Bilateral inguinal hernias contain fat.  No free fluid.   Musculoskeletal: Degenerative changes in the spine.   IMPRESSION: 1. Slight enlargement of mesenteric adenopathy. 2. Difficult to exclude distal/terminal ileal wall thickening. 3. Ascending aortic aneurysm, stable. Recommend annual imaging followup by CTA or MRA. This recommendation follows 2010 ACCF/AHA/AATS/ACR/ASA/SCA/SCAI/SIR/STS/SVM  Guidelines for the Diagnosis and Management of Patients with Thoracic Aortic Disease. Circulation. 2010; [id number]. Aortic aneurysm NOS (ICD10-I71.9). 4. Liver appears slightly steatotic. 5. Left renal stones. 6. Moderate hiatal hernia. 7. Enlarged prostate. 8.  Aortic atherosclerosis (ICD10-I70.0).     Electronically Signed   By: Lorin Picket M.D.   On: 07/18/2021 08:52    Impression: This is a 70 year old patient who was previously followed by Dr. Genevive Bi for his ascending aortic aneurysm. It was previously measured at 4.2 and on his recent CTA it was measured at 4.1. He isn't close to the threshold for possible surgical intervention.   Plan: I would recommend an annual CTA to follow-up on his ascending aortic aneurysm. I think he is a low-risk patient but need to continue tight BP control. At home he runs in the 120s per patient. We can coordinate CT scan with oncology like they have done this time.  Continue statin therapy. When he follows-up with Dr. Clayborn Bigness at [facility] clinic in November he will need an Echocardiogram.   Continue a healthy low-sodium diet with moderate exercise 3x a week.   I spent 30 minutes with the patient.    Elgie Collard, PA-C [facility] [phone]

## 2021-10-02 ENCOUNTER — Ambulatory Visit: Payer: Medicare HMO | Admitting: Dermatology

## 2021-10-02 ENCOUNTER — Other Ambulatory Visit: Payer: Self-pay

## 2021-10-02 DIAGNOSIS — L719 Rosacea, unspecified: Secondary | ICD-10-CM | POA: Diagnosis not present

## 2021-10-02 DIAGNOSIS — Z872 Personal history of diseases of the skin and subcutaneous tissue: Secondary | ICD-10-CM | POA: Diagnosis not present

## 2021-10-02 DIAGNOSIS — L578 Other skin changes due to chronic exposure to nonionizing radiation: Secondary | ICD-10-CM

## 2021-10-02 MED ORDER — METRONIDAZOLE 0.75 % EX GEL: CUTANEOUS | 2 refills | Status: DC

## 2021-10-02 NOTE — Patient Instructions (Signed)
If you have any questions or concerns for your doctor, please call our main line at [phone] and press option 4 to reach your doctor's medical assistant. If no one answers, please leave a voicemail as directed and we will return your call as soon as possible. Messages left after 4 pm will be answered the following business day.   You may also send [place] a message via [place]. We typically respond to MyChart messages within 1-2 business days.  For prescription refills, please ask your pharmacy to contact our office. Our fax number is [phone].  If you have an urgent issue when the clinic is closed that cannot wait until the next business day, you can page your doctor at the number below.    Please note that while we do our best to be available for urgent issues outside of office hours, we are not available 24/7.   If you have an urgent issue and are unable to reach [place], you may choose to seek medical care at your doctor's office, retail clinic, urgent care center, or emergency room.  If you have a medical emergency, please immediately call 911 or go to the emergency department.  Pager Numbers  - Dr. Nehemiah Massed: [phone]  - Dr. Laurence Ferrari: [phone]  - Dr. Nicole Kindred: [phone]  In the event of inclement weather, please call our main line at [phone] for an update on the status of any delays or closures.  Dermatology Medication Tips: Please keep the boxes that topical medications come in in order to help keep track of the instructions about where and how to use these. Pharmacies typically print the medication instructions only on the boxes and not directly on the medication tubes.   If your medication is too expensive, please contact our office at [phone] option 4 or send [place] a message through [place].   We are unable to tell what your co-pay for medications will be in advance as this is different depending on your insurance coverage. However, we may be able to find a substitute  medication at lower cost or fill out paperwork to get insurance to cover a needed medication.   If a prior authorization is required to get your medication covered by your insurance company, please allow [place] 1-2 business days to complete this process.  Drug prices often vary depending on where the prescription is filled and some pharmacies may offer cheaper prices.  The website www.goodrx.com contains coupons for medications through different pharmacies. The prices here do not account for what the cost may be with help from insurance (it may be cheaper with your insurance), but the website can give you the price if you did not use any insurance.  - You can print the associated coupon and take it with your prescription to the pharmacy.  - You may also stop by our office during regular business hours and pick up a GoodRx coupon card.  - If you need your prescription sent electronically to a different pharmacy, notify our office through [facility] or by phone at [phone] option 4. If you have any questions or concerns for your doctor, please call our main line at [phone] and press option 4 to reach your doctor's medical assistant. If no one answers, please leave a voicemail as directed and we will return your call as soon as possible. Messages left after 4 pm will be answered the following business day.   You may also send [place] a message via [place]. We typically respond to MyChart messages  within 1-2 business days.  For prescription refills, please ask your pharmacy to contact our office. Our fax number is [phone].  If you have an urgent issue when the clinic is closed that cannot wait until the next business day, you can page your doctor at the number below.    Please note that while we do our best to be available for urgent issues outside of office hours, we are not available 24/7.   If you have an urgent issue and are unable to reach [place], you may choose to seek medical care at  your doctor's office, retail clinic, urgent care center, or emergency room.  If you have a medical emergency, please immediately call 911 or go to the emergency department.  Pager Numbers  - Dr. Nehemiah Massed: [phone]  - Dr. Laurence Ferrari: [phone]  - Dr. Nicole Kindred: [phone]  In the event of inclement weather, please call our main line at [phone] for an update on the status of any delays or closures.  Dermatology Medication Tips: Please keep the boxes that topical medications come in in order to help keep track of the instructions about where and how to use these. Pharmacies typically print the medication instructions only on the boxes and not directly on the medication tubes.   If your medication is too expensive, please contact our office at [phone] option 4 or send [place] a message through [place].   We are unable to tell what your co-pay for medications will be in advance as this is different depending on your insurance coverage. However, we may be able to find a substitute medication at lower cost or fill out paperwork to get insurance to cover a needed medication.   If a prior authorization is required to get your medication covered by your insurance company, please allow [place] 1-2 business days to complete this process.  Drug prices often vary depending on where the prescription is filled and some pharmacies may offer cheaper prices.  The website www.goodrx.com contains coupons for medications through different pharmacies. The prices here do not account for what the cost may be with help from insurance (it may be cheaper with your insurance), but the website can give you the price if you did not use any insurance.  - You can print the associated coupon and take it with your prescription to the pharmacy.  - You may also stop by our office during regular business hours and pick up a GoodRx coupon card.  - If you need your prescription sent electronically to a different pharmacy,  notify our office through [facility] or by phone at [phone] option 4.

## 2021-10-02 NOTE — Progress Notes (Signed)
   Follow-Up Visit   Subjective  Troy Reynolds is a 70 y.o. male who presents for the following: Follow-up (8 months f/u on sun exposed skin ,hx of Aks ).  The following portions of the chart were reviewed this encounter and updated as appropriate:   Tobacco  Allergies  Meds  Problems  Med Hx  Surg Hx  Fam Hx     Review of Systems:  No other skin or systemic complaints except as noted in HPI or Assessment and Plan.  Objective  Well appearing patient in no apparent distress; mood and affect are within normal limits.  A focused examination was performed including face,scalp,arms. Relevant physical exam findings are noted in the Assessment and Plan.  face Clear skin   face Mid face erythema with telangiectasias +/- scattered inflammatory papules.    Assessment & Plan  History of actinic keratoses face  Actinic keratoses are precancerous spots that appear secondary to cumulative UV radiation exposure/sun exposure over time. They are chronic with expected duration over 1 year. A portion of actinic keratoses will progress to squamous cell carcinoma of the skin. It is not possible to reliably predict which spots will progress to skin cancer and so treatment is recommended to prevent development of skin cancer.  Recommend daily broad spectrum sunscreen SPF 30+ to sun-exposed areas, reapply every 2 hours as needed.  Recommend staying in the shade or wearing long sleeves, sun glasses (UVA+UVB protection) and wide brim hats (4-inch brim around the entire circumference of the hat). Call for new or changing lesions.   Clear today.  Observe   Rosacea face Rosacea is a chronic progressive skin condition usually affecting the face of adults, causing redness and/or acne bumps. It is treatable but not curable. It sometimes affects the eyes (ocular rosacea) as well. It may respond to topical and/or systemic medication and can flare with stress, sun exposure, alcohol, exercise and some foods.   Daily application of broad spectrum spf 30+ sunscreen to face is recommended to reduce flares.   Start Metrogel .75% Apply a thin film to face at bedtime  If expensive consider skin medicinals triple rosacea cream.  Related Medications metroNIDAZOLE (METROGEL) 0.75 % gel Apply a thin film to face at bedtime  Actinic Damage - chronic, secondary to cumulative UV radiation exposure/sun exposure over time - diffuse scaly erythematous macules with underlying dyspigmentation - Recommend daily broad spectrum sunscreen SPF 30+ to sun-exposed areas, reapply every 2 hours as needed.  - Recommend staying in the shade or wearing long sleeves, sun glasses (UVA+UVB protection) and wide brim hats (4-inch brim around the entire circumference of the hat). - Call for new or changing lesions.   Return in about 6 months (around 04/01/2022) for Aks, Rosacea .  Troy Reynolds, CMA, am acting as scribe for Sarina Ser, MD .  Documentation: I have reviewed the above documentation for accuracy and completeness, and I agree with the above.  Sarina Ser, MD

## 2021-10-03 ENCOUNTER — Telehealth: Payer: Self-pay

## 2021-10-03 NOTE — Telephone Encounter (Signed)
Pt requesting alternative to metronidazole gel. Pts copay is $100.

## 2021-10-07 NOTE — Telephone Encounter (Signed)
Tried to call pt. Vm not set up.

## 2021-10-08 ENCOUNTER — Other Ambulatory Visit: Payer: Self-pay

## 2021-10-08 DIAGNOSIS — L719 Rosacea, unspecified: Secondary | ICD-10-CM

## 2021-10-08 MED ORDER — METRONIDAZOLE 0.75 % EX GEL
CUTANEOUS | 2 refills | Status: DC
Start: 2021-10-08 — End: 2022-07-18

## 2021-10-08 NOTE — Progress Notes (Signed)
Patient would like to pick up Metronidazole from [facility] using a Good RX Coupon. RX sent in and patient advised this is running about $37 right now.

## 2021-10-12 ENCOUNTER — Encounter: Payer: Self-pay | Admitting: Dermatology

## 2022-01-16 ENCOUNTER — Encounter: Payer: Self-pay | Admitting: Internal Medicine

## 2022-01-16 ENCOUNTER — Inpatient Hospital Stay: Payer: Medicare HMO | Attending: Internal Medicine

## 2022-01-16 ENCOUNTER — Other Ambulatory Visit: Payer: Self-pay

## 2022-01-16 ENCOUNTER — Inpatient Hospital Stay: Payer: Medicare HMO | Admitting: Internal Medicine

## 2022-01-16 VITALS — BP 129/76 | HR 83 | Temp 97.9°F | Resp 18 | Wt 215.0 lb

## 2022-01-16 DIAGNOSIS — C8203 Follicular lymphoma grade I, intra-abdominal lymph nodes: Secondary | ICD-10-CM | POA: Diagnosis not present

## 2022-01-16 DIAGNOSIS — Z923 Personal history of irradiation: Secondary | ICD-10-CM | POA: Insufficient documentation

## 2022-01-16 DIAGNOSIS — Z9221 Personal history of antineoplastic chemotherapy: Secondary | ICD-10-CM | POA: Diagnosis not present

## 2022-01-16 DIAGNOSIS — Z8572 Personal history of non-Hodgkin lymphomas: Secondary | ICD-10-CM | POA: Diagnosis present

## 2022-01-16 DIAGNOSIS — Z79899 Other long term (current) drug therapy: Secondary | ICD-10-CM | POA: Insufficient documentation

## 2022-01-16 LAB — CBC WITH DIFFERENTIAL/PLATELET
Abs Immature Granulocytes: 0.03 10*3/uL (ref 0.00–0.07)
Basophils Absolute: 0.1 10*3/uL (ref 0.0–0.1)
Basophils Relative: 1 %
Eosinophils Absolute: 0.3 10*3/uL (ref 0.0–0.5)
Eosinophils Relative: 6 %
HCT: 46.8 % (ref 39.0–52.0)
Hemoglobin: 15.7 g/dL (ref 13.0–17.0)
Immature Granulocytes: 1 %
Lymphocytes Relative: 20 %
Lymphs Abs: 0.9 10*3/uL (ref 0.7–4.0)
MCH: 29.3 pg (ref 26.0–34.0)
MCHC: 33.5 g/dL (ref 30.0–36.0)
MCV: 87.5 fL (ref 80.0–100.0)
Monocytes Absolute: 0.5 10*3/uL (ref 0.1–1.0)
Monocytes Relative: 10 %
Neutro Abs: 2.9 10*3/uL (ref 1.7–7.7)
Neutrophils Relative %: 62 %
Platelets: 197 10*3/uL (ref 150–400)
RBC: 5.35 MIL/uL (ref 4.22–5.81)
RDW: 13.5 % (ref 11.5–15.5)
WBC: 4.6 10*3/uL (ref 4.0–10.5)
nRBC: 0 % (ref 0.0–0.2)

## 2022-01-16 LAB — COMPREHENSIVE METABOLIC PANEL
ALT: 30 U/L (ref 0–44)
AST: 27 U/L (ref 15–41)
Albumin: 3.8 g/dL (ref 3.5–5.0)
Alkaline Phosphatase: 68 U/L (ref 38–126)
Anion gap: 5 (ref 5–15)
BUN: 15 mg/dL (ref 8–23)
CO2: 27 mmol/L (ref 22–32)
Calcium: 8.5 mg/dL — ABNORMAL LOW (ref 8.9–10.3)
Chloride: 106 mmol/L (ref 98–111)
Creatinine, Ser: 0.98 mg/dL (ref 0.61–1.24)
GFR, Estimated: >60 mL/min (ref >60)
Glucose, Bld: 153 mg/dL — ABNORMAL HIGH (ref 70–99)
Potassium: 3.6 mmol/L (ref 3.5–5.1)
Sodium: 138 mmol/L (ref 135–145)
Total Bilirubin: 0.4 mg/dL (ref 0.3–1.2)
Total Protein: 6.6 g/dL (ref 6.5–8.1)

## 2022-01-16 LAB — LACTATE DEHYDROGENASE: LDH: 136 U/L (ref 98–192)

## 2022-01-16 NOTE — Progress Notes (Signed)
Troy Reynolds OFFICE PROGRESS NOTE  Patient Care Team: Rusty Aus, MD as PCP - General (Internal Medicine) Hubbard Robinson, MD as Consulting Physician (Surgery) Efrain Sella, MD as Consulting Physician (Gastroenterology) Cammie Sickle, MD as Consulting Physician (Internal Medicine)   Cancer Staging  No matching staging information was found for the patient.   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 Recm surviellance; Dr.Crystal.  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  In the interim underwent colonoscopy/upper endoscopy.  Continues to have intermittent dysphagia.  Not any worse.  No weight loss.  Patient denies new problems/concerns today.  No abdominal pain or nausea vomiting.  No fever chills.  No lumps  or bumps.   Review of Systems  Constitutional:  Negative for chills, diaphoresis, fever and weight loss.  HENT:  Negative for nosebleeds and sore throat.   Eyes:  Negative for double vision.  Respiratory:  Negative for cough, hemoptysis, sputum production, shortness of breath and wheezing.   Cardiovascular:  Negative for chest pain, palpitations, orthopnea and leg swelling.  Gastrointestinal:  Positive for constipation. Negative for abdominal pain, blood in stool, diarrhea, heartburn, melena and vomiting.       Abdominal discomfort/bloating.  Genitourinary:  Negative for dysuria, frequency and urgency.  Musculoskeletal:  Negative for back pain and joint pain.  Skin: Negative.  Negative for itching and rash.  Neurological:  Negative for dizziness, tingling, focal weakness, weakness and headaches.  Endo/Heme/Allergies:  Does not bruise/bleed easily.  Psychiatric/Behavioral:  Negative for depression. The patient is not nervous/anxious and does not have insomnia.      PAST MEDICAL HISTORY :  Past Medical History:  Diagnosis Date   Colon polyp, hyperplastic    Crohn's disease ([place])    Follicular lymphoma ([place]) 2014   Chemo tx's.   GERD (gastroesophageal reflux disease)    Hypertension    Hypothyroid    Kidney stones    Primary Parkinson's disease ([place])    Skin cancer    R jaw - treated by Dr. Koleen Nimrod many years ago - unsure which type   Sleep apnea     PAST SURGICAL HISTORY :   Past Surgical History:  Procedure Laterality Date   BOWEL RESECTION N/A 06/19/2016   Procedure: SMALL BOWEL RESECTION;  Surgeon: Hubbard Robinson, MD;  Location: [facility] ORS;  Service: General;  Laterality: N/A;   CHOLECYSTECTOMY     COLONOSCOPY     COLONOSCOPY WITH  PROPOFOL N/A 07/19/2021   Procedure: COLONOSCOPY WITH PROPOFOL;  Surgeon: Lesly Rubenstein, MD;  Location: [facility];  Service: Endoscopy;  Laterality: N/A;   ESOPHAGOGASTRODUODENOSCOPY     ESOPHAGOGASTRODUODENOSCOPY (EGD) WITH  PROPOFOL N/A 07/19/2021   Procedure: ESOPHAGOGASTRODUODENOSCOPY (EGD) WITH PROPOFOL;  Surgeon: Lesly Rubenstein, MD;  Location: [facility] ENDOSCOPY;  Service: Endoscopy;  Laterality: N/A;   LAPAROSCOPIC REMOVAL OF MESENTERIC MASS N/A 06/19/2016   Procedure: LAPAROSCOPIC REMOVAL OF MESENTERIC MASS;  Surgeon: Hubbard Robinson, MD;  Location: [facility] ORS;  Service: General;  Laterality: N/A;   LYMPH NODE DISSECTION     PORT-A-CATH REMOVAL     PORTACATH PLACEMENT      FAMILY HISTORY :   Family History  Problem Relation Age of Onset   Clotting disorder Mother    Pulmonary embolism Mother    Lymphoma Father    Hiatal hernia Father    Heart disease Father        stent placed   Hypertension Father    Ulcers Father    Brain cancer Maternal Aunt    Cancer Paternal Uncle        esophagus    SOCIAL HISTORY:   Social History   Tobacco Use   Smoking status: Never   Smokeless tobacco: Never  Vaping Use   Vaping Use: Never used  Substance Use Topics   Alcohol use: No   Drug use: No    ALLERGIES:  has No Known Allergies.  MEDICATIONS:  Current Outpatient Medications  Medication Sig Dispense Refill   cetirizine (ZYRTEC) 10 MG tablet Take 1 tablet by mouth daily.     fluticasone (FLONASE) 50 MCG/ACT nasal spray Place 1 spray into both nostrils daily as needed for allergies or rhinitis.     levothyroxine (SYNTHROID) 75 MCG tablet PLEASE SEE ATTACHED FOR DETAILED DIRECTIONS     losartan-hydrochlorothiazide (HYZAAR) 50-12.5 MG per tablet TAKE 1 TABLET DAILY.     metroNIDAZOLE (METROGEL) 0.75 % gel Apply a thin film to face at bedtime 45 g 2   Multiple Vitamin (MULTIVITAMIN WITH MINERALS) TABS tablet Take 1 tablet by mouth daily.     pantoprazole (PROTONIX) 40 MG tablet Take 40 mg by mouth daily.      Probiotic Product ([place]) Take by mouth daily.     simvastatin (ZOCOR) 20 MG tablet Take 1 tablet by mouth daily.     tamsulosin (FLOMAX) 0.4 MG CAPS capsule Take 0.4 mg by  mouth daily.     Magnesium 250 MG TABS Take by mouth.     No current facility-administered medications for this visit.    PHYSICAL EXAMINATION: ECOG PERFORMANCE STATUS: 1 - Symptomatic but completely ambulatory  BP 129/76    Pulse 83    Temp 97.9 F (36.6 C)    Resp 18    Wt 215 lb (97.5 kg)    BMI 28.37 kg/m   Filed Weights   01/16/22 1000  Weight: 215 lb (97.5 kg)   Physical Exam HENT:     Head: Normocephalic and atraumatic.     Mouth/Throat:     Pharynx: No oropharyngeal exudate.  Eyes:     Pupils: Pupils are equal, round, and reactive to light.  Cardiovascular:     Rate and Rhythm: Normal rate and regular rhythm.  Pulmonary:     Effort: No respiratory distress.     Breath sounds: No wheezing.  Abdominal:     General: Bowel sounds are normal. There is no distension.  Palpations: Abdomen is soft. There is no mass.     Tenderness: There is no abdominal tenderness. There is no guarding or rebound.     Comments: Midabdominal incision noted.  Well-healed.  Musculoskeletal:        General: No tenderness. Normal range of motion.     Cervical back: Normal range of motion and neck supple.  Skin:    General: Skin is warm.  Neurological:     Mental Status: He is alert and oriented to person, place, and time.  Psychiatric:        Mood and Affect: Affect normal.      LABORATORY DATA:  I have reviewed the data as listed    Component Value Date/Time   NA 138 07/18/2021 0950   NA 140 03/12/2015 1007   K 3.5 07/18/2021 0950   K 3.7 03/12/2015 1007   CL 103 07/18/2021 0950   CL 105 03/12/2015 1007   CO2 26 07/18/2021 0950   CO2 29 03/12/2015 1007   GLUCOSE 101 (H) 07/18/2021 0950   GLUCOSE 143 (H) 03/12/2015 1007   BUN 15 07/18/2021 0950   BUN 15 03/12/2015 1007   CREATININE 0.89 07/18/2021 0950   CREATININE 0.92 03/12/2015 1007   CALCIUM 8.5 (L) 07/18/2021 0950   CALCIUM 8.6 (L) 03/12/2015 1007   PROT 6.8 07/18/2021 0950   PROT 6.6 03/12/2015 1007   ALBUMIN  4.2 07/18/2021 0950   ALBUMIN 4.0 03/12/2015 1007   AST 21 07/18/2021 0950   AST 26 03/12/2015 1007   ALT 27 07/18/2021 0950   ALT 37 03/12/2015 1007   ALKPHOS 62 07/18/2021 0950   ALKPHOS 79 03/12/2015 1007   BILITOT 0.8 07/18/2021 0950   BILITOT 0.8 03/12/2015 1007   GFRNONAA >60 07/18/2021 0950   GFRNONAA >60 03/12/2015 1007   GFRAA >60 07/19/2020 1011   GFRAA >60 03/12/2015 1007    No results found for: SPEP, UPEP  Lab Results  Component Value Date   WBC 4.6 01/16/2022   NEUTROABS 2.9 01/16/2022   HGB 15.7 01/16/2022   HCT 46.8 01/16/2022   MCV 87.5 01/16/2022   PLT 197 01/16/2022      Chemistry      Component Value Date/Time   NA 138 07/18/2021 0950   NA 140 03/12/2015 1007   K 3.5 07/18/2021 0950   K 3.7 03/12/2015 1007   CL 103 07/18/2021 0950   CL 105 03/12/2015 1007   CO2 26 07/18/2021 0950   CO2 29 03/12/2015 1007   BUN 15 07/18/2021 0950   BUN 15 03/12/2015 1007   CREATININE 0.89 07/18/2021 0950   CREATININE 0.92 03/12/2015 1007      Component Value Date/Time   CALCIUM 8.5 (L) 07/18/2021 0950   CALCIUM 8.6 (L) 03/12/2015 1007   ALKPHOS 62 07/18/2021 0950   ALKPHOS 79 03/12/2015 1007   AST 21 07/18/2021 0950   AST 26 03/12/2015 1007   ALT 27 07/18/2021 0950   ALT 37 03/12/2015 1007   BILITOT 0.8 07/18/2021 0950   BILITOT 0.8 03/12/2015 1007       RADIOGRAPHIC STUDIES: I have personally reviewed the radiological images as listed and agreed with the findings in the report. No results found.   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  Currently on surveillance;  CT AUG 2022- Slight enlargement of mesenteric adenopathy [by 4-5 mm].  Patient continues to be symptomatic/continue surveillance.  Repeat imaging in 6 months.  Again reviewed the potential signs and symptoms of symptomatic lymphoma.  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   # DISPOSITION:my chart appt # follow up in 6 months; MD- labs- CBC/MP/LDH;prior- CT CAP-Dr.B        Orders Placed This Encounter  Procedures   CT CHEST ABDOMEN PELVIS W CONTRAST    Standing Status:   Future    Standing Expiration Date:   01/16/2023    Order Specific Question:   Preferred imaging location?    Answer:   [facility]    Order Specific Question:   Radiology Contrast Protocol - do NOT remove file path    Answer:   [email or web address]   CBC with Differential/Platelet    Standing Status:   Future    Standing Expiration Date:   01/16/2023   Comprehensive metabolic panel    Standing Status:   Future    Standing Expiration Date:   01/16/2023   Lactate dehydrogenase    Standing Status:   Future    Standing Expiration Date:   01/16/2023   All questions were answered. The patient knows to call the clinic with any problems, questions or concerns.      Cammie Sickle, MD 01/16/2022 11:01 AM

## 2022-01-16 NOTE — Progress Notes (Signed)
Patient denies new problems/concerns today.   °

## 2022-01-16 NOTE — Assessment & Plan Note (Addendum)
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  Currently on surveillance;  CT AUG 2022- Slight enlargement of mesenteric adenopathy [by 4-5 mm].  Patient continues to be symptomatic/continue surveillance.  Repeat imaging in 6 months.  Again reviewed the potential signs and symptoms of symptomatic lymphoma.  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   # DISPOSITION:my chart appt # follow up in 6 months; MD- labs- CBC/MP/LDH;prior- CT CAP-Dr.B

## 2022-04-16 ENCOUNTER — Encounter: Payer: Self-pay | Admitting: Dermatology

## 2022-04-16 ENCOUNTER — Ambulatory Visit: Payer: Medicare HMO | Admitting: Dermatology

## 2022-04-16 DIAGNOSIS — L821 Other seborrheic keratosis: Secondary | ICD-10-CM

## 2022-04-16 DIAGNOSIS — Z872 Personal history of diseases of the skin and subcutaneous tissue: Secondary | ICD-10-CM

## 2022-04-16 DIAGNOSIS — D692 Other nonthrombocytopenic purpura: Secondary | ICD-10-CM

## 2022-04-16 DIAGNOSIS — L814 Other melanin hyperpigmentation: Secondary | ICD-10-CM

## 2022-04-16 DIAGNOSIS — L719 Rosacea, unspecified: Secondary | ICD-10-CM | POA: Diagnosis not present

## 2022-04-16 DIAGNOSIS — L700 Acne vulgaris: Secondary | ICD-10-CM | POA: Diagnosis not present

## 2022-04-16 DIAGNOSIS — D229 Melanocytic nevi, unspecified: Secondary | ICD-10-CM

## 2022-04-16 DIAGNOSIS — L578 Other skin changes due to chronic exposure to nonionizing radiation: Secondary | ICD-10-CM

## 2022-04-16 NOTE — Patient Instructions (Signed)

## 2022-04-16 NOTE — Progress Notes (Signed)
Follow-Up Visit   Subjective  Troy Reynolds is a 71 y.o. male who presents for the following: Actinic Keratosis (Face, 13mf/u), Rosacea (Face, 666m/u, Metrogel 0.75% ~qhs), and check spot (Back, itchy). The patient has spots, moles and lesions to be evaluated, some may be new or changing and the patient has concerns that these could be cancer.  The following portions of the chart were reviewed this encounter and updated as appropriate:   Tobacco  Allergies  Meds  Problems  Med Hx  Surg Hx  Fam Hx     Review of Systems:  No other skin or systemic complaints except as noted in HPI or Assessment and Plan.  Objective  Well appearing patient in no apparent distress; mood and affect are within normal limits.  A focused examination was performed including face, scalp, arms. Relevant physical exam findings are noted in the Assessment and Plan.  face Erythema and telangiectasias cheeks, nose  Mid Back Open comedone back   Assessment & Plan   Actinic Damage - chronic, secondary to cumulative UV radiation exposure/sun exposure over time - diffuse scaly erythematous macules with underlying dyspigmentation - Recommend daily broad spectrum sunscreen SPF 30+ to sun-exposed areas, reapply every 2 hours as needed.  - Recommend staying in the shade or wearing long sleeves, sun glasses (UVA+UVB protection) and wide brim hats (4-inch brim around the entire circumference of the hat). - Call for new or changing lesions.   Seborrheic Keratoses - Stuck-on, waxy, tan-brown papules and/or plaques  - Benign-appearing - Discussed benign etiology and prognosis. - Observe - Call for any changes  Lentigines - Scattered tan macules - Due to sun exposure - Benign-appering, observe - Recommend daily broad spectrum sunscreen SPF 30+ to sun-exposed areas, reapply every 2 hours as needed. - Call for any changes  Purpura - Chronic; persistent and recurrent.  Treatable, but not curable. -  Violaceous macules and patches - Benign - Related to trauma, age, sun damage and/or use of blood thinners, chronic use of topical and/or oral steroids - Observe - Can use OTC arnica containing moisturizer such as Dermend Bruise Formula if desired - Call for worsening or other concerns   Rosacea face Rosacea is a chronic progressive skin condition usually affecting the face of adults, causing redness and/or acne bumps. It is treatable but not curable. It sometimes affects the eyes (ocular rosacea) as well. It may respond to topical and/or systemic medication and can flare with stress, sun exposure, alcohol, exercise and some foods.  Daily application of broad spectrum spf 30+ sunscreen to face is recommended to reduce flares.  Discussed the treatment option of BBL/laser.  Typically we recommend 1-3 treatment sessions about 5-8 weeks apart for best results.  The patient's condition may require "maintenance treatments" in the future.  The fee for BBL / laser treatments is $350 per treatment session for the whole face.  A fee can be quoted for other parts of the body. Insurance typically does not pay for BBL/laser treatments and therefore the fee is an out-of-pocket cost.  May cont Metrogel 0.75% qhs prn flares  Related Medications metroNIDAZOLE (METROGEL) 0.75 % gel Apply a thin film to face at bedtime  Acne vulgaris Mid Back Patient declines treatment Benign, observe.    History of PreCancerous Actinic Keratosis  - site(s) of PreCancerous Actinic Keratosis clear today. - these may recur and new lesions may form requiring treatment to prevent transformation into skin cancer - observe for new or changing spots  and contact [place] for appointment if occur - photoprotection with sun protective clothing; sunglasses and broad spectrum sunscreen with SPF of at least 30 + and frequent self skin exams recommended - yearly exams by a dermatologist recommended for persons with history  of PreCancerous Actinic Keratoses   Melanocytic Nevi - Tan-brown and/or pink-flesh-colored symmetric macules and papules - Benign appearing on exam today - Observation - Call clinic for new or changing moles - Recommend daily use of broad spectrum spf 30+ sunscreen to sun-exposed areas.    Return in about 1 year (around 04/17/2023) for TBSE, Hx of AKs.  I, Othelia Pulling, RMA, am acting as scribe for Sarina Ser, MD . Documentation: I have reviewed the above documentation for accuracy and completeness, and I agree with the above.  Sarina Ser, MD

## 2022-04-21 ENCOUNTER — Encounter: Payer: Self-pay | Admitting: Dermatology

## 2022-06-27 ENCOUNTER — Other Ambulatory Visit: Payer: Self-pay | Admitting: Thoracic Surgery (Cardiothoracic Vascular Surgery)

## 2022-06-27 DIAGNOSIS — I7121 Aneurysm of the ascending aorta, without rupture: Secondary | ICD-10-CM

## 2022-07-02 ENCOUNTER — Other Ambulatory Visit: Payer: Self-pay | Admitting: Physician Assistant

## 2022-07-02 ENCOUNTER — Ambulatory Visit
Admission: RE | Admit: 2022-07-02 | Discharge: 2022-07-02 | Disposition: A | Discharge status: Home / Self Care | Payer: Medicare HMO | Source: Ambulatory Visit | Attending: Physician Assistant | Admitting: Physician Assistant

## 2022-07-02 DIAGNOSIS — R1084 Generalized abdominal pain: Secondary | ICD-10-CM | POA: Diagnosis not present

## 2022-07-02 MED ORDER — IOHEXOL 300 MG/ML  SOLN
100.0000 mL | Freq: Once | INTRAMUSCULAR | Status: AC | PRN
  Administered 2022-07-02: 100 mL via INTRAVENOUS

## 2022-07-16 ENCOUNTER — Ambulatory Visit: Payer: Medicare HMO

## 2022-07-18 ENCOUNTER — Inpatient Hospital Stay: Payer: Medicare HMO | Attending: Internal Medicine

## 2022-07-18 ENCOUNTER — Inpatient Hospital Stay (HOSPITAL_BASED_OUTPATIENT_CLINIC_OR_DEPARTMENT_OTHER): Payer: Medicare HMO | Admitting: Internal Medicine

## 2022-07-18 ENCOUNTER — Encounter: Payer: Self-pay | Admitting: Internal Medicine

## 2022-07-18 DIAGNOSIS — Z8572 Personal history of non-Hodgkin lymphomas: Secondary | ICD-10-CM | POA: Insufficient documentation

## 2022-07-18 DIAGNOSIS — Z9221 Personal history of antineoplastic chemotherapy: Secondary | ICD-10-CM | POA: Insufficient documentation

## 2022-07-18 DIAGNOSIS — Z79899 Other long term (current) drug therapy: Secondary | ICD-10-CM | POA: Insufficient documentation

## 2022-07-18 DIAGNOSIS — C8203 Follicular lymphoma grade I, intra-abdominal lymph nodes: Secondary | ICD-10-CM

## 2022-07-18 DIAGNOSIS — Z923 Personal history of irradiation: Secondary | ICD-10-CM | POA: Insufficient documentation

## 2022-07-18 DIAGNOSIS — N401 Enlarged prostate with lower urinary tract symptoms: Secondary | ICD-10-CM | POA: Insufficient documentation

## 2022-07-18 LAB — CBC WITH DIFFERENTIAL/PLATELET
Abs Immature Granulocytes: 0.05 10*3/uL (ref 0.00–0.07)
Basophils Absolute: 0.1 10*3/uL (ref 0.0–0.1)
Basophils Relative: 2 %
Eosinophils Absolute: 0.2 10*3/uL (ref 0.0–0.5)
Eosinophils Relative: 4 %
HCT: 48.9 % (ref 39.0–52.0)
Hemoglobin: 16.5 g/dL (ref 13.0–17.0)
Immature Granulocytes: 1 %
Lymphocytes Relative: 17 %
Lymphs Abs: 0.9 10*3/uL (ref 0.7–4.0)
MCH: 29.1 pg (ref 26.0–34.0)
MCHC: 33.7 g/dL (ref 30.0–36.0)
MCV: 86.2 fL (ref 80.0–100.0)
Monocytes Absolute: 0.4 10*3/uL (ref 0.1–1.0)
Monocytes Relative: 8 %
Neutro Abs: 3.8 10*3/uL (ref 1.7–7.7)
Neutrophils Relative %: 68 %
Platelets: 244 10*3/uL (ref 150–400)
RBC: 5.67 MIL/uL (ref 4.22–5.81)
RDW: 13.5 % (ref 11.5–15.5)
WBC: 5.5 10*3/uL (ref 4.0–10.5)
nRBC: 0 % (ref 0.0–0.2)

## 2022-07-18 LAB — COMPREHENSIVE METABOLIC PANEL
ALT: 27 U/L (ref 0–44)
AST: 25 U/L (ref 15–41)
Albumin: 4 g/dL (ref 3.5–5.0)
Alkaline Phosphatase: 64 U/L (ref 38–126)
Anion gap: 7 (ref 5–15)
BUN: 15 mg/dL (ref 8–23)
CO2: 25 mmol/L (ref 22–32)
Calcium: 8.5 mg/dL — ABNORMAL LOW (ref 8.9–10.3)
Chloride: 105 mmol/L (ref 98–111)
Creatinine, Ser: 0.86 mg/dL (ref 0.61–1.24)
GFR, Estimated: >60 mL/min (ref >60)
Glucose, Bld: 155 mg/dL — ABNORMAL HIGH (ref 70–99)
Potassium: 3.5 mmol/L (ref 3.5–5.1)
Sodium: 137 mmol/L (ref 135–145)
Total Bilirubin: 0.7 mg/dL (ref 0.3–1.2)
Total Protein: 6.8 g/dL (ref 6.5–8.1)

## 2022-07-18 LAB — LACTATE DEHYDROGENASE: LDH: 132 U/L (ref 98–192)

## 2022-07-18 NOTE — Progress Notes (Signed)
No concerns. 

## 2022-07-18 NOTE — Assessment & Plan Note (Addendum)
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  Currently on surveillance; AUG 2023-enlarged but stable eccentric 3.3 x 2 cm cm in right lower quadrant.  No notes of any progression.  Recommend imaging on a yearly basis.  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   # Prostate enlargement- [on Flomax]- recommend evaluation with urology. Defer to PCP.   my chart appt # DISPOSITION: # follow up in 6 months; MD- labs- CBC/MP/LDH;prior--Dr.B  # I reviewed the blood work- with the patient in detail; also reviewed the imaging independently [as summarized above]; and with the patient in detail.

## 2022-07-18 NOTE — Progress Notes (Signed)
[place] OFFICE PROGRESS NOTE  Patient Care Team: Rusty Aus, MD as PCP - General (Internal Medicine) Hubbard Robinson, MD as Consulting Physician (Surgery) Efrain Sella, MD as Consulting Physician (Gastroenterology) Cammie Sickle, MD as Consulting Physician (Internal Medicine)   Cancer Staging  No matching staging information was found for the patient.   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 Recm surviellance; Dr.Crystal.  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  Patient noted to have abdominal discomfort for which she had a CT scan ordered by his PCP.  Currently abdominal pain resolved.  Patient denies new problems/concerns today.  No abdominal pain or nausea vomiting.  No  fever chills.  No lumps or bumps.   He does admit to increased frequency of urination at nighttime.  No dysuria.  Review of Systems  Constitutional:  Negative for chills, diaphoresis, fever and weight loss.  HENT:  Negative for nosebleeds and sore throat.   Eyes:  Negative for double vision.  Respiratory:  Negative for cough, hemoptysis, sputum production, shortness of breath and wheezing.   Cardiovascular:  Negative for chest pain, palpitations, orthopnea and leg swelling.  Gastrointestinal:  Positive for constipation. Negative for abdominal pain, blood in stool, diarrhea, heartburn, melena and vomiting.       Abdominal discomfort/bloating.  Genitourinary:  Negative for dysuria, frequency and urgency.  Musculoskeletal:  Negative for back pain and joint pain.  Skin: Negative.  Negative for itching and rash.  Neurological:  Negative for dizziness, tingling, focal weakness, weakness and headaches.  Endo/Heme/Allergies:  Does not bruise/bleed easily.  Psychiatric/Behavioral:  Negative for depression. The patient is not nervous/anxious and does not have insomnia.       PAST MEDICAL HISTORY :  Past Medical History:  Diagnosis Date   Actinic keratosis    Colon polyp, hyperplastic    Crohn's disease ([place])    Follicular lymphoma ([place]) 2014   Chemo tx's.   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 Koleen Nimrod many years ago - unsure which type   Sleep apnea     PAST SURGICAL HISTORY :   Past Surgical History:  Procedure Laterality Date   BOWEL RESECTION N/A 06/19/2016   Procedure: SMALL BOWEL RESECTION;  Surgeon: Barnetta Chapel  Starla Link, MD;  Location: [facility] ORS;  Service: General;  Laterality: N/A;   CHOLECYSTECTOMY     COLONOSCOPY     COLONOSCOPY WITH PROPOFOL N/A 07/19/2021   Procedure: COLONOSCOPY WITH PROPOFOL;  Surgeon: Lesly Rubenstein, MD;  Location: [facility]  ENDOSCOPY;  Service: Endoscopy;  Laterality: N/A;   ESOPHAGOGASTRODUODENOSCOPY     ESOPHAGOGASTRODUODENOSCOPY (EGD) WITH PROPOFOL N/A 07/19/2021   Procedure: ESOPHAGOGASTRODUODENOSCOPY (EGD) WITH PROPOFOL;  Surgeon: Lesly Rubenstein, MD;  Location: [facility] ENDOSCOPY;  Service: Endoscopy;  Laterality: N/A;   LAPAROSCOPIC REMOVAL OF MESENTERIC MASS N/A 06/19/2016   Procedure: LAPAROSCOPIC REMOVAL OF MESENTERIC MASS;  Surgeon: Hubbard Robinson, MD;  Location: [facility] ORS;  Service: General;  Laterality: N/A;   LYMPH NODE DISSECTION     PORT-A-CATH REMOVAL     PORTACATH PLACEMENT      FAMILY HISTORY :   Family History  Problem Relation Age of Onset   Clotting disorder Mother    Pulmonary embolism Mother    Lymphoma Father    Hiatal hernia Father    Heart disease Father        stent placed   Hypertension Father    Ulcers Father    Brain cancer Maternal Aunt    Cancer Paternal Uncle        esophagus    SOCIAL HISTORY:   Social History   Tobacco Use   Smoking status: Never   Smokeless tobacco: Never  Vaping Use   Vaping Use: Never used  Substance Use Topics   Alcohol use: No   Drug use: No    ALLERGIES:  has No Known Allergies.  MEDICATIONS:  Current Outpatient Medications  Medication Sig Dispense Refill   cetirizine (ZYRTEC) 10 MG tablet Take 1 tablet by mouth daily.     fluticasone (FLONASE) 50 MCG/ACT nasal spray Place 1 spray into both nostrils daily as needed for allergies or rhinitis.     levothyroxine (SYNTHROID) 75 MCG tablet PLEASE SEE ATTACHED FOR DETAILED DIRECTIONS     losartan-hydrochlorothiazide (HYZAAR) 50-12.5 MG per tablet TAKE 1 TABLET DAILY.     pantoprazole (PROTONIX) 40 MG tablet Take 40 mg by mouth daily.      simvastatin (ZOCOR) 20 MG tablet Take 1 tablet by mouth daily.     tamsulosin (FLOMAX) 0.4 MG CAPS capsule Take 0.4 mg by mouth daily.     No current facility-administered medications for this visit.    PHYSICAL EXAMINATION: ECOG PERFORMANCE  STATUS: 1 - Symptomatic but completely ambulatory  BP 129/87 (BP Location: Right Arm, Patient Position: Sitting, Cuff Size: Normal)   Pulse 73   Temp (!) 96.9 F (36.1 C) (Tympanic)   Ht '6\' 1"'$  (1.854 m)   Wt 206 lb (93.4 kg)   SpO2 99%   BMI 27.18 kg/m   Filed Weights   07/18/22 1031  Weight: 206 lb (93.4 kg)   Physical Exam HENT:     Head: Normocephalic and atraumatic.     Mouth/Throat:     Pharynx: No oropharyngeal exudate.  Eyes:     Pupils: Pupils are equal, round, and reactive to light.  Cardiovascular:     Rate and Rhythm: Normal rate and regular rhythm.  Pulmonary:     Effort: No respiratory distress.     Breath sounds: No wheezing.  Abdominal:     General: Bowel sounds are normal. There is no distension.     Palpations: Abdomen is soft. There is no mass.     Tenderness: There is no abdominal  tenderness. There is no guarding or rebound.     Comments: Midabdominal incision noted.  Well-healed.  Musculoskeletal:        General: No tenderness. Normal range of motion.     Cervical back: Normal range of motion and neck supple.  Skin:    General: Skin is warm.  Neurological:     Mental Status: He is alert and oriented to person, place, and time.  Psychiatric:        Mood and Affect: Affect normal.       LABORATORY DATA:  I have reviewed the data as listed    Component Value Date/Time   NA 137 07/18/2022 0946   NA 140 03/12/2015 1007   K 3.5 07/18/2022 0946   K 3.7 03/12/2015 1007   CL 105 07/18/2022 0946   CL 105 03/12/2015 1007   CO2 25 07/18/2022 0946   CO2 29 03/12/2015 1007   GLUCOSE 155 (H) 07/18/2022 0946   GLUCOSE 143 (H) 03/12/2015 1007   BUN 15 07/18/2022 0946   BUN 15 03/12/2015 1007   CREATININE 0.86 07/18/2022 0946   CREATININE 0.92 03/12/2015 1007   CALCIUM 8.5 (L) 07/18/2022 0946   CALCIUM 8.6 (L) 03/12/2015 1007   PROT 6.8 07/18/2022 0946   PROT 6.6 03/12/2015 1007   ALBUMIN 4.0 07/18/2022 0946   ALBUMIN 4.0 03/12/2015 1007   AST  25 07/18/2022 0946   AST 26 03/12/2015 1007   ALT 27 07/18/2022 0946   ALT 37 03/12/2015 1007   ALKPHOS 64 07/18/2022 0946   ALKPHOS 79 03/12/2015 1007   BILITOT 0.7 07/18/2022 0946   BILITOT 0.8 03/12/2015 1007   GFRNONAA >60 07/18/2022 0946   GFRNONAA >60 03/12/2015 1007   GFRAA >60 07/19/2020 1011   GFRAA >60 03/12/2015 1007    No results found for: "SPEP", "UPEP"  Lab Results  Component Value Date   WBC 5.5 07/18/2022   NEUTROABS 3.8 07/18/2022   HGB 16.5 07/18/2022   HCT 48.9 07/18/2022   MCV 86.2 07/18/2022   PLT 244 07/18/2022      Chemistry      Component Value Date/Time   NA 137 07/18/2022 0946   NA 140 03/12/2015 1007   K 3.5 07/18/2022 0946   K 3.7 03/12/2015 1007   CL 105 07/18/2022 0946   CL 105 03/12/2015 1007   CO2 25 07/18/2022 0946   CO2 29 03/12/2015 1007   BUN 15 07/18/2022 0946   BUN 15 03/12/2015 1007   CREATININE 0.86 07/18/2022 0946   CREATININE 0.92 03/12/2015 1007      Component Value Date/Time   CALCIUM 8.5 (L) 07/18/2022 0946   CALCIUM 8.6 (L) 03/12/2015 1007   ALKPHOS 64 07/18/2022 0946   ALKPHOS 79 03/12/2015 1007   AST 25 07/18/2022 0946   AST 26 03/12/2015 1007   ALT 27 07/18/2022 0946   ALT 37 03/12/2015 1007   BILITOT 0.7 07/18/2022 0946   BILITOT 0.8 03/12/2015 1007       RADIOGRAPHIC STUDIES: I have personally reviewed the radiological images as listed and agreed with the findings in the report. No results found.   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  Currently on surveillance; AUG 2023-enlarged but stable eccentric 3.3 x 2 cm cm in right lower quadrant.  No notes of any progression.   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  # Thoracic AA-4.2 cm-  STABLE [await CT as above]- F/U with Dr.Callwood.   # Prostate enlargement- [on Flomax]- recommend evaluation with urology. Defer to PCP.   my chart appt # DISPOSITION: # follow up in 6 months; MD- labs- CBC/MP/LDH;prior--Dr.B  # I reviewed the blood work- with the patient in detail; also reviewed the imaging independently [as summarized above]; and with the patient in detail.          Orders Placed This Encounter  Procedures   CBC with Differential/Platelet    Standing Status:   Future    Standing Expiration Date:   07/19/2023   Comprehensive metabolic panel    Standing Status:   Future    Standing Expiration Date:   07/19/2023   Lactate dehydrogenase    Standing Status:   Future    Standing Expiration Date:   07/19/2023   All questions were answered. The patient knows to call the clinic with any problems, questions or concerns.      Cammie Sickle, MD 07/18/2022 2:14 PM

## 2022-08-11 ENCOUNTER — Ambulatory Visit: Payer: Medicare HMO

## 2022-08-11 ENCOUNTER — Other Ambulatory Visit: Payer: Medicare HMO

## 2022-10-01 ENCOUNTER — Other Ambulatory Visit: Payer: Self-pay | Admitting: Student

## 2022-10-01 DIAGNOSIS — G44221 Chronic tension-type headache, intractable: Secondary | ICD-10-CM

## 2023-01-08 ENCOUNTER — Ambulatory Visit
Admission: RE | Admit: 2023-01-08 | Discharge: 2023-01-08 | Disposition: A | Discharge status: Home / Self Care | Payer: Medicare HMO | Source: Ambulatory Visit | Attending: Student | Admitting: Student

## 2023-01-08 DIAGNOSIS — G44221 Chronic tension-type headache, intractable: Secondary | ICD-10-CM

## 2023-01-08 MED ORDER — GADOPICLENOL 0.5 MMOL/ML IV SOLN
10.0000 mL | Freq: Once | INTRAVENOUS | Status: AC | PRN
  Administered 2023-01-08: 10 mL via INTRAVENOUS

## 2023-01-14 ENCOUNTER — Inpatient Hospital Stay: Payer: Medicare HMO | Attending: Internal Medicine

## 2023-01-14 DIAGNOSIS — Z8572 Personal history of non-Hodgkin lymphomas: Secondary | ICD-10-CM | POA: Diagnosis not present

## 2023-01-14 DIAGNOSIS — Z9221 Personal history of antineoplastic chemotherapy: Secondary | ICD-10-CM | POA: Diagnosis not present

## 2023-01-14 DIAGNOSIS — C8203 Follicular lymphoma grade I, intra-abdominal lymph nodes: Secondary | ICD-10-CM

## 2023-01-14 DIAGNOSIS — Z923 Personal history of irradiation: Secondary | ICD-10-CM | POA: Insufficient documentation

## 2023-01-14 LAB — COMPREHENSIVE METABOLIC PANEL
ALT: 25 U/L (ref 0–44)
AST: 24 U/L (ref 15–41)
Albumin: 4 g/dL (ref 3.5–5.0)
Alkaline Phosphatase: 72 U/L (ref 38–126)
Anion gap: 9 (ref 5–15)
BUN: 13 mg/dL (ref 8–23)
CO2: 26 mmol/L (ref 22–32)
Calcium: 8.8 mg/dL — ABNORMAL LOW (ref 8.9–10.3)
Chloride: 104 mmol/L (ref 98–111)
Creatinine, Ser: 0.99 mg/dL (ref 0.61–1.24)
GFR, Estimated: >60 mL/min (ref >60)
Glucose, Bld: 142 mg/dL — ABNORMAL HIGH (ref 70–99)
Potassium: 3.5 mmol/L (ref 3.5–5.1)
Sodium: 139 mmol/L (ref 135–145)
Total Bilirubin: 0.7 mg/dL (ref 0.3–1.2)
Total Protein: 6.8 g/dL (ref 6.5–8.1)

## 2023-01-14 LAB — CBC WITH DIFFERENTIAL/PLATELET
Abs Immature Granulocytes: 0.12 10*3/uL — ABNORMAL HIGH (ref 0.00–0.07)
Basophils Absolute: 0.1 10*3/uL (ref 0.0–0.1)
Basophils Relative: 1 %
Eosinophils Absolute: 0.1 10*3/uL (ref 0.0–0.5)
Eosinophils Relative: 2 %
HCT: 47.8 % (ref 39.0–52.0)
Hemoglobin: 15.8 g/dL (ref 13.0–17.0)
Immature Granulocytes: 2 %
Lymphocytes Relative: 16 %
Lymphs Abs: 0.9 10*3/uL (ref 0.7–4.0)
MCH: 29 pg (ref 26.0–34.0)
MCHC: 33.1 g/dL (ref 30.0–36.0)
MCV: 87.7 fL (ref 80.0–100.0)
Monocytes Absolute: 0.5 10*3/uL (ref 0.1–1.0)
Monocytes Relative: 9 %
Neutro Abs: 3.8 10*3/uL (ref 1.7–7.7)
Neutrophils Relative %: 70 %
Platelets: 212 10*3/uL (ref 150–400)
RBC: 5.45 MIL/uL (ref 4.22–5.81)
RDW: 13.9 % (ref 11.5–15.5)
WBC: 5.4 10*3/uL (ref 4.0–10.5)
nRBC: 0 % (ref 0.0–0.2)

## 2023-01-14 LAB — LACTATE DEHYDROGENASE: LDH: 147 U/L (ref 98–192)

## 2023-01-16 ENCOUNTER — Encounter: Payer: Self-pay | Admitting: Internal Medicine

## 2023-01-16 ENCOUNTER — Inpatient Hospital Stay (HOSPITAL_BASED_OUTPATIENT_CLINIC_OR_DEPARTMENT_OTHER): Payer: Medicare HMO | Admitting: Internal Medicine

## 2023-01-16 VITALS — BP 120/81 | HR 90 | Temp 97.9°F | Resp 18 | Wt 207.6 lb

## 2023-01-16 DIAGNOSIS — Z8572 Personal history of non-Hodgkin lymphomas: Secondary | ICD-10-CM | POA: Diagnosis not present

## 2023-01-16 DIAGNOSIS — C8203 Follicular lymphoma grade I, intra-abdominal lymph nodes: Secondary | ICD-10-CM | POA: Diagnosis not present

## 2023-01-16 NOTE — Progress Notes (Signed)
[place] OFFICE PROGRESS NOTE  Patient Care Team: Rusty Aus, MD as PCP - General (Internal Medicine) Hubbard Robinson, MD as Consulting Physician (Surgery) Efrain Sella, MD as Consulting Physician (Gastroenterology) Cammie Sickle, MD as Consulting Physician (Internal Medicine)   Cancer Staging  No matching staging information was found for the patient.   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 Recm surviellance; Dr.Crystal.  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   Patient complains of hip pain.  S/p steroid injection.  Improving.   Denies abdominal pain nausea vomiting or lumps or bumps.  Review of Systems  Constitutional:  Negative for chills, diaphoresis, fever and weight loss.  HENT:  Negative  for nosebleeds and sore throat.   Eyes:  Negative for double vision.  Respiratory:  Negative for cough, hemoptysis, sputum production, shortness of breath and wheezing.   Cardiovascular:  Negative for chest pain, palpitations, orthopnea and leg swelling.  Gastrointestinal:  Positive for constipation. Negative for abdominal pain, blood in stool, diarrhea, heartburn, melena and vomiting.       Abdominal discomfort/bloating.  Genitourinary:  Negative for dysuria, frequency and urgency.  Musculoskeletal:  Negative for back pain and joint pain.  Skin: Negative.  Negative for itching and rash.  Neurological:  Negative for dizziness, tingling, focal weakness, weakness and headaches.  Endo/Heme/Allergies:  Does not bruise/bleed easily.  Psychiatric/Behavioral:  Negative for depression. The patient is not nervous/anxious and does not have insomnia.       PAST MEDICAL HISTORY :  Past Medical History:  Diagnosis Date   Actinic keratosis    Colon polyp, hyperplastic    Crohn's disease ([place])    Follicular lymphoma ([place]) 2014   Chemo tx's.   GERD (gastroesophageal reflux disease)    Hypertension    Hypothyroid    Kidney stones    Primary Parkinson's disease    Skin cancer    R jaw - treated by Dr. Koleen Nimrod many years ago - unsure which type   Sleep apnea     PAST SURGICAL HISTORY :   Past Surgical History:  Procedure Laterality Date   BOWEL RESECTION N/A 06/19/2016   Procedure: SMALL BOWEL RESECTION;  Surgeon: Hubbard Robinson, MD;  Location: [facility] ORS;  Service: General;  Laterality: N/A;   CHOLECYSTECTOMY     COLONOSCOPY     COLONOSCOPY WITH PROPOFOL N/A 07/19/2021   Procedure: COLONOSCOPY WITH PROPOFOL;  Surgeon: Lesly Rubenstein, MD;  Location: [facility] ENDOSCOPY;  Service: Endoscopy;  Laterality: N/A;   ESOPHAGOGASTRODUODENOSCOPY     ESOPHAGOGASTRODUODENOSCOPY (EGD) WITH PROPOFOL N/A 07/19/2021   Procedure: ESOPHAGOGASTRODUODENOSCOPY (EGD) WITH PROPOFOL;  Surgeon: Lesly Rubenstein, MD;  Location: [facility] ENDOSCOPY;  Service: Endoscopy;  Laterality: N/A;   LAPAROSCOPIC REMOVAL OF MESENTERIC MASS N/A 06/19/2016   Procedure: LAPAROSCOPIC REMOVAL OF MESENTERIC MASS;  Surgeon: Hubbard Robinson, MD;  Location: [facility] ORS;  Service: General;  Laterality: N/A;   LYMPH NODE DISSECTION     PORT-A-CATH REMOVAL     PORTACATH PLACEMENT      FAMILY HISTORY :   Family History  Problem Relation Age of Onset   Clotting disorder Mother    Pulmonary embolism Mother    Lymphoma Father    Hiatal hernia Father    Heart disease Father        stent placed   Hypertension Father    Ulcers Father    Brain cancer Maternal Aunt    Cancer Paternal Uncle        esophagus    SOCIAL HISTORY:   Social History   Tobacco Use   Smoking status: Never   Smokeless tobacco: Never  Vaping Use   Vaping Use: Never used  Substance Use Topics   Alcohol use: No   Drug use: No    ALLERGIES:  has No Known Allergies.  MEDICATIONS:  Current Outpatient Medications  Medication Sig Dispense Refill   cetirizine (ZYRTEC) 10 MG tablet Take 1 tablet by mouth daily.     levothyroxine (SYNTHROID) 75 MCG tablet PLEASE SEE ATTACHED FOR DETAILED DIRECTIONS     losartan-hydrochlorothiazide (HYZAAR) 50-12.5 MG per tablet TAKE 1 TABLET DAILY.     naproxen sodium (ALEVE) 220 MG tablet Take 220 mg by mouth daily as needed.     pantoprazole (PROTONIX) 40 MG tablet Take 40 mg by mouth daily.      simvastatin (ZOCOR) 20 MG tablet Take 1 tablet by mouth daily.     tamsulosin (FLOMAX) 0.4 MG CAPS capsule Take 0.4 mg by mouth daily.     No current facility-administered medications for this visit.    PHYSICAL EXAMINATION: ECOG PERFORMANCE STATUS: 1 - Symptomatic but completely ambulatory  BP 120/81 (BP Location: Left Arm, Patient Position: Sitting)   Pulse 90   Temp 97.9 F (36.6 C) (Tympanic)   Resp 18   Wt 207 lb 9.6 oz (94.2 kg)   SpO2 99%   BMI 27.39 kg/m   Filed Weights   01/16/23 1024  Weight:  207 lb 9.6 oz (94.2 kg)   Physical Exam HENT:     Head: Normocephalic and atraumatic.     Mouth/Throat:     Pharynx: No oropharyngeal exudate.  Eyes:     Pupils: Pupils are equal, round, and reactive to light.  Cardiovascular:     Rate and Rhythm: Normal rate and regular rhythm.  Pulmonary:     Effort: No respiratory distress.     Breath sounds: No wheezing.  Abdominal:     General: Bowel sounds are normal. There is no distension.     Palpations: Abdomen is soft. There is no mass.     Tenderness: There is no abdominal tenderness. There is no guarding or rebound.     Comments: Midabdominal incision noted.  Well-healed.  Musculoskeletal:        General: No tenderness. Normal range of motion.     Cervical back: Normal range of motion and neck supple.  Skin:  General: Skin is warm.  Neurological:     Mental Status: He is alert and oriented to person, place, and time.  Psychiatric:        Mood and Affect: Affect normal.       LABORATORY DATA:  I have reviewed the data as listed    Component Value Date/Time   NA 139 01/14/2023 1101   NA 140 03/12/2015 1007   K 3.5 01/14/2023 1101   K 3.7 03/12/2015 1007   CL 104 01/14/2023 1101   CL 105 03/12/2015 1007   CO2 26 01/14/2023 1101   CO2 29 03/12/2015 1007   GLUCOSE 142 (H) 01/14/2023 1101   GLUCOSE 143 (H) 03/12/2015 1007   BUN 13 01/14/2023 1101   BUN 15 03/12/2015 1007   CREATININE 0.99 01/14/2023 1101   CREATININE 0.92 03/12/2015 1007   CALCIUM 8.8 (L) 01/14/2023 1101   CALCIUM 8.6 (L) 03/12/2015 1007   PROT 6.8 01/14/2023 1101   PROT 6.6 03/12/2015 1007   ALBUMIN 4.0 01/14/2023 1101   ALBUMIN 4.0 03/12/2015 1007   AST 24 01/14/2023 1101   AST 26 03/12/2015 1007   ALT 25 01/14/2023 1101   ALT 37 03/12/2015 1007   ALKPHOS 72 01/14/2023 1101   ALKPHOS 79 03/12/2015 1007   BILITOT 0.7 01/14/2023 1101   BILITOT 0.8 03/12/2015 1007   GFRNONAA >60 01/14/2023 1101   GFRNONAA >60 03/12/2015 1007   GFRAA >60  07/19/2020 1011   GFRAA >60 03/12/2015 1007    No results found for: "SPEP", "UPEP"  Lab Results  Component Value Date   WBC 5.4 01/14/2023   NEUTROABS 3.8 01/14/2023   HGB 15.8 01/14/2023   HCT 47.8 01/14/2023   MCV 87.7 01/14/2023   PLT 212 01/14/2023      Chemistry      Component Value Date/Time   NA 139 01/14/2023 1101   NA 140 03/12/2015 1007   K 3.5 01/14/2023 1101   K 3.7 03/12/2015 1007   CL 104 01/14/2023 1101   CL 105 03/12/2015 1007   CO2 26 01/14/2023 1101   CO2 29 03/12/2015 1007   BUN 13 01/14/2023 1101   BUN 15 03/12/2015 1007   CREATININE 0.99 01/14/2023 1101   CREATININE 0.92 03/12/2015 1007      Component Value Date/Time   CALCIUM 8.8 (L) 01/14/2023 1101   CALCIUM 8.6 (L) 03/12/2015 1007   ALKPHOS 72 01/14/2023 1101   ALKPHOS 79 03/12/2015 1007   AST 24 01/14/2023 1101   AST 26 03/12/2015 1007   ALT 25 01/14/2023 1101   ALT 37 03/12/2015 1007   BILITOT 0.7 01/14/2023 1101   BILITOT 0.8 03/12/2015 1007       RADIOGRAPHIC STUDIES: I have personally reviewed the radiological images as listed and agreed with the findings in the report. No results found.   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  Currently on surveillance; AUG 2023-enlarged but stable eccentric 3.3 x 2 cm cm in right lower quadrant.  No notes of any progression.  Recommend imaging on a yearly basis.  Will order imaging today/6 months. 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 stable  # Prostate enlargement- [on  Flomax]- recommend evaluation with urology. Defer to PCP.   my chart appt # DISPOSITION: # follow up in 6 months; MD- labs- CBC/MP/LDH;prior  CT CAP--Dr.B   Orders Placed This Encounter  Procedures   CT CHEST ABDOMEN PELVIS W CONTRAST    Standing Status:   Future    Standing Expiration Date:   01/17/2024    Order Specific Question:   Preferred imaging location?    Answer:   [facility]    Order Specific Question:   Radiology Contrast Protocol - do NOT remove file path    Answer:   [email or web address]   CBC with Differential (Cancer Center Only)    Standing Status:   Future    Standing Expiration Date:   01/17/2024   CMP ([place] only)    Standing Status:   Future    Standing Expiration Date:   01/17/2024   Lactate dehydrogenase    Standing Status:   Future    Standing Expiration Date:   01/17/2024   All questions were answered. The patient knows to call the clinic with any problems, questions or concerns.      Cammie Sickle, MD 01/16/2023 4:17 PM

## 2023-01-16 NOTE — Assessment & Plan Note (Addendum)
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  Currently on surveillance; AUG 2023-enlarged but stable eccentric 3.3 x 2 cm cm in right lower quadrant.  No notes of any progression.  Recommend imaging on a yearly basis.  Will order imaging today/6 months. 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 stable  # Prostate enlargement- [on Flomax]- recommend evaluation with urology. Defer to PCP.   my chart appt # DISPOSITION: # follow up in 6 months; MD- labs- CBC/MP/LDH;prior CT CAP--Dr.B

## 2023-01-16 NOTE — Progress Notes (Signed)
Pt having right hip pain, had steroid injection for it.  His stomach gurgles and rolls, tends to have mild constipation. Appetite is good.Denies any B symptoms. Has been diagnosed with Parkinsons.

## 2023-04-22 ENCOUNTER — Ambulatory Visit: Payer: Medicare HMO | Admitting: Dermatology

## 2023-04-22 VITALS — BP 124/79 | HR 86

## 2023-04-22 DIAGNOSIS — Z1283 Encounter for screening for malignant neoplasm of skin: Secondary | ICD-10-CM

## 2023-04-22 DIAGNOSIS — W908XXA Exposure to other nonionizing radiation, initial encounter: Secondary | ICD-10-CM | POA: Diagnosis not present

## 2023-04-22 DIAGNOSIS — L820 Inflamed seborrheic keratosis: Secondary | ICD-10-CM | POA: Diagnosis not present

## 2023-04-22 DIAGNOSIS — L570 Actinic keratosis: Secondary | ICD-10-CM | POA: Diagnosis not present

## 2023-04-22 DIAGNOSIS — L578 Other skin changes due to chronic exposure to nonionizing radiation: Secondary | ICD-10-CM

## 2023-04-22 DIAGNOSIS — L814 Other melanin hyperpigmentation: Secondary | ICD-10-CM

## 2023-04-22 DIAGNOSIS — X32XXXA Exposure to sunlight, initial encounter: Secondary | ICD-10-CM

## 2023-04-22 DIAGNOSIS — D1801 Hemangioma of skin and subcutaneous tissue: Secondary | ICD-10-CM

## 2023-04-22 DIAGNOSIS — Z872 Personal history of diseases of the skin and subcutaneous tissue: Secondary | ICD-10-CM

## 2023-04-22 DIAGNOSIS — L821 Other seborrheic keratosis: Secondary | ICD-10-CM

## 2023-04-22 DIAGNOSIS — D229 Melanocytic nevi, unspecified: Secondary | ICD-10-CM

## 2023-04-22 NOTE — Patient Instructions (Addendum)
Insect shield clothing      Cryotherapy Aftercare  Wash gently with soap and water everyday.   Apply Vaseline and Band-Aid daily until healed. ]    Due to recent changes in healthcare laws, you may see results of your pathology and/or laboratory studies on MyChart before the doctors have had a chance to review them. We understand that in some cases there may be results that are confusing or concerning to you. Please understand that not all results are received at the same time and often the doctors may need to interpret multiple results in order to provide you with the best plan of care or course of treatment. Therefore, we ask that you please give [place] 2 business days to thoroughly review all your results before contacting the office for clarification. Should we see a critical lab result, you will be contacted sooner.   If You Need Anything After Your Visit  If you have any questions or concerns for your doctor, please call our main line at [phone] and press option 4 to reach your doctor's medical assistant. If no one answers, please leave a voicemail as directed and we will return your call as soon as possible. Messages left after 4 pm will be answered the following business day.   You may also send [place] a message via MyChart. We typically respond to MyChart messages within 1-2 business days.  For prescription refills, please ask your pharmacy to contact our office. Our fax number is [phone].  If you have an urgent issue when the clinic is closed that cannot wait until the next business day, you can page your doctor at the number below.    Please note that while we do our best to be available for urgent issues outside of office hours, we are not available 24/7.   If you have an urgent issue and are unable to reach [place], you may choose to seek medical care at your doctor's office, retail clinic, urgent care center, or emergency room.  If you have a medical emergency, please  immediately call 911 or go to the emergency department.  Pager Numbers  - Dr. Gwen Pounds: [phone]  - Dr. Neale Burly: [phone]  - Dr. Roseanne Reno: [phone]  In the event of inclement weather, please call our main line at [phone] for an update on the status of any delays or closures.  Dermatology Medication Tips: Please keep the boxes that topical medications come in in order to help keep track of the instructions about where and how to use these. Pharmacies typically print the medication instructions only on the boxes and not directly on the medication tubes.   If your medication is too expensive, please contact our office at [phone] option 4 or send [place] a message through MyChart.   We are unable to tell what your co-pay for medications will be in advance as this is different depending on your insurance coverage. However, we may be able to find a substitute medication at lower cost or fill out paperwork to get insurance to cover a needed medication.   If a prior authorization is required to get your medication covered by your insurance company, please allow [place] 1-2 business days to complete this process.  Drug prices often vary depending on where the prescription is filled and some pharmacies may offer cheaper prices.  The website www.goodrx.com contains coupons for medications through different pharmacies. The prices here do not account for what the cost may be with help from insurance (it may be  cheaper with your insurance), but the website can give you the price if you did not use any insurance.  - You can print the associated coupon and take it with your prescription to the pharmacy.  - You may also stop by our office during regular business hours and pick up a GoodRx coupon card.  - If you need your prescription sent electronically to a different pharmacy, notify our office through [facility] or by phone at [phone] option 4.     Si Usted Necesita Algo Despus  de Su Visita  Tambin puede enviarnos un mensaje a travs de Clinical cytogeneticist. Por lo general respondemos a los mensajes de MyChart en el transcurso de 1 a 2 das hbiles.  Para renovar recetas, por favor pida a su farmacia que se ponga en contacto con nuestra oficina. Annie Sable de fax es [place] [phone].  Si tiene un asunto urgente cuando la clnica est cerrada y que no puede esperar hasta el siguiente da hbil, puede llamar/localizar a su doctor(a) al nmero que aparece a continuacin.   Por favor, tenga en cuenta que aunque hacemos todo lo posible para estar disponibles para asuntos urgentes fuera del horario de [place], no estamos disponibles las 24 horas del da, los 7 [address]  [address] de la [place].   Si tiene un problema urgente y no puede comunicarse con nosotros, puede optar por buscar atencin mdica  en el consultorio de su doctor(a), en una clnica privada, en un centro de atencin urgente o en una sala de emergencias.  Si tiene Engineer, drilling, por favor llame inmediatamente al 911 o vaya a la sala de emergencias.  Nmeros de bper  - Dr. Gwen Pounds: [phone]  - Dra. Moye: [phone]  - Dra. Roseanne Reno: [phone]  En caso de inclemencias del [place], por favor llame a Lacy Duverney principal al [phone] para una actualizacin sobre el [place] de cualquier retraso o cierre.  Consejos para la medicacin en dermatologa: Por favor, guarde las cajas en las que vienen los medicamentos de uso tpico para ayudarle a seguir las instrucciones sobre dnde y cmo usarlos. Las farmacias generalmente imprimen las instrucciones del medicamento slo en las cajas y no directamente en los tubos del [place].   Si su medicamento es muy caro, por favor, pngase en contacto con Rolm Gala llamando al [phone] y presione la opcin 4 o envenos un mensaje a travs de Clinical cytogeneticist.   No podemos decirle cul ser su copago por los medicamentos por adelantado ya que esto es diferente dependiendo  de la cobertura de su seguro. Sin embargo, es posible que podamos encontrar un medicamento sustituto a Audiological scientist un formulario para que el seguro cubra el medicamento que se considera necesario.   Si se requiere una autorizacin previa para que su compaa de seguros [place] su medicamento, por favor permtanos de 1 a 2 das hbiles para completar [address]  Los precios de los medicamentos varan con frecuencia dependiendo del Environmental consultant de dnde se surte la receta y alguna farmacias pueden ofrecer precios ms baratos.  El sitio web www.goodrx.com tiene cupones para medicamentos de Health and safety inspector. Los precios aqu no tienen en cuenta lo que podra costar con la ayuda del seguro (puede ser ms barato con su seguro), pero el sitio web puede darle el precio si no utiliz Tourist information centre manager.  - Puede imprimir el cupn correspondiente y llevarlo con su receta a la farmacia.  - Tambin puede pasar por nuestra oficina durante el horario de atencin regular y Education officer, museum  una tarjeta de cupones de GoodRx.  - Si necesita que su receta se enve electrnicamente a una farmacia diferente, informe a nuestra oficina a travs de MyChart de [place] o por telfono llamando al [phone] y presione la opcin 4.

## 2023-04-22 NOTE — Progress Notes (Signed)
Follow-Up Visit   Subjective  Troy Reynolds is a 72 y.o. male who presents for the following: Skin Cancer Screening and Full Body Skin Exam, hx of precancers.  The patient presents for Total-Body Skin Exam (TBSE) for skin cancer screening and mole check. The patient has spots, moles and lesions to be evaluated, some may be new or changing and the patient has concerns that these could be cancer.    The following portions of the chart were reviewed this encounter and updated as appropriate: medications, allergies, medical history  Review of Systems:  No other skin or systemic complaints except as noted in HPI or Assessment and Plan.  Objective  Well appearing patient in no apparent distress; mood and affect are within normal limits.  A full examination was performed including scalp, head, eyes, ears, nose, lips, neck, chest, axillae, abdomen, back, buttocks, bilateral upper extremities, bilateral lower extremities, hands, feet, fingers, toes, fingernails, and toenails. All findings within normal limits unless otherwise noted below.   Relevant physical exam findings are noted in the Assessment and Plan.  scalp, face x 4 (4) Erythematous thin papules/macules with gritty scale.   left forehead x 2 (2) Stuck-on, waxy, tan-brown papule or plaque --Discussed benign etiology and prognosis.     Assessment & Plan   LENTIGINES, SEBORRHEIC KERATOSES, HEMANGIOMAS - Benign normal skin lesions - Benign-appearing - Call for any changes  MELANOCYTIC NEVI - Tan-brown and/or pink-flesh-colored symmetric macules and papules - Benign appearing on exam today - Observation - Call clinic for new or changing moles - Recommend daily use of broad spectrum spf 30+ sunscreen to sun-exposed areas.   ACTINIC DAMAGE - Chronic condition, secondary to cumulative UV/sun exposure - diffuse scaly erythematous macules with underlying dyspigmentation - Recommend daily broad spectrum sunscreen SPF 30+ to  sun-exposed areas, reapply every 2 hours as needed.  - Staying in the shade or wearing long sleeves, sun glasses (UVA+UVB protection) and wide brim hats (4-inch brim around the entire circumference of the hat) are also recommended for sun protection.  - Call for new or changing lesions.  SKIN CANCER SCREENING PERFORMED TODAY.   AK (actinic keratosis) (4) scalp, face x 4  Actinic keratoses are precancerous spots that appear secondary to cumulative UV radiation exposure/sun exposure over time. They are chronic with expected duration over 1 year. A portion of actinic keratoses will progress to squamous cell carcinoma of the skin. It is not possible to reliably predict which spots will progress to skin cancer and so treatment is recommended to prevent development of skin cancer.  Recommend daily broad spectrum sunscreen SPF 30+ to sun-exposed areas, reapply every 2 hours as needed.  Recommend staying in the shade or wearing long sleeves, sun glasses (UVA+UVB protection) and wide brim hats (4-inch brim around the entire circumference of the hat). Call for new or changing lesions.   Destruction of lesion - scalp, face x 4 Complexity: simple   Destruction method: cryotherapy   Informed consent: discussed and consent obtained   Timeout:  patient name, date of birth, surgical site, and procedure verified Lesion destroyed using liquid nitrogen: Yes   Region frozen until ice ball extended beyond lesion: Yes   Outcome: patient tolerated procedure well with no complications   Post-procedure details: wound care instructions given    Inflamed seborrheic keratosis (2) left forehead x 2  Symptomatic, irritating, patient would like treated.   Destruction of lesion - left forehead x 2 Complexity: simple   Destruction method: cryotherapy  Informed consent: discussed and consent obtained   Timeout:  patient name, date of birth, surgical site, and procedure verified Lesion destroyed using liquid  nitrogen: Yes   Region frozen until ice ball extended beyond lesion: Yes   Outcome: patient tolerated procedure well with no complications   Post-procedure details: wound care instructions given     Return in about 1 year (around 04/21/2024) for TBSE, hx of AKs, Hx of ISK .  I, Angelique Holm, CMA, am acting as scribe for Armida Sans, MD .   Documentation: I have reviewed the above documentation for accuracy and completeness, and I agree with the above.  Armida Sans, MD

## 2023-04-29 ENCOUNTER — Encounter: Payer: Self-pay | Admitting: Dermatology

## 2023-05-12 ENCOUNTER — Ambulatory Visit: Payer: Self-pay | Admitting: General Surgery

## 2023-05-12 NOTE — H&P (Signed)
PATIENT PROFILE: Troy Reynolds is a 72 y.o. male who presents to the Clinic for consultation at the request of Dr. Hyacinth Meeker for evaluation of left groin hernia.  PCP:  Carlynn Purl, MD  HISTORY OF PRESENT ILLNESS: Troy Reynolds reports he has been feeling a bulge in the left groin for at least the last 6 months.  He endorses that the last few weeks he has been feeling more discomfort and pain on the left groin.  The pain is aggravated by coughing and sneezing.  Alleviating factor is sitting down or resting.  He endorses pain radiating to the left scrotum.  He also feels numbness on the left upper medial thigh.  Denies any episode of abdominal distention, nausea or vomiting.  Upon evaluation of CT scan done on July 02, 2022, there is evidence of bilateral inguinal hernia.  As per patient he has minimal symptoms on the right groin.  By imaging patient with bilateral inguinal hernia.   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   Overview    Slight PET progression 9/20 right mesenteric lymph node      Thoracic aortic aneurysm without rupture (CMS-HCC) 09/14/2019   Overview    4.2 cm, stable, 8/22 CT stable, [place] thoracic following      Hyperlipidemia, mixed 09/14/2019   History of 2019 novel coronavirus disease (COVID-19) 06/23/2019   Overview    6/20, associated with bilateral pneumonia      OSA on CPAP 09/29/2018   Overview    Mild abnormal study, 11/19      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 08/17/2018   Bundle branch block, right 12/28/2014    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   Overview    Recurrent 2017, intra-abdominal lymph nodes, NED 2020      Essential hypertension 08/25/2014   Crohn's disease of colon without complication (CMS-HCC) 08/25/2014   Overview    Normal small bowel series, 9/17       GENERAL REVIEW OF SYSTEMS:   General ROS: negative for - chills, fatigue, fever, weight gain or weight loss Allergy and Immunology ROS: negative for - hives  Hematological and Lymphatic ROS: negative for - bleeding problems or bruising, negative for palpable nodes Endocrine ROS: negative for - heat or cold intolerance, hair changes Respiratory ROS: negative for - cough, shortness of breath or wheezing Cardiovascular ROS: no chest pain or palpitations GI ROS: negative for nausea, vomiting, abdominal pain, diarrhea, constipation Musculoskeletal ROS: negative for - joint swelling or muscle pain Neurological ROS: negative for - confusion, syncope Dermatological ROS: negative for pruritus and rash Psychiatric: negative for anxiety, depression, difficulty sleeping and memory loss  MEDICATIONS: Current Outpatient Medications  Medication Sig Dispense Refill   Herbal Supplement Herbal Name: B12     levothyroxine (SYNTHROID) 100 MCG tablet Take 1 tablet (100 mcg total) by mouth once daily Take on an empty stomach with a glass of water at least 30-60 minutes before breakfast. 90  tablet 3   losartan-hydroCHLOROthiazide (HYZAAR) 50-12.5 mg tablet Take 1 tablet by mouth once daily 90 tablet 3   naproxen sodium (ALEVE) 220 MG tablet Take 220 mg by mouth once daily as needed for Pain     pantoprazole (PROTONIX) 40 MG DR tablet TAKE 1 TABLET BY MOUTH EVERY DAY 90 tablet 3   simvastatin (ZOCOR) 20 MG tablet Take 1 tablet (20 mg total) by mouth at bedtime 90 tablet 3   tamsulosin (FLOMAX) 0.4 mg capsule TAKE 1 CAPSULE (0.4 MG TOTAL) BY MOUTH ONCE DAILY, 30 MINUTES AFTER SAME MEAL EACH DAY. 90 capsule 3   No current  facility-administered medications for this visit.    ALLERGIES: Patient has no known allergies.  PAST MEDICAL HISTORY: Past Medical History:  Diagnosis Date   Colon polyp 06/05/14   hyperplastic   Crohn's disease (CMS/HHS-HCC)    In Remission   GERD (gastroesophageal reflux disease) 06/05/14   Kidney stones    OSA on CPAP 09/29/2018   Mild abnormal study, 11/19   Primary Parkinson's disease 03/25/2021    PAST SURGICAL HISTORY: Past Surgical History:  Procedure Laterality Date   COLONOSCOPY  06/05/2014   EGD  06/05/2014   Lymphoma abdominal resection  05/2016   COLONOSCOPY  07/19/2021   Tubular adenoma/PHx IBD/Repeat 51yrs/CTL   EGD  07/19/2021   Fundic gland polyp/Repeat as needed/CTL   CHOLECYSTECTOMY       FAMILY HISTORY: Family History  Problem Relation Name Age of Onset   Pulmonary embolism Mother     High blood pressure (Hypertension) Father     Lymphoma Father     Ulcers Father       SOCIAL HISTORY: Social History   Socioeconomic History   Marital status: Widowed  Tobacco Use   Smoking status: Never   Smokeless tobacco: Never  Vaping Use   Vaping status: Never Used  Substance and Sexual Activity   Alcohol use: Not Currently    Alcohol/week: 0.0 standard drinks of alcohol   Drug use: No   Sexual activity: Not Currently  Social History Narrative   Lives alone. Son close by.    PHYSICAL EXAM: Vitals:   05/12/23 0724  BP: (!) 141/91  Pulse: 81   Body mass index is 27.67 kg/m. Weight: 92.5 kg (204 lb)   GENERAL: Alert, active, oriented x3  HEENT: Pupils equal reactive to light. Extraocular movements are intact. Sclera clear. Palpebral conjunctiva normal red color.Pharynx clear.  NECK: Supple with no palpable mass and no adenopathy.  LUNGS: Sound clear with no rales rhonchi or wheezes.  HEART: Regular rhythm S1 and S2 without murmur.  ABDOMEN: Soft and depressible, nontender with no palpable mass, no hepatomegaly.  Bilateral reducible  inguinal hernias.  EXTREMITIES: Well-developed well-nourished symmetrical with no dependent edema.  NEUROLOGICAL: Awake alert oriented, facial expression symmetrical, moving all extremities.  REVIEW OF DATA: I have reviewed the following data today: Appointment on 04/28/2023  Component Date Value   WBC (White Blood Cell Co* 04/28/2023 6.0    RBC (Red Blood Cell Coun* 04/28/2023 5.78    Hemoglobin 04/28/2023 16.8    Hematocrit 04/28/2023 49.5    MCV (Mean Corpuscular Vo* 04/28/2023 85.6    MCH (Mean Corpuscular He* 04/28/2023 29.1    MCHC (Mean Corpuscular H* 04/28/2023 33.9    Platelet Count 04/28/2023 209    RDW-CV (Red Cell Distrib* 04/28/2023 13.4    MPV (Mean Platelet Volum* 04/28/2023 9.3 (L)    Neutrophils 04/28/2023 3.81    Lymphocytes 04/28/2023  1.25    Monocytes 04/28/2023 0.64    Eosinophils 04/28/2023 0.18    Basophils 04/28/2023 0.06    Neutrophil % 04/28/2023 63.7    Lymphocyte % 04/28/2023 20.9    Monocyte % 04/28/2023 10.7    Eosinophil % 04/28/2023 3.0    Basophil% 04/28/2023 1.0    Immature Granulocyte % 04/28/2023 0.7    Immature Granulocyte Cou* 04/28/2023 0.04    Glucose 04/28/2023 98    Sodium 04/28/2023 141    Potassium 04/28/2023 4.0    Chloride 04/28/2023 105    Carbon Dioxide (CO2) 04/28/2023 29.5    Urea Nitrogen (BUN) 04/28/2023 15    Creatinine 04/28/2023 1.0    Glomerular Filtration Ra* 04/28/2023 80    Calcium 04/28/2023 9.4    AST  04/28/2023 18    ALT  04/28/2023 24    Alk Phos (alkaline Phosp* 04/28/2023 76    Albumin 04/28/2023 4.4    Bilirubin, Total 04/28/2023 0.8    Protein, Total 04/28/2023 6.5    A/G Ratio 04/28/2023 2.1    Cholesterol, Total 04/28/2023 139    Triglyceride 04/28/2023 98    HDL (High Density Lipopr* 16/08/9603 52.4    LDL Calculated 04/28/2023 67    VLDL Cholesterol 04/28/2023 20    Cholesterol/HDL Ratio 04/28/2023 2.7    Vitamin B12 04/28/2023 676    Thyroid Stimulating Horm* 04/28/2023 2.111       ASSESSMENT: Troy Reynolds is a 72 y.o. male presenting for consultation for bilateral inguinal hernia (left inguinal hernia more symptomatic).    The patient presents with a symptomatic, reducible bilateral inguinal hernia (left inguinal hernia more symptomatic). Patient was oriented about the diagnosis of inguinal hernia and its implication. The patient was oriented about the treatment alternatives (observation vs surgical repair). Due to patient symptoms, repair is recommended. Patient oriented about the surgical procedure, the use of mesh and its risk of complications such as: infection, bleeding, injury to vas deference, vasculature and testicle, injury to bowel or bladder, and chronic pain.   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  Big midline scar.  Discussed with patient that if there is a lot of scar tissue on the midline we might need to do the open approach repair of the left inguinal hernia which is stable symptomatic one.  Non-recurrent unilateral inguinal hernia without obstruction or gangrene [K40.90]  PLAN: 1.  Robotic assisted laparoscopic bilateral inguinal hernia repair with mesh (54098) 2.  CBC, CMP (Done) 3.  Avoid taking aspirin 5 days before procedure 4.  Contact us if has any question or concern.       Patient verbalized understanding, all questions were answered, and were agreeable with the plan outlined above.    Carolan Shiver, MD  Electronically signed by Carolan Shiver, MD

## 2023-05-12 NOTE — H&P (View-Only) (Signed)
PATIENT PROFILE: Troy Reynolds is a 72 y.o. male who presents to the Clinic for consultation at the request of Dr. Miller for evaluation of left groin hernia.  PCP:  Miller, Mark Frederic, MD  HISTORY OF PRESENT ILLNESS: Mr. Rumler reports he has been feeling a bulge in the left groin for at least the last 6 months.  He endorses that the last few weeks he has been feeling more discomfort and pain on the left groin.  The pain is aggravated by coughing and sneezing.  Alleviating factor is sitting down or resting.  He endorses pain radiating to the left scrotum.  He also feels numbness on the left upper medial thigh.  Denies any episode of abdominal distention, nausea or vomiting.  Upon evaluation of CT scan done on July 02, 2022, there is evidence of bilateral inguinal hernia.  As per patient he has minimal symptoms on the right groin.  By imaging patient with bilateral inguinal hernia.   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   Overview    Slight PET progression 9/20 right mesenteric lymph node      Thoracic aortic aneurysm without rupture (CMS-HCC) 09/14/2019   Overview    4.2 cm, stable, 8/22 CT stable, [facility] following      Hyperlipidemia, mixed 09/14/2019   History of 2019 novel coronavirus disease (COVID-19) 06/23/2019   Overview    6/20, associated with bilateral pneumonia      OSA on CPAP 09/29/2018   Overview    Mild abnormal study, 11/19      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 08/17/2018   Bundle branch block, right 12/28/2014    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   Overview    Recurrent 2017, intra-abdominal lymph nodes, NED 2020      Essential hypertension 08/25/2014   Crohn's disease of colon without complication (CMS-HCC) 08/25/2014   Overview    Normal small bowel series, 9/17       GENERAL REVIEW OF SYSTEMS:   General ROS: negative for - chills, fatigue, fever, weight gain or weight loss Allergy and Immunology ROS: negative for - hives  Hematological and Lymphatic ROS: negative for - bleeding problems or bruising, negative for palpable nodes Endocrine ROS: negative for - heat or cold intolerance, hair changes Respiratory ROS: negative for - cough, shortness of breath or wheezing Cardiovascular ROS: no chest pain or palpitations GI ROS: negative for nausea, vomiting, abdominal pain, diarrhea, constipation Musculoskeletal ROS: negative for - joint swelling or muscle pain Neurological ROS: negative for - confusion, syncope Dermatological ROS: negative for pruritus and rash Psychiatric: negative for anxiety, depression, difficulty sleeping and memory loss  MEDICATIONS: Current Outpatient Medications  Medication Sig Dispense Refill   Herbal Supplement Herbal Name: B12     levothyroxine (SYNTHROID) 100 MCG tablet Take 1 tablet (100 mcg total) by mouth once daily Take on an empty stomach with a glass of water at least 30-60 minutes before breakfast. 90   tablet 3   losartan-hydroCHLOROthiazide (HYZAAR) 50-12.5 mg tablet Take 1 tablet by mouth once daily 90 tablet 3   naproxen sodium (ALEVE) 220 MG tablet Take 220 mg by mouth once daily as needed for Pain     pantoprazole (PROTONIX) 40 MG DR tablet TAKE 1 TABLET BY MOUTH EVERY DAY 90 tablet 3   simvastatin (ZOCOR) 20 MG tablet Take 1 tablet (20 mg total) by mouth at bedtime 90 tablet 3   tamsulosin (FLOMAX) 0.4 mg capsule TAKE 1 CAPSULE (0.4 MG TOTAL) BY MOUTH ONCE DAILY, 30 MINUTES AFTER SAME MEAL EACH DAY. 90 capsule 3   No current  facility-administered medications for this visit.    ALLERGIES: Patient has no known allergies.  PAST MEDICAL HISTORY: Past Medical History:  Diagnosis Date   Colon polyp 06/05/14   hyperplastic   Crohn's disease (CMS/HHS-HCC)    In Remission   GERD (gastroesophageal reflux disease) 06/05/14   Kidney stones    OSA on CPAP 09/29/2018   Mild abnormal study, 11/19   Primary Parkinson's disease 03/25/2021    PAST SURGICAL HISTORY: Past Surgical History:  Procedure Laterality Date   COLONOSCOPY  06/05/2014   EGD  06/05/2014   Lymphoma abdominal resection  05/2016   COLONOSCOPY  07/19/2021   Tubular adenoma/PHx IBD/Repeat 7yrs/CTL   EGD  07/19/2021   Fundic gland polyp/Repeat as needed/CTL   CHOLECYSTECTOMY       FAMILY HISTORY: Family History  Problem Relation Name Age of Onset   Pulmonary embolism Mother     High blood pressure (Hypertension) Father     Lymphoma Father     Ulcers Father       SOCIAL HISTORY: Social History   Socioeconomic History   Marital status: Widowed  Tobacco Use   Smoking status: Never   Smokeless tobacco: Never  Vaping Use   Vaping status: Never Used  Substance and Sexual Activity   Alcohol use: Not Currently    Alcohol/week: 0.0 standard drinks of alcohol   Drug use: No   Sexual activity: Not Currently  Social History Narrative   Lives alone. Son close by.    PHYSICAL EXAM: Vitals:   05/12/23 0724  BP: (!) 141/91  Pulse: 81   Body mass index is 27.67 kg/m. Weight: 92.5 kg (204 lb)   GENERAL: Alert, active, oriented x3  HEENT: Pupils equal reactive to light. Extraocular movements are intact. Sclera clear. Palpebral conjunctiva normal red color.Pharynx clear.  NECK: Supple with no palpable mass and no adenopathy.  LUNGS: Sound clear with no rales rhonchi or wheezes.  HEART: Regular rhythm S1 and S2 without murmur.  ABDOMEN: Soft and depressible, nontender with no palpable mass, no hepatomegaly.  Bilateral reducible  inguinal hernias.  EXTREMITIES: Well-developed well-nourished symmetrical with no dependent edema.  NEUROLOGICAL: Awake alert oriented, facial expression symmetrical, moving all extremities.  REVIEW OF DATA: I have reviewed the following data today: Appointment on 04/28/2023  Component Date Value   WBC (White Blood Cell Co* 04/28/2023 6.0    RBC (Red Blood Cell Coun* 04/28/2023 5.78    Hemoglobin 04/28/2023 16.8    Hematocrit 04/28/2023 49.5    MCV (Mean Corpuscular Vo* 04/28/2023 85.6    MCH (Mean Corpuscular He* 04/28/2023 29.1    MCHC (Mean Corpuscular H* 04/28/2023 33.9    Platelet Count 04/28/2023 209    RDW-CV (Red Cell Distrib* 04/28/2023 13.4    MPV (Mean Platelet Volum* 04/28/2023 9.3 (L)    Neutrophils 04/28/2023 3.81    Lymphocytes 04/28/2023   1.25    Monocytes 04/28/2023 0.64    Eosinophils 04/28/2023 0.18    Basophils 04/28/2023 0.06    Neutrophil % 04/28/2023 63.7    Lymphocyte % 04/28/2023 20.9    Monocyte % 04/28/2023 10.7    Eosinophil % 04/28/2023 3.0    Basophil% 04/28/2023 1.0    Immature Granulocyte % 04/28/2023 0.7    Immature Granulocyte Cou* 04/28/2023 0.04    Glucose 04/28/2023 98    Sodium 04/28/2023 141    Potassium 04/28/2023 4.0    Chloride 04/28/2023 105    Carbon Dioxide (CO2) 04/28/2023 29.5    Urea Nitrogen (BUN) 04/28/2023 15    Creatinine 04/28/2023 1.0    Glomerular Filtration Ra* 04/28/2023 80    Calcium 04/28/2023 9.4    AST  04/28/2023 18    ALT  04/28/2023 24    Alk Phos (alkaline Phosp* 04/28/2023 76    Albumin 04/28/2023 4.4    Bilirubin, Total 04/28/2023 0.8    Protein, Total 04/28/2023 6.5    A/G Ratio 04/28/2023 2.1    Cholesterol, Total 04/28/2023 139    Triglyceride 04/28/2023 98    HDL (High Density Lipopr* 04/28/2023 52.4    LDL Calculated 04/28/2023 67    VLDL Cholesterol 04/28/2023 20    Cholesterol/HDL Ratio 04/28/2023 2.7    Vitamin B12 04/28/2023 676    Thyroid Stimulating Horm* 04/28/2023 2.111       ASSESSMENT: Mr. Ziemba is a 72 y.o. male presenting for consultation for bilateral inguinal hernia (left inguinal hernia more symptomatic).    The patient presents with a symptomatic, reducible bilateral inguinal hernia (left inguinal hernia more symptomatic). Patient was oriented about the diagnosis of inguinal hernia and its implication. The patient was oriented about the treatment alternatives (observation vs surgical repair). Due to patient symptoms, repair is recommended. Patient oriented about the surgical procedure, the use of mesh and its risk of complications such as: infection, bleeding, injury to vas deference, vasculature and testicle, injury to bowel or bladder, and chronic pain.   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  Big midline scar.  Discussed with patient that if there is a lot of scar tissue on the midline we might need to do the open approach repair of the left inguinal hernia which is stable symptomatic one.  Non-recurrent unilateral inguinal hernia without obstruction or gangrene [K40.90]  PLAN: 1.  Robotic assisted laparoscopic bilateral inguinal hernia repair with mesh ([id number]) 2.  CBC, CMP (Done) 3.  Avoid taking aspirin 5 days before procedure 4.  Contact us if has any question or concern.       Patient verbalized understanding, all questions were answered, and were agreeable with the plan outlined above.    Shandell Jallow Cintron-Diaz, MD  Electronically signed by Cathyrn Deas Cintron-Diaz, MD  

## 2023-05-20 ENCOUNTER — Other Ambulatory Visit: Payer: Self-pay

## 2023-05-20 ENCOUNTER — Encounter
Admission: RE | Admit: 2023-05-20 | Discharge: 2023-05-20 | Disposition: A | Discharge status: Home / Self Care | Payer: Medicare HMO | Source: Ambulatory Visit | Attending: General Surgery | Admitting: General Surgery

## 2023-05-20 DIAGNOSIS — I10 Essential (primary) hypertension: Secondary | ICD-10-CM

## 2023-05-20 DIAGNOSIS — Z01812 Encounter for preprocedural laboratory examination: Secondary | ICD-10-CM

## 2023-05-20 HISTORY — DX: Unspecified osteoarthritis, unspecified site: M19.90

## 2023-05-20 NOTE — Patient Instructions (Addendum)
Your procedure is scheduled on: 05/29/23 - Friday Report to the Registration Desk on the 1st floor of the [facility]. To find out your arrival time, please call [phone] between 1PM - 3PM on: 05/27/23 - Wednesday If your arrival time is 6:00 am, do not arrive before that time as the [facility] entrance doors do not open until 6:00 am.  REMEMBER: Instructions that are not followed completely may result in serious medical risk, up to and including death; or upon the discretion of your surgeon and anesthesiologist your surgery may need to be rescheduled.  Do not eat food or drink any liquids after midnight the night before surgery.  No gum chewing or hard candies.   One week prior to surgery: Stop Anti-inflammatories (NSAIDS) such as Advil, Aleve, Ibuprofen, Motrin, Naproxen, Naprosyn and Aspirin based products such as Excedrin, Goody's Powder, BC Powder.  Stop ANY OVER THE COUNTER supplements until after surgery.  You may take Tylenol if needed for pain up until the day of surgery.   TAKE ONLY THESE MEDICATIONS THE MORNING OF SURGERY WITH A SIP OF WATER:  pantoprazole (PROTONIX)  (take one the night before and one on the morning of surgery - helps to prevent nausea after surgery.) levothyroxine (SYNTHROID)    No Alcohol for 24 hours before or after surgery.  No Smoking including e-cigarettes for 24 hours before surgery.  No chewable tobacco products for at least 6 hours before surgery.  No nicotine patches on the day of surgery.  Do not use any "recreational" drugs for at least a week (preferably 2 weeks) before your surgery.  Please be advised that the combination of cocaine and anesthesia may have negative outcomes, up to and including death. If you test positive for cocaine, your surgery will be cancelled.  On the morning of surgery brush your teeth with toothpaste and water, you may rinse your mouth with mouthwash if you wish. Do not swallow any toothpaste or  mouthwash.  Use CHG Soap or wipes as directed on instruction sheet.  Do not wear jewelry, make-up, hairpins, clips or nail polish.  Do not wear lotions, powders, or perfumes.   Do not shave body hair from the neck down 48 hours before surgery.  Contact lenses, hearing aids and dentures may not be worn into surgery.  Do not bring valuables to the hospital. [facility] is not responsible for any missing/lost belongings or valuables.   Notify your doctor if there is any change in your medical condition (cold, fever, infection).  Wear comfortable clothing (specific to your surgery type) to the hospital.  After surgery, you can help prevent lung complications by doing breathing exercises.  Take deep breaths and cough every 1-2 hours. Your doctor may order a device called an Incentive Spirometer to help you take deep breaths. When coughing or sneezing, hold a pillow firmly against your incision with both hands. This is called "splinting." Doing this helps protect your incision. It also decreases belly discomfort.  If you are being admitted to the hospital overnight, leave your suitcase in the car. After surgery it may be brought to your room.  In case of increased patient census, it may be necessary for you, the patient, to continue your postoperative care in the Same Day Surgery department.  If you are being discharged the day of surgery, you will not be allowed to drive home. You will need a responsible individual to drive you home and stay with you for 24 hours after surgery.  If you are taking public transportation, you will need to have a responsible individual with you.  Please call the Pre-admissions Testing Dept. at [phone] if you have any questions about these instructions.  Surgery Visitation Policy:  Patients having surgery or a procedure may have two visitors.  Children under the age of 30 must have an adult with them who is not the patient.  Inpatient Visitation:     Visiting hours are 7 a.m. to 8 p.m. Up to four visitors are allowed at one time in a patient room. The visitors may rotate out with other people during the day.  One visitor age 23 or older may stay with the patient overnight and must be in the room by 8 p.m.    Preparing for Surgery with CHLORHEXIDINE GLUCONATE (CHG) Soap  Chlorhexidine Gluconate (CHG) Soap  o An antiseptic cleaner that kills germs and bonds with the skin to continue killing germs even after washing  o Used for showering the night before surgery and morning of surgery  Before surgery, you can play an important role by reducing the number of germs on your skin.  CHG (Chlorhexidine gluconate) soap is an antiseptic cleanser which kills germs and bonds with the skin to continue killing germs even after washing.  Please do not use if you have an allergy to CHG or antibacterial soaps. If your skin becomes reddened/irritated stop using the CHG.  1. Shower the NIGHT BEFORE SURGERY and the MORNING OF SURGERY with CHG soap.  2. If you choose to wash your hair, wash your hair first as usual with your normal shampoo.  3. After shampooing, rinse your hair and body thoroughly to remove the shampoo.  4. Use CHG as you would any other liquid soap. You can apply CHG directly to the skin and wash gently with a scrungie or a clean washcloth.  5. Apply the CHG soap to your body only from the neck down. Do not use on open wounds or open sores. Avoid contact with your eyes, ears, mouth, and genitals (private parts). Wash face and genitals (private parts) with your normal soap.  6. Wash thoroughly, paying special attention to the area where your surgery will be performed.  7. Thoroughly rinse your body with warm water.  8. Do not shower/wash with your normal soap after using and rinsing off the CHG soap.  9. Pat yourself dry with a clean towel.  10. Wear clean pajamas to bed the night before surgery.  12. Place clean sheets on  your bed the night of your first shower and do not sleep with pets.  13. Shower again with the CHG soap on the day of surgery prior to arriving at the hospital.  14. Do not apply any deodorants/lotions/powders.  15. Please wear clean clothes to the hospital.

## 2023-05-21 ENCOUNTER — Encounter
Admission: RE | Admit: 2023-05-21 | Discharge: 2023-05-21 | Disposition: A | Discharge status: Home / Self Care | Payer: Medicare HMO | Source: Ambulatory Visit | Attending: General Surgery | Admitting: General Surgery

## 2023-05-21 DIAGNOSIS — I10 Essential (primary) hypertension: Secondary | ICD-10-CM | POA: Insufficient documentation

## 2023-05-21 DIAGNOSIS — Z01810 Encounter for preprocedural cardiovascular examination: Secondary | ICD-10-CM | POA: Insufficient documentation

## 2023-05-21 DIAGNOSIS — I4510 Unspecified right bundle-branch block: Secondary | ICD-10-CM | POA: Insufficient documentation

## 2023-05-22 ENCOUNTER — Encounter: Payer: Self-pay | Admitting: General Surgery

## 2023-05-28 MED ORDER — LACTATED RINGERS IV SOLN: INTRAVENOUS | Status: DC

## 2023-05-28 MED ORDER — CEFAZOLIN SODIUM-DEXTROSE 2-4 GM/100ML-% IV SOLN
2.0000 g | INTRAVENOUS | Status: AC
  Administered 2023-05-29: 2 g via INTRAVENOUS

## 2023-05-28 MED ORDER — CHLORHEXIDINE GLUCONATE 0.12 % MT SOLN
15.0000 mL | Freq: Once | OROMUCOSAL | Status: AC
  Administered 2023-05-29: 15 mL via OROMUCOSAL

## 2023-05-28 MED ORDER — ORAL CARE MOUTH RINSE: 15.0000 mL | Freq: Once | OROMUCOSAL | Status: AC

## 2023-05-29 ENCOUNTER — Other Ambulatory Visit: Payer: Self-pay

## 2023-05-29 ENCOUNTER — Ambulatory Visit
Admission: RE | Admit: 2023-05-29 | Discharge: 2023-05-29 | Disposition: A | Discharge status: Home / Self Care | Payer: Medicare HMO | Source: Home / Self Care | Attending: General Surgery | Admitting: General Surgery

## 2023-05-29 ENCOUNTER — Ambulatory Visit: Payer: Medicare HMO | Admitting: Urgent Care

## 2023-05-29 ENCOUNTER — Encounter: Payer: Self-pay | Admitting: General Surgery

## 2023-05-29 ENCOUNTER — Encounter
Admission: RE | Disposition: A | Discharge status: Home / Self Care | Payer: Self-pay | Source: Home / Self Care | Attending: General Surgery

## 2023-05-29 DIAGNOSIS — I10 Essential (primary) hypertension: Secondary | ICD-10-CM | POA: Insufficient documentation

## 2023-05-29 DIAGNOSIS — K219 Gastro-esophageal reflux disease without esophagitis: Secondary | ICD-10-CM | POA: Diagnosis not present

## 2023-05-29 DIAGNOSIS — Z79899 Other long term (current) drug therapy: Secondary | ICD-10-CM | POA: Insufficient documentation

## 2023-05-29 DIAGNOSIS — E039 Hypothyroidism, unspecified: Secondary | ICD-10-CM | POA: Insufficient documentation

## 2023-05-29 DIAGNOSIS — G4733 Obstructive sleep apnea (adult) (pediatric): Secondary | ICD-10-CM | POA: Insufficient documentation

## 2023-05-29 DIAGNOSIS — Z8673 Personal history of transient ischemic attack (TIA), and cerebral infarction without residual deficits: Secondary | ICD-10-CM | POA: Insufficient documentation

## 2023-05-29 DIAGNOSIS — G20A1 Parkinson's disease without dyskinesia, without mention of fluctuations: Secondary | ICD-10-CM | POA: Diagnosis not present

## 2023-05-29 DIAGNOSIS — K449 Diaphragmatic hernia without obstruction or gangrene: Secondary | ICD-10-CM | POA: Diagnosis not present

## 2023-05-29 DIAGNOSIS — K4020 Bilateral inguinal hernia, without obstruction or gangrene, not specified as recurrent: Secondary | ICD-10-CM | POA: Diagnosis present

## 2023-05-29 DIAGNOSIS — Z8249 Family history of ischemic heart disease and other diseases of the circulatory system: Secondary | ICD-10-CM | POA: Insufficient documentation

## 2023-05-29 HISTORY — DX: Obstructive sleep apnea (adult) (pediatric): G47.33

## 2023-05-29 HISTORY — DX: Diverticulosis of intestine, part unspecified, without perforation or abscess without bleeding: K57.90

## 2023-05-29 HISTORY — DX: Bilateral inguinal hernia, without obstruction or gangrene, not specified as recurrent: K40.20

## 2023-05-29 HISTORY — DX: Unspecified right bundle-branch block: I45.10

## 2023-05-29 HISTORY — DX: Hyperlipidemia, unspecified: E78.5

## 2023-05-29 HISTORY — DX: Atherosclerosis of aorta: I70.0

## 2023-05-29 HISTORY — DX: Cyst of kidney, acquired: N28.1

## 2023-05-29 HISTORY — DX: Deficiency of other specified B group vitamins: E53.8

## 2023-05-29 HISTORY — DX: Benign prostatic hyperplasia without lower urinary tract symptoms: N40.0

## 2023-05-29 HISTORY — DX: Diaphragmatic hernia without obstruction or gangrene: K44.9

## 2023-05-29 HISTORY — DX: Transient cerebral ischemic attack, unspecified: G45.9

## 2023-05-29 SURGERY — REPAIR, HERNIA, INGUINAL, BILATERAL, ROBOT-ASSISTED
Anesthesia: General | Site: Inguinal | Laterality: Bilateral

## 2023-05-29 MED ORDER — KETOROLAC TROMETHAMINE 30 MG/ML IJ SOLN
INTRAMUSCULAR | Status: DC | PRN
  Administered 2023-05-29: 15 mg via INTRAVENOUS

## 2023-05-29 MED ORDER — HYDROCODONE-ACETAMINOPHEN 5-325 MG PO TABS: 1.0000 | ORAL_TABLET | ORAL | 0 refills | Status: AC | PRN

## 2023-05-29 MED ORDER — CHLORHEXIDINE GLUCONATE 0.12 % MT SOLN
OROMUCOSAL | Status: AC
  Filled 2023-05-29: qty 15

## 2023-05-29 MED ORDER — DEXAMETHASONE SODIUM PHOSPHATE 10 MG/ML IJ SOLN
INTRAMUSCULAR | Status: AC
  Filled 2023-05-29: qty 1

## 2023-05-29 MED ORDER — FENTANYL CITRATE (PF) 100 MCG/2ML IJ SOLN
INTRAMUSCULAR | Status: AC
  Filled 2023-05-29: qty 2

## 2023-05-29 MED ORDER — SUGAMMADEX SODIUM 200 MG/2ML IV SOLN
INTRAVENOUS | Status: DC | PRN
  Administered 2023-05-29: 300 mg via INTRAVENOUS

## 2023-05-29 MED ORDER — OXYCODONE HCL 5 MG PO TABS
5.0000 mg | ORAL_TABLET | Freq: Once | ORAL | Status: AC | PRN
  Administered 2023-05-29: 5 mg via ORAL

## 2023-05-29 MED ORDER — PROPOFOL 10 MG/ML IV BOLUS
INTRAVENOUS | Status: DC | PRN
  Administered 2023-05-29: 40 mg via INTRAVENOUS
  Administered 2023-05-29: 160 mg via INTRAVENOUS

## 2023-05-29 MED ORDER — ROCURONIUM BROMIDE 100 MG/10ML IV SOLN
INTRAVENOUS | Status: DC | PRN
  Administered 2023-05-29 (×2): 10 mg via INTRAVENOUS
  Administered 2023-05-29: 50 mg via INTRAVENOUS
  Administered 2023-05-29: 20 mg via INTRAVENOUS
  Administered 2023-05-29: 10 mg via INTRAVENOUS

## 2023-05-29 MED ORDER — ONDANSETRON HCL 4 MG/2ML IJ SOLN
INTRAMUSCULAR | Status: AC
  Filled 2023-05-29: qty 2

## 2023-05-29 MED ORDER — LIDOCAINE HCL (CARDIAC) PF 100 MG/5ML IV SOSY
PREFILLED_SYRINGE | INTRAVENOUS | Status: DC | PRN
  Administered 2023-05-29: 60 mg via INTRAVENOUS

## 2023-05-29 MED ORDER — ACETAMINOPHEN 10 MG/ML IV SOLN
INTRAVENOUS | Status: DC | PRN
  Administered 2023-05-29: 1000 mg via INTRAVENOUS

## 2023-05-29 MED ORDER — FENTANYL CITRATE (PF) 100 MCG/2ML IJ SOLN
INTRAMUSCULAR | Status: DC | PRN
  Administered 2023-05-29 (×2): 50 ug via INTRAVENOUS

## 2023-05-29 MED ORDER — EPHEDRINE 5 MG/ML INJ
INTRAVENOUS | Status: AC
  Filled 2023-05-29: qty 5

## 2023-05-29 MED ORDER — FENTANYL CITRATE (PF) 100 MCG/2ML IJ SOLN
25.0000 ug | INTRAMUSCULAR | Status: DC | PRN
  Administered 2023-05-29 (×2): 25 ug via INTRAVENOUS

## 2023-05-29 MED ORDER — DROPERIDOL 2.5 MG/ML IJ SOLN: 0.6250 mg | Freq: Once | INTRAMUSCULAR | Status: DC | PRN

## 2023-05-29 MED ORDER — PROPOFOL 10 MG/ML IV BOLUS
INTRAVENOUS | Status: AC
  Filled 2023-05-29: qty 20

## 2023-05-29 MED ORDER — OXYCODONE HCL 5 MG PO TABS
ORAL_TABLET | ORAL | Status: AC
  Filled 2023-05-29: qty 1

## 2023-05-29 MED ORDER — ONDANSETRON HCL 4 MG/2ML IJ SOLN
INTRAMUSCULAR | Status: DC | PRN
  Administered 2023-05-29: 4 mg via INTRAVENOUS

## 2023-05-29 MED ORDER — ACETAMINOPHEN 10 MG/ML IV SOLN
INTRAVENOUS | Status: AC
  Filled 2023-05-29: qty 100

## 2023-05-29 MED ORDER — EPINEPHRINE PF 1 MG/ML IJ SOLN
INTRAMUSCULAR | Status: AC
  Filled 2023-05-29: qty 1

## 2023-05-29 MED ORDER — CEFAZOLIN SODIUM-DEXTROSE 2-4 GM/100ML-% IV SOLN
INTRAVENOUS | Status: AC
  Filled 2023-05-29: qty 100

## 2023-05-29 MED ORDER — KETOROLAC TROMETHAMINE 30 MG/ML IJ SOLN
INTRAMUSCULAR | Status: AC
  Filled 2023-05-29: qty 1

## 2023-05-29 MED ORDER — BUPIVACAINE-EPINEPHRINE 0.25% -1:200000 IJ SOLN
INTRAMUSCULAR | Status: DC | PRN
  Administered 2023-05-29: 30 mL

## 2023-05-29 MED ORDER — 0.9 % SODIUM CHLORIDE (POUR BTL) OPTIME
TOPICAL | Status: DC | PRN
  Administered 2023-05-29: 500 mL

## 2023-05-29 MED ORDER — ROCURONIUM BROMIDE 10 MG/ML (PF) SYRINGE
PREFILLED_SYRINGE | INTRAVENOUS | Status: AC
  Filled 2023-05-29: qty 10

## 2023-05-29 MED ORDER — LIDOCAINE HCL (PF) 2 % IJ SOLN
INTRAMUSCULAR | Status: AC
  Filled 2023-05-29: qty 5

## 2023-05-29 MED ORDER — BUPIVACAINE HCL (PF) 0.25 % IJ SOLN
INTRAMUSCULAR | Status: AC
  Filled 2023-05-29: qty 30

## 2023-05-29 MED ORDER — EPHEDRINE SULFATE (PRESSORS) 50 MG/ML IJ SOLN
INTRAMUSCULAR | Status: DC | PRN
  Administered 2023-05-29: 15 mg via INTRAVENOUS

## 2023-05-29 SURGICAL SUPPLY — 51 items
ADH SKN CLS APL DERMABOND .7 (GAUZE/BANDAGES/DRESSINGS) ×1
BAG PRESSURE INF REUSE 1000 (BAG) IMPLANT
BLADE SURG SZ11 CARB STEEL (BLADE) ×1 IMPLANT
COVER TIP SHEARS 8 DVNC (MISCELLANEOUS) ×1 IMPLANT
COVER WAND RF STERILE (DRAPES) ×1 IMPLANT
DERMABOND ADVANCED .7 DNX12 (GAUZE/BANDAGES/DRESSINGS) ×1 IMPLANT
DRAPE ARM DVNC X/XI (DISPOSABLE) ×3 IMPLANT
DRAPE COLUMN DVNC XI (DISPOSABLE) ×1 IMPLANT
ELECT REM PT RETURN 9FT ADLT (ELECTROSURGICAL) ×1
ELECTRODE REM PT RTRN 9FT ADLT (ELECTROSURGICAL) ×1 IMPLANT
FORCEPS BPLR R/ABLATION 8 DVNC (INSTRUMENTS) ×1 IMPLANT
GLOVE BIO SURGEON STRL SZ 6.5 (GLOVE) ×2 IMPLANT
GLOVE BIOGEL PI IND STRL 6.5 (GLOVE) ×2 IMPLANT
GOWN STRL REUS W/ TWL LRG LVL3 (GOWN DISPOSABLE) ×3 IMPLANT
GOWN STRL REUS W/TWL LRG LVL3 (GOWN DISPOSABLE) ×3
IRRIGATOR SUCT 8 DISP DVNC XI (IRRIGATION / IRRIGATOR) IMPLANT
IV CATH ANGIO 12GX3 LT BLUE (NEEDLE) IMPLANT
IV NS 1000ML (IV SOLUTION)
IV NS 1000ML BAXH (IV SOLUTION) IMPLANT
KIT PINK PAD W/HEAD ARE REST (MISCELLANEOUS) ×1
KIT PINK PAD W/HEAD ARM REST (MISCELLANEOUS) ×1 IMPLANT
LABEL OR SOLS (LABEL) IMPLANT
MANIFOLD NEPTUNE II (INSTRUMENTS) ×1 IMPLANT
MESH 3DMAX MID 5X7 LT XLRG (Mesh General) IMPLANT
MESH 3DMAX MID 5X7 RT XLRG (Mesh General) IMPLANT
NDL DRIVE SUT CUT DVNC (INSTRUMENTS) ×1 IMPLANT
NDL HYPO 22X1.5 SAFETY MO (MISCELLANEOUS) ×1 IMPLANT
NDL INSUFFLATION 14GA 120MM (NEEDLE) ×1 IMPLANT
NEEDLE DRIVE SUT CUT DVNC (INSTRUMENTS) ×1 IMPLANT
NEEDLE HYPO 22X1.5 SAFETY MO (MISCELLANEOUS) ×1 IMPLANT
NEEDLE INSUFFLATION 14GA 120MM (NEEDLE) ×1 IMPLANT
OBTURATOR OPTICAL STND 8 DVNC (TROCAR) ×1
OBTURATOR OPTICALSTD 8 DVNC (TROCAR) ×1 IMPLANT
PACK LAP CHOLECYSTECTOMY (MISCELLANEOUS) ×1 IMPLANT
SCISSORS MNPLR CVD DVNC XI (INSTRUMENTS) ×1 IMPLANT
SEAL UNIV 5-12 XI (MISCELLANEOUS) ×3 IMPLANT
SET TUBE SMOKE EVAC HIGH FLOW (TUBING) ×1 IMPLANT
SOL ELECTROSURG ANTI STICK (MISCELLANEOUS) ×1
SOLUTION ELECTROSURG ANTI STCK (MISCELLANEOUS) ×1 IMPLANT
SUT MNCRL 4-0 (SUTURE) ×1
SUT MNCRL 4-0 27XMFL (SUTURE) ×1
SUT PROLENE 2 0 FS (SUTURE) IMPLANT
SUT VIC AB 2-0 SH 27 (SUTURE) ×2
SUT VIC AB 2-0 SH 27XBRD (SUTURE) ×1 IMPLANT
SUT VICRYL 0 AB UR-6 (SUTURE) IMPLANT
SUT VLOC 90 S/L VL9 GS22 (SUTURE) ×1 IMPLANT
SUTURE MNCRL 4-0 27XMF (SUTURE) ×1 IMPLANT
TAPE TRANSPORE STRL 2 31045 (GAUZE/BANDAGES/DRESSINGS) IMPLANT
TRAP FLUID SMOKE EVACUATOR (MISCELLANEOUS) ×1 IMPLANT
TRAY FOLEY MTR SLVR 16FR STAT (SET/KITS/TRAYS/PACK) ×1 IMPLANT
WATER STERILE IRR 500ML POUR (IV SOLUTION) ×1 IMPLANT

## 2023-05-29 NOTE — Anesthesia Preprocedure Evaluation (Signed)
Anesthesia Evaluation  Patient identified by MRN, date of birth, ID band Patient awake    Reviewed: Allergy & Precautions, NPO status , Patient's Chart, lab work & pertinent test results  History of Anesthesia Complications Negative for: history of anesthetic complications  Airway Mallampati: II  TM Distance: >3 FB Neck ROM: Full  Mouth opening: Limited Mouth Opening  Dental  (+) Caps, Dental Advidsory Given, Missing, Teeth Intact   Pulmonary neg shortness of breath, sleep apnea and Continuous Positive Airway Pressure Ventilation , neg COPD, neg recent URI   Pulmonary exam normal        Cardiovascular hypertension, Pt. on medications (-) angina (-) Past MI and (-) Cardiac Stents Normal cardiovascular exam+ dysrhythmias (RBBB) (-) Valvular Problems/Murmurs     Neuro/Psych neg Seizures Parkinsonism TIA negative psych ROS   GI/Hepatic Neg liver ROS, hiatal hernia,GERD  Medicated,,  Endo/Other  neg diabetesHypothyroidism    Renal/GU Renal disease (kidney stone)  negative genitourinary   Musculoskeletal negative musculoskeletal ROS (+)    Abdominal   Peds negative pediatric ROS (+)  Hematology negative hematology ROS (+)   Anesthesia Other Findings . B-cell lymphoma (CMS-HCC)  . Essential hypertension  . Crohn's disease of colon without complication (CMS-HCC)  . Bundle branch block, right  . 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 OSA on CPAP  . RBD (REM behavioral disorder)  . History of 2019 novel coronavirus disease (COVID-19)  . Medicare annual wellness visit, initial  . Follicular lymphoma (CMS-HCC)  . Thoracic aortic aneurysm without rupture (CMS-HCC)  . Hyperlipidemia, mixed  . Aortic atherosclerosis (CMS-HCC)  . Primary Parkinson's disease (CMS-HCC)     Reproductive/Obstetrics negative OB ROS                             Anesthesia Physical Anesthesia Plan  ASA:  3  Anesthesia Plan: General   Post-op Pain Management:    Induction: Intravenous  PONV Risk Score and Plan: 2 and Ondansetron, Dexamethasone and Treatment may vary due to age or medical condition  Airway Management Planned: Oral ETT  Additional Equipment:   Intra-op Plan:   Post-operative Plan: Extubation in OR  Informed Consent: I have reviewed the patients History and Physical, chart, labs and discussed the procedure including the risks, benefits and alternatives for the proposed anesthesia with the patient or authorized representative who has indicated his/her understanding and acceptance.       Plan Discussed with: CRNA, Anesthesiologist and Surgeon  Anesthesia Plan Comments:         Anesthesia Quick Evaluation

## 2023-05-29 NOTE — Transfer of Care (Signed)
Immediate Anesthesia Transfer of Care Note  Patient: Troy Reynolds  Procedure(s) Performed: XI ROBOTIC ASSISTED BILATERAL INGUINAL HERNIA (Bilateral: Inguinal)  Patient Location: PACU  Anesthesia Type:General  Level of Consciousness: drowsy, patient cooperative, and responds to stimulation  Airway & Oxygen Therapy: Patient Spontanous Breathing and Patient connected to face mask oxygen  Post-op Assessment: Report given to RN and Post -op Vital signs reviewed and stable  Post vital signs: Reviewed and stable  Last Vitals:  Vitals Value Taken Time  BP 142/81 05/29/23 1022  Temp 36.3 C 05/29/23 1022  Pulse 83 05/29/23 1024  Resp 14 05/29/23 1024  SpO2 100 % 05/29/23 1024  Vitals shown include unvalidated device data.  Last Pain:  Vitals:   05/29/23 0637  TempSrc: Temporal  PainSc: 0-No pain         Complications: No notable events documented.

## 2023-05-29 NOTE — Interval H&P Note (Signed)
History and Physical Interval Note:  05/29/2023 6:30 AM  Troy Reynolds  has presented today for surgery, with the diagnosis of K40.90 non recurrent unilateral inguinal hernia w/o obstruction or gangrene.  The various methods of treatment have been discussed with the patient and family. After consideration of risks, benefits and other options for treatment, the patient has consented to  Procedure(s): XI ROBOTIC ASSISTED BILATERAL INGUINAL HERNIA (Bilateral) as a surgical intervention.  The patient's history has been reviewed, patient examined, no change in status, stable for surgery.  I have reviewed the patient's chart and labs.  Questions were answered to the patient's satisfaction.     Carolan Shiver

## 2023-05-29 NOTE — Anesthesia Postprocedure Evaluation (Signed)
Anesthesia Post Note  Patient: Troy Reynolds  Procedure(s) Performed: XI ROBOTIC ASSISTED BILATERAL INGUINAL HERNIA (Bilateral: Inguinal)  Patient location during evaluation: PACU Anesthesia Type: General Level of consciousness: awake and alert Pain management: pain level controlled Vital Signs Assessment: post-procedure vital signs reviewed and stable Respiratory status: spontaneous breathing, nonlabored ventilation, respiratory function stable and patient connected to nasal cannula oxygen Cardiovascular status: blood pressure returned to baseline and stable Postop Assessment: no apparent nausea or vomiting Anesthetic complications: no   No notable events documented.   Last Vitals:  Vitals:   05/29/23 1119 05/29/23 1226  BP: (!) 160/89 (!) 118/95  Pulse: 84 88  Resp: 18 18  Temp: (!) 36.1 C (!) 36.2 C  SpO2: 96% 99%    Last Pain:  Vitals:   05/29/23 1119  TempSrc: Temporal  PainSc: 0-No pain                 Lenard Simmer

## 2023-05-29 NOTE — Op Note (Signed)
Preoperative diagnosis: Bilateral inguinal hernia.   Postoperative diagnosis: Bilateral inguinal hernia.  Procedure: Robotic assisted Laparoscopic Transabdominal preperitoneal laparoscopic (TAPP) repair of bilateral inguinal hernia.  Anesthesia: GETA  Surgeon: Dr. Hazle Quant  Wound Classification: Clean  Indications:  Patient is a 72 y.o. male developed a symptomatic bilateral inguinal hernia. Repair was indicated.  Findings: 1. Bilateral indirect Inguinal hernia identified 2. Vas deferens and cord structures identified and preserved 3. Bard Extra Large 3D Max MID Anatomical mesh used for repair 4. Adequate hemostasis.              Description of procedure:  The patient was taken to the operating room and the correct side of surgery was verified. The patient was placed supine with right arm tucked at the side. After obtaining adequate anesthesia, the patient's abdomen was prepped and draped in standard sterile fashion. A time-out was completed verifying correct patient, procedure, site, positioning, and implant(s) and/or special equipment prior to beginning this procedure.  An incision was made in a natural skin line in the left upper quadrant. Using Hasson technique the fascia was elevated and abdominal cavity entered and a 12 mm trocar inserted.  The abdomen was insufflated with carbon dioxide to a pressure of 15 mmHg. The patient tolerated insufflation well.   No injury was identified.  Two other 8 mm trocars were placed in the upper abdomen.  Scissors and bipolar forceps were inserted under direct visualization. Right and left sided inguinal hernia repaired using the same technique. At the robotic console: Transverse peritoneal incision is made about 8 cm superior to the inguinal defect. Medial to the epigastric vessels, the parietal compartment is dissected to visualize the rectus muscle. This is carried down to the symphysis pubis and the retropubic space is dissected to  expose at least 2 cm contralateral to the midline. Cooper's ligament is exposed and cleared at least 2 cm below the ligament to ensure adequate space for the inferior border of the mesh. Hesselbach's triangle is cleared assessing for a direct hernia. Lateral to the epigastric vessels, the dissection is carried out in visceral compartment continuing in the true preperitoneal plane. Indirect hernia sac, was carefully reduced and separated from the cord structures with medial retraction and a combination of blunt/sharp dissection and focused cautery. This dissection was continued until the cord structures are "parietalized" completely, allowing for visualization of the reflected peritoneum that is continuous with the line originating 2 cm below Coopers medially and across the psoas muscle in the lateral compartment.  The internal ring was interrogated for a cord lipoma. The cord lipoma was reduced to the retroperitoneum and seated dorsal to the preperitoneal mesh. Having achieved a complete dissection with a critical view of the entire myopectineal orifice of both sides, an XL mesh was then positioned centered at the iliopubic tract with the medial side crossing the midline and the inferior edge positioned 2 cm below Coopers ligament. The lateral aspect of the mesh extended 3-5 cm beyond the lateral edge of the psoas. This was done on both sides. The meshes were fixated using an interrupted suture placed to the ipsilateral Coopers ligament. A second suture was done at the medial superior aspect of the mesh fixating this to the rectus complex.  The peritoneal flap is closed with running barbed suture. Additional holes in the peritoneum closed with suture. Preperitoneal space gas aspirated to visualize the peritoneum apposed directly against the mesh and ensure no folding, lifting, or buckling of the mesh. Skin is closed, sterile  dressings are applied.  The patient tolerated the procedure well and was taken to the  postanesthesia care unit in stable condition  Specimen: None  Complications: None  Estimated Blood Loss: 10 mL

## 2023-05-29 NOTE — Discharge Instructions (Addendum)
  Diet: Resume home heart healthy regular diet.   Activity: No heavy lifting >20 pounds (children, pets, laundry, garbage) or strenuous activity until follow-up, but light activity and walking are encouraged. Do not drive or drink alcohol if taking narcotic pain medications.  Wound care: May shower with soapy water and pat dry (do not rub incisions), but no baths or submerging incision underwater until follow-up. (no swimming)   Medications: Resume all home medications. For mild to moderate pain: acetaminophen (Tylenol) ***or ibuprofen (if no kidney disease). Combining Tylenol with alcohol can substantially increase your risk of causing liver disease. Narcotic pain medications, if prescribed, can be used for severe pain, though may cause nausea, constipation, and drowsiness. Do not combine Tylenol and Norco within a 6 hour period as Norco contains Tylenol. If you do not need the narcotic pain medication, you do not need to fill the prescription.  Call office ([phone]) at any time if any questions, worsening pain, fevers/chills, bleeding, drainage from incision site, or other concerns.   AMBULATORY SURGERY  DISCHARGE INSTRUCTIONS   The drugs that you were given will stay in your system until tomorrow so for the next 24 hours you should not:  Drive an automobile Make any legal decisions Drink any alcoholic beverage   You may resume regular meals tomorrow.  Today it is better to start with liquids and gradually work up to solid foods.  You may eat anything you prefer, but it is better to start with liquids, then soup and crackers, and gradually work up to solid foods.   Please notify your doctor immediately if you have any unusual bleeding, trouble breathing, redness and pain at the surgery site, drainage, fever, or pain not relieved by medication.    Additional Instructions:        Please contact your physician with any problems or Same Day Surgery at [phone], Monday  through Friday 6 am to 4 pm, or [place] [facility] [place] [facility] number at [phone].  

## 2023-05-29 NOTE — Anesthesia Procedure Notes (Signed)
Procedure Name: Intubation Date/Time: 05/29/2023 7:51 AM  Performed by: Jeannene Patella, CRNAPre-anesthesia Checklist: Patient identified Patient Re-evaluated:Patient Re-evaluated prior to induction Oxygen Delivery Method: Circle system utilized Preoxygenation: Pre-oxygenation with 100% oxygen Induction Type: IV induction Ventilation: Mask ventilation with difficulty and Oral airway inserted - appropriate to patient size Laryngoscope Size: McGraph and 4 Grade View: Grade III Tube type: Oral Tube size: 7.5 mm Number of attempts: 1 Airway Equipment and Method: Stylet, Video-laryngoscopy and LTA kit utilized Placement Confirmation: ETT inserted through vocal cords under direct vision, positive ETCO2 and breath sounds checked- equal and bilateral Secured at: 23 (at lip) cm Tube secured with: Tape Dental Injury: Teeth and Oropharynx as per pre-operative assessment  Difficulty Due To: Difficult Airway- due to anterior larynx Comments: Firm cricoid pressure to achieve grade 3 view larynx is anterior, short chin 2 FM SM or less

## 2023-07-17 ENCOUNTER — Ambulatory Visit
Admission: RE | Admit: 2023-07-17 | Discharge: 2023-07-17 | Disposition: A | Discharge status: Home / Self Care | Payer: Medicare HMO | Source: Ambulatory Visit | Attending: Internal Medicine | Admitting: Internal Medicine

## 2023-07-17 DIAGNOSIS — C8203 Follicular lymphoma grade I, intra-abdominal lymph nodes: Secondary | ICD-10-CM | POA: Diagnosis present

## 2023-07-17 MED ORDER — IOHEXOL 300 MG/ML  SOLN
100.0000 mL | Freq: Once | INTRAMUSCULAR | Status: AC | PRN
  Administered 2023-07-17: 100 mL via INTRAVENOUS

## 2023-07-17 MED ORDER — BARIUM SULFATE 2 % PO SUSP
450.0000 mL | ORAL | Status: AC
  Administered 2023-07-17 (×2): 450 mL via ORAL

## 2023-07-24 ENCOUNTER — Encounter: Payer: Self-pay | Admitting: Internal Medicine

## 2023-07-24 ENCOUNTER — Inpatient Hospital Stay: Payer: Medicare HMO | Admitting: Internal Medicine

## 2023-07-24 ENCOUNTER — Inpatient Hospital Stay: Payer: Medicare HMO | Attending: Internal Medicine

## 2023-07-24 VITALS — Ht 72.0 in

## 2023-07-24 DIAGNOSIS — N400 Enlarged prostate without lower urinary tract symptoms: Secondary | ICD-10-CM | POA: Diagnosis not present

## 2023-07-24 DIAGNOSIS — E538 Deficiency of other specified B group vitamins: Secondary | ICD-10-CM | POA: Diagnosis not present

## 2023-07-24 DIAGNOSIS — G20A1 Parkinson's disease without dyskinesia, without mention of fluctuations: Secondary | ICD-10-CM | POA: Insufficient documentation

## 2023-07-24 DIAGNOSIS — K449 Diaphragmatic hernia without obstruction or gangrene: Secondary | ICD-10-CM | POA: Insufficient documentation

## 2023-07-24 DIAGNOSIS — Z8673 Personal history of transient ischemic attack (TIA), and cerebral infarction without residual deficits: Secondary | ICD-10-CM | POA: Diagnosis not present

## 2023-07-24 DIAGNOSIS — Z809 Family history of malignant neoplasm, unspecified: Secondary | ICD-10-CM | POA: Insufficient documentation

## 2023-07-24 DIAGNOSIS — Z8572 Personal history of non-Hodgkin lymphomas: Secondary | ICD-10-CM | POA: Diagnosis present

## 2023-07-24 DIAGNOSIS — C8203 Follicular lymphoma grade I, intra-abdominal lymph nodes: Secondary | ICD-10-CM

## 2023-07-24 DIAGNOSIS — I700 Atherosclerosis of aorta: Secondary | ICD-10-CM | POA: Insufficient documentation

## 2023-07-24 DIAGNOSIS — K219 Gastro-esophageal reflux disease without esophagitis: Secondary | ICD-10-CM | POA: Diagnosis not present

## 2023-07-24 DIAGNOSIS — Z808 Family history of malignant neoplasm of other organs or systems: Secondary | ICD-10-CM | POA: Insufficient documentation

## 2023-07-24 DIAGNOSIS — I712 Thoracic aortic aneurysm, without rupture, unspecified: Secondary | ICD-10-CM | POA: Diagnosis not present

## 2023-07-24 LAB — CBC WITH DIFFERENTIAL (CANCER CENTER ONLY)
Abs Immature Granulocytes: 0.06 10*3/uL (ref 0.00–0.07)
Basophils Absolute: 0.1 10*3/uL (ref 0.0–0.1)
Basophils Relative: 1 %
Eosinophils Absolute: 0.1 10*3/uL (ref 0.0–0.5)
Eosinophils Relative: 2 %
HCT: 48.3 % (ref 39.0–52.0)
Hemoglobin: 16 g/dL (ref 13.0–17.0)
Immature Granulocytes: 1 %
Lymphocytes Relative: 17 %
Lymphs Abs: 0.8 10*3/uL (ref 0.7–4.0)
MCH: 28.7 pg (ref 26.0–34.0)
MCHC: 33.1 g/dL (ref 30.0–36.0)
MCV: 86.7 fL (ref 80.0–100.0)
Monocytes Absolute: 0.5 10*3/uL (ref 0.1–1.0)
Monocytes Relative: 11 %
Neutro Abs: 3.4 10*3/uL (ref 1.7–7.7)
Neutrophils Relative %: 68 %
Platelet Count: 215 10*3/uL (ref 150–400)
RBC: 5.57 MIL/uL (ref 4.22–5.81)
RDW: 13.6 % (ref 11.5–15.5)
WBC Count: 5 10*3/uL (ref 4.0–10.5)
nRBC: 0 % (ref 0.0–0.2)

## 2023-07-24 LAB — CMP (CANCER CENTER ONLY)
ALT: 25 U/L (ref 0–44)
AST: 22 U/L (ref 15–41)
Albumin: 3.9 g/dL (ref 3.5–5.0)
Alkaline Phosphatase: 70 U/L (ref 38–126)
Anion gap: 6 (ref 5–15)
BUN: 20 mg/dL (ref 8–23)
CO2: 26 mmol/L (ref 22–32)
Calcium: 8.7 mg/dL — ABNORMAL LOW (ref 8.9–10.3)
Chloride: 105 mmol/L (ref 98–111)
Creatinine: 0.98 mg/dL (ref 0.61–1.24)
GFR, Estimated: >60 mL/min (ref >60)
Glucose, Bld: 125 mg/dL — ABNORMAL HIGH (ref 70–99)
Potassium: 3.6 mmol/L (ref 3.5–5.1)
Sodium: 137 mmol/L (ref 135–145)
Total Bilirubin: 0.6 mg/dL (ref 0.3–1.2)
Total Protein: 6.5 g/dL (ref 6.5–8.1)

## 2023-07-24 LAB — LACTATE DEHYDROGENASE: LDH: 129 U/L (ref 98–192)

## 2023-07-24 NOTE — Progress Notes (Signed)
Surveillance B cell lymphoma; Had inguinal hernia repair on 05/29/23. Still has a hard knot in area; Surgeon might aspirate it. Appetite is okay, eats less then he used to eat. Denies any fevers, night sweats, swollen lymph . Denies fatigue. Started back on exercises slowly since surgery. Pt has a sense of nausea intermittently. Constipation relieved by stool softeners and miralax.  Pt states he has a runny nose for several months. Tried OTC allergy meds but it wasn't really beneficial.

## 2023-07-24 NOTE — Assessment & Plan Note (Addendum)
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  Currently on surveillance; AUG 2023-enlarged but stable eccentric 3.3 x 2 cm cm in right lower quadrant.   Jul 17, 2023-  Slight interval increase in size of the mesenteric and retroperitoneal lymph nodes. No splenomegaly or thoracic lymphadenopathy.   # discussed re: increasing size of lymphoma; recommend PET scan in 6 months. If worsening recommend rituximab. #  Discussed mechanism of action of rituximab;and also potential side effects including but not limited to infusion reactions;rare infections/activation including hepatitis.  Check hepatitis work-up at baseline.    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 Nausea- ? Etiology- denies anti-emetics.   # Parkinson- mild- under surveillance [Dr.Shah]- stable  # B12 def [PCP]- on 1000 mg/day PO- stable   # Thoracic AA-4.2 cm- stable [ CT as above]- F/U with Dr.Callwood. stable  # Prostate enlargement- [on Flomax]- recommend evaluation with urology. Defer to PCP.   #Incidental findings on Imaging  CT , 2024: 3. Moderate-sized hiatal hernia; Aortic Atherosclerosis I reviewed/discussed/counseled the patient.   my chart appt # DISPOSITION: # follow up in 6 months; MD- labs- CBC/MP/LDH;hepatitis pane; PET scan prior-Dr.B  # I reviewed the blood work- with the patient in detail; also reviewed the imaging independently [as summarized above]; and with the patient in detail.

## 2023-07-24 NOTE — Progress Notes (Signed)
[facility] OFFICE PROGRESS NOTE  Patient Care Team: Danella Penton, MD as PCP - General (Internal Medicine) Gladis Riffle, MD as Consulting Physician (Surgery) Stanton Kidney, MD as Consulting Physician (Gastroenterology) Earna Coder, MD as Consulting Physician (Internal Medicine)   Cancer Staging  No matching staging information was found for the patient.    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 Recm surviellance; Dr.Crystal.  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 Alone.   Troy Reynolds 72 y.o.  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  Patient had inguinal hernia repair on 05/29/23. Still has a hard knot in area; Surgeon might aspirate it.   Appetite is okay, eats less then he used to eat. Denies  any fevers, night sweats, swollen lymph.  Denies fatigue. Started back on exercises slowly since surgery. Pt has a sense of nausea intermittently. Constipation relieved by stool softeners and miralax.   Pt states he has a runny nose for several months.   Denies abdominal pain. Complains of mild nausea; but no vomiting or lumps or bumps.  Review of Systems  Constitutional:  Negative for chills, diaphoresis, fever and weight loss.  HENT:  Negative for nosebleeds and sore throat.   Eyes:  Negative for double vision.  Respiratory:  Negative for cough, hemoptysis, sputum production, shortness of breath and wheezing.   Cardiovascular:  Negative for chest pain, palpitations, orthopnea and leg swelling.  Gastrointestinal:  Positive for constipation. Negative for abdominal pain, blood in stool, diarrhea, heartburn, melena and vomiting.       Abdominal discomfort/bloating.  Genitourinary:  Negative for dysuria, frequency and urgency.  Musculoskeletal:  Negative for back pain and joint pain.  Skin: Negative.  Negative for itching and rash.  Neurological:  Negative for dizziness, tingling, focal weakness, weakness and headaches.  Endo/Heme/Allergies:  Does not bruise/bleed easily.  Psychiatric/Behavioral:  Negative for depression. The patient is not nervous/anxious and does not have insomnia.     PAST MEDICAL HISTORY :  Past Medical History:  Diagnosis Date   Actinic keratosis    Aortic atherosclerosis (HCC)    Arthritis    B12 deficiency    Bilateral inguinal hernia    BPH (benign prostatic hyperplasia)    Colon polyp, hyperplastic    Crohn's disease (HCC)    Diverticulosis    DLBCL (diffuse large B cell lymphoma) (HCC) 2014   Chemo tx's.   GERD (gastroesophageal reflux disease)    Hiatal hernia  HLD (hyperlipidemia)    Hypertension    Hypothyroid    Kidney stones    OSA on CPAP    Primary Parkinson's disease    RBBB (right bundle branch block)    Renal cyst, left    Skin  cancer    R jaw - treated by Dr. Orson Aloe many years ago - unsure which type   Thoracic aortic aneurysm (HCC) 07/17/2020   a.) CT AP 07/17/2020: 4.2 cm; b.) CT AP 07/17/2021: 4.1 cm; c.) CT AP 07/02/2022: 4.1 cm   TIA (transient ischemic attack)     PAST SURGICAL HISTORY :   Past Surgical History:  Procedure Laterality Date   BOWEL RESECTION N/A 06/19/2016   Procedure: SMALL BOWEL RESECTION;  Surgeon: Gladis Riffle, MD;  Location: [facility] ORS;  Service: General;  Laterality: N/A;   CHOLECYSTECTOMY     COLONOSCOPY     COLONOSCOPY WITH PROPOFOL N/A 07/19/2021   Procedure: COLONOSCOPY WITH PROPOFOL;  Surgeon: Regis Bill, MD;  Location: [facility] END[facility];  Service: Endoscopy;  Laterality: N/A;   ESOPHAGOGASTRODUODENOSCOPY     ESOPHAGOGASTRODUODENOSCOPY (EGD) WITH PROPOFOL N/A 07/19/2021   Procedure: ESOPHAGOGASTRODUODENOSCOPY (EGD) WITH PROPOFOL;  Surgeon: Regis Bill, MD;  Location: [facility] ENDOS[facility];  Service: Endoscopy;  Laterality: N/A;   LAPAROSCOPIC REMOVAL OF MESENTERIC MASS N/A 06/19/2016   Procedure: LAPAROSCOPIC REMOVAL OF MESENTERIC MASS;  Surgeon: Gladis Riffle, MD;  Location: [facility] ORS;  Service: General;  Laterality: N/A;   LYMPH NODE DISSECTION     PORT-A-CATH REMOVAL     PORTACATH PLACEMENT      FAMILY HISTORY :   Family History  Problem Relation Age of Onset   Clotting disorder Mother    Pulmonary embolism Mother    Lymphoma Father    Hiatal hernia Father    Heart disease Father        stent placed   Hypertension Father    Ulcers Father    Brain cancer Maternal Aunt    Cancer Paternal Uncle        esophagus    SOCIAL HISTORY:   Social History   Tobacco Use   Smoking status: Never   Smokeless tobacco: Never  Vaping Use   Vaping status: Never Used  Substance Use Topics   Alcohol use: No   Drug use: No    ALLERGIES:  has No Known Allergies.  MEDICATIONS:  Current Outpatient Medications  Medication Sig Dispense Refill    cyanocobalamin (VITAMIN B12) 1000 MCG tablet Take 1,000 mcg by mouth daily.     levothyroxine (SYNTHROID) 75 MCG tablet 100 mcg.     losartan-hydrochlorothiazide (HYZAAR) 50-12.5 MG per tablet TAKE 1 TABLET DAILY.     naproxen sodium (ALEVE) 220 MG tablet Take 220 mg by mouth daily as needed.     pantoprazole (PROTONIX) 40 MG tablet Take 40 mg by mouth daily.      simvastatin (ZOCOR) 20 MG tablet Take 1 tablet by mouth daily.     tamsulosin (FLOMAX) 0.4 MG CAPS capsule Take 0.4 mg by mouth daily.     No current facility-administered medications for this visit.    PHYSICAL EXAMINATION: ECOG PERFORMANCE STATUS: 1 - Symptomatic but completely ambulatory  Ht 6' (1.829 m)   BMI 27.80 kg/m   There were no vitals filed for this visit.  Physical Exam HENT:     Head: Normocephalic and atraumatic.     Mouth/Throat:     Pharynx: No oropharyngeal exudate.  Eyes:     Pupils:  Pupils are equal, round, and reactive to light.  Cardiovascular:     Rate and Rhythm: Normal rate and regular rhythm.  Pulmonary:     Effort: No respiratory distress.     Breath sounds: No wheezing.  Abdominal:     General: Bowel sounds are normal. There is no distension.     Palpations: Abdomen is soft. There is no mass.     Tenderness: There is no abdominal tenderness. There is no guarding or rebound.     Comments: Midabdominal incision noted.  Well-healed.  Musculoskeletal:        General: No tenderness. Normal range of motion.     Cervical back: Normal range of motion and neck supple.  Skin:    General: Skin is warm.  Neurological:     Mental Status: He is alert and oriented to person, place, and time.  Psychiatric:        Mood and Affect: Affect normal.       LABORATORY DATA:  I have reviewed the data as listed    Component Value Date/Time   NA 137 07/24/2023 1038   NA 140 03/12/2015 1007   K 3.6 07/24/2023 1038   K 3.7 03/12/2015 1007   CL 105 07/24/2023 1038   CL 105 03/12/2015 1007   CO2 26  07/24/2023 1038   CO2 29 03/12/2015 1007   GLUCOSE 125 (H) 07/24/2023 1038   GLUCOSE 143 (H) 03/12/2015 1007   BUN 20 07/24/2023 1038   BUN 15 03/12/2015 1007   CREATININE 0.98 07/24/2023 1038   CREATININE 0.92 03/12/2015 1007   CALCIUM 8.7 (L) 07/24/2023 1038   CALCIUM 8.6 (L) 03/12/2015 1007   PROT 6.5 07/24/2023 1038   PROT 6.6 03/12/2015 1007   ALBUMIN 3.9 07/24/2023 1038   ALBUMIN 4.0 03/12/2015 1007   AST 22 07/24/2023 1038   ALT 25 07/24/2023 1038   ALT 37 03/12/2015 1007   ALKPHOS 70 07/24/2023 1038   ALKPHOS 79 03/12/2015 1007   BILITOT 0.6 07/24/2023 1038   GFRNONAA >60 07/24/2023 1038   GFRNONAA >60 03/12/2015 1007   GFRAA >60 07/19/2020 1011   GFRAA >60 03/12/2015 1007    No results found for: "SPEP", "UPEP"  Lab Results  Component Value Date   WBC 5.0 07/24/2023   NEUTROABS 3.4 07/24/2023   HGB 16.0 07/24/2023   HCT 48.3 07/24/2023   MCV 86.7 07/24/2023   PLT 215 07/24/2023      Chemistry      Component Value Date/Time   NA 137 07/24/2023 1038   NA 140 03/12/2015 1007   K 3.6 07/24/2023 1038   K 3.7 03/12/2015 1007   CL 105 07/24/2023 1038   CL 105 03/12/2015 1007   CO2 26 07/24/2023 1038   CO2 29 03/12/2015 1007   BUN 20 07/24/2023 1038   BUN 15 03/12/2015 1007   CREATININE 0.98 07/24/2023 1038   CREATININE 0.92 03/12/2015 1007      Component Value Date/Time   CALCIUM 8.7 (L) 07/24/2023 1038   CALCIUM 8.6 (L) 03/12/2015 1007   ALKPHOS 70 07/24/2023 1038   ALKPHOS 79 03/12/2015 1007   AST 22 07/24/2023 1038   ALT 25 07/24/2023 1038   ALT 37 03/12/2015 1007   BILITOT 0.6 07/24/2023 1038       RADIOGRAPHIC STUDIES: I have personally reviewed the radiological images as listed and agreed with the findings in the report. No results found.   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  Currently on surveillance; AUG 2023-enlarged but  stable eccentric 3.3 x 2 cm cm in right lower quadrant.   Jul 17, 2023-  Slight interval increase in size of the mesenteric and retroperitoneal lymph nodes. No splenomegaly or thoracic lymphadenopathy.   # discussed re: increasing size of lymphoma; recommend PET scan in 6 months. If worsening recommend rituximab. #  Discussed mechanism of action of rituximab;and also potential side effects including but not limited to infusion reactions;rare infections/activation including hepatitis.  Check hepatitis work-up at baseline.    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 Nausea- ? Etiology- denies anti-emetics.   # Parkinson- mild- under surveillance [Dr.Shah]- stable  # B12 def [PCP]- on 1000 mg/day PO- stable   # Thoracic AA-4.2 cm- stable [ CT as above]- F/U with Dr.Callwood. stable  # Prostate enlargement- [on Flomax]- recommend evaluation with urology. Defer to PCP.   #Incidental findings on Imaging  CT , 2024: 3. Moderate-sized hiatal hernia; Aortic Atherosclerosis I reviewed/discussed/counseled the patient.   my chart appt # DISPOSITION: # follow up in 6 months; MD- labs- CBC/MP/LDH;hepatitis pane; PET scan prior-Dr.B  # I reviewed the blood work- with the patient in detail; also reviewed the imaging independently [as summarized above]; and with the patient in detail.     Orders Placed This Encounter  Procedures   NM PET Image Restage (PS) Skull Base to Thigh (F-18 FDG)    Standing Status:   Future    Standing Expiration Date:   07/23/2024    Order Specific Question:   If indicated for the ordered procedure, I authorize the administration of a radiopharmaceutical per Radiology protocol    Answer:   Yes    Order Specific Question:   Preferred imaging location?    Answer:   [facility]   CBC with Differential (Cancer Center Only)    Standing Status:   Future    Standing Expiration Date:   07/23/2024   CMP  (Cancer Center only)    Standing Status:   Future    Standing Expiration Date:   07/23/2024   Lactate dehydrogenase    Standing Status:   Future    Standing Expiration Date:   07/23/2024   Hepatitis panel, acute    Standing Status:   Future    Standing Expiration Date:   07/23/2024   Hepatitis C antibody    Standing Status:   Future    Standing Expiration Date:   07/23/2024   Hepatitis B surface antibody    Standing Status:   Future    Standing Expiration Date:   07/23/2024   Hepatitis B core antibody, IgM    Standing Status:   Future    Standing Expiration Date:   07/23/2024   All questions were answered. The patient knows to call the clinic with any problems, questions or concerns.      Earna Coder, MD 07/24/2023 11:53 AM

## 2024-01-20 ENCOUNTER — Ambulatory Visit
Admission: RE | Admit: 2024-01-20 | Discharge: 2024-01-20 | Disposition: A | Discharge status: Home / Self Care | Payer: Medicare HMO | Source: Ambulatory Visit | Attending: Internal Medicine | Admitting: Internal Medicine

## 2024-01-20 DIAGNOSIS — M4317 Spondylolisthesis, lumbosacral region: Secondary | ICD-10-CM | POA: Diagnosis not present

## 2024-01-20 DIAGNOSIS — I700 Atherosclerosis of aorta: Secondary | ICD-10-CM | POA: Diagnosis not present

## 2024-01-20 DIAGNOSIS — C8203 Follicular lymphoma grade I, intra-abdominal lymph nodes: Secondary | ICD-10-CM | POA: Insufficient documentation

## 2024-01-20 DIAGNOSIS — R590 Localized enlarged lymph nodes: Secondary | ICD-10-CM | POA: Diagnosis not present

## 2024-01-20 DIAGNOSIS — N400 Enlarged prostate without lower urinary tract symptoms: Secondary | ICD-10-CM | POA: Diagnosis not present

## 2024-01-20 DIAGNOSIS — K449 Diaphragmatic hernia without obstruction or gangrene: Secondary | ICD-10-CM | POA: Insufficient documentation

## 2024-01-20 DIAGNOSIS — I7121 Aneurysm of the ascending aorta, without rupture: Secondary | ICD-10-CM | POA: Diagnosis not present

## 2024-01-20 LAB — GLUCOSE, CAPILLARY: Glucose-Capillary: 96 mg/dL (ref 70–99)

## 2024-01-20 MED ORDER — FLUDEOXYGLUCOSE F - 18 (FDG) INJECTION
10.6000 | Freq: Once | INTRAVENOUS | Status: AC | PRN
  Administered 2024-01-20: 11.36 via INTRAVENOUS

## 2024-01-29 ENCOUNTER — Inpatient Hospital Stay: Payer: Medicare HMO | Attending: Internal Medicine

## 2024-01-29 ENCOUNTER — Inpatient Hospital Stay: Payer: Medicare HMO | Admitting: Internal Medicine

## 2024-01-29 ENCOUNTER — Encounter: Payer: Self-pay | Admitting: Internal Medicine

## 2024-01-29 VITALS — BP 125/79 | HR 80 | Temp 97.1°F | Resp 18

## 2024-01-29 DIAGNOSIS — Z79620 Long term (current) use of immunosuppressive biologic: Secondary | ICD-10-CM | POA: Insufficient documentation

## 2024-01-29 DIAGNOSIS — Z923 Personal history of irradiation: Secondary | ICD-10-CM | POA: Insufficient documentation

## 2024-01-29 DIAGNOSIS — C8203 Follicular lymphoma grade I, intra-abdominal lymph nodes: Secondary | ICD-10-CM | POA: Diagnosis not present

## 2024-01-29 DIAGNOSIS — K5900 Constipation, unspecified: Secondary | ICD-10-CM | POA: Insufficient documentation

## 2024-01-29 DIAGNOSIS — Z9221 Personal history of antineoplastic chemotherapy: Secondary | ICD-10-CM | POA: Insufficient documentation

## 2024-01-29 DIAGNOSIS — Z79899 Other long term (current) drug therapy: Secondary | ICD-10-CM | POA: Insufficient documentation

## 2024-01-29 DIAGNOSIS — N400 Enlarged prostate without lower urinary tract symptoms: Secondary | ICD-10-CM | POA: Diagnosis not present

## 2024-01-29 DIAGNOSIS — C8333 Diffuse large B-cell lymphoma, intra-abdominal lymph nodes: Secondary | ICD-10-CM | POA: Insufficient documentation

## 2024-01-29 LAB — HEPATITIS PANEL, ACUTE
HCV Ab: NONREACTIVE
Hep A IgM: NONREACTIVE
Hep B C IgM: NONREACTIVE
Hepatitis B Surface Ag: NONREACTIVE

## 2024-01-29 LAB — CBC WITH DIFFERENTIAL (CANCER CENTER ONLY)
Abs Immature Granulocytes: 0.04 10*3/uL (ref 0.00–0.07)
Basophils Absolute: 0.1 10*3/uL (ref 0.0–0.1)
Basophils Relative: 1 %
Eosinophils Absolute: 0.1 10*3/uL (ref 0.0–0.5)
Eosinophils Relative: 3 %
HCT: 49.6 % (ref 39.0–52.0)
Hemoglobin: 16.7 g/dL (ref 13.0–17.0)
Immature Granulocytes: 1 %
Lymphocytes Relative: 15 %
Lymphs Abs: 0.7 10*3/uL (ref 0.7–4.0)
MCH: 29.1 pg (ref 26.0–34.0)
MCHC: 33.7 g/dL (ref 30.0–36.0)
MCV: 86.4 fL (ref 80.0–100.0)
Monocytes Absolute: 0.4 10*3/uL (ref 0.1–1.0)
Monocytes Relative: 7 %
Neutro Abs: 3.7 10*3/uL (ref 1.7–7.7)
Neutrophils Relative %: 73 %
Platelet Count: 191 10*3/uL (ref 150–400)
RBC: 5.74 MIL/uL (ref 4.22–5.81)
RDW: 13.2 % (ref 11.5–15.5)
WBC Count: 5 10*3/uL (ref 4.0–10.5)
nRBC: 0 % (ref 0.0–0.2)

## 2024-01-29 LAB — CMP (CANCER CENTER ONLY)
ALT: 28 U/L (ref 0–44)
AST: 22 U/L (ref 15–41)
Albumin: 3.7 g/dL (ref 3.5–5.0)
Alkaline Phosphatase: 69 U/L (ref 38–126)
Anion gap: 9 (ref 5–15)
BUN: 17 mg/dL (ref 8–23)
CO2: 25 mmol/L (ref 22–32)
Calcium: 8.6 mg/dL — ABNORMAL LOW (ref 8.9–10.3)
Chloride: 103 mmol/L (ref 98–111)
Creatinine: 1.12 mg/dL (ref 0.61–1.24)
GFR, Estimated: >60 mL/min (ref >60)
Glucose, Bld: 188 mg/dL — ABNORMAL HIGH (ref 70–99)
Potassium: 3.6 mmol/L (ref 3.5–5.1)
Sodium: 137 mmol/L (ref 135–145)
Total Bilirubin: 0.8 mg/dL (ref 0.0–1.2)
Total Protein: 6.6 g/dL (ref 6.5–8.1)

## 2024-01-29 LAB — LACTATE DEHYDROGENASE: LDH: 128 U/L (ref 98–192)

## 2024-01-29 NOTE — Assessment & Plan Note (Addendum)
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  Currently on surveillance; AUG 2023-enlarged but stable eccentric 3.3 x 2 cm cm in right lower quadrant.   Jul 17, 2023-  Slight interval increase in size of the mesenteric and retroperitoneal lymph nodes. No splenomegaly or thoracic lymphadenopathy. FEB PET scan- 2025- pending.   #  await the results of the PET scan- If worsening recommend rituximab. #  Discussed mechanism of action of rituximab;and also potential side effects including but not limited to infusion reactions;rare infections/activation including hepatitis.  Check hepatitis work-up at baseline.    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 Nausea- ? Etiology- denies anti-emetics.   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 stable   # Prostate enlargement- [on Flomax]- recommend evaluation with urology. Defer to PCP.  my chart appt Q 72m # DISPOSITION: # TBD-Dr.B ----------------------- # follow up in 6 months; MD- labs- CBC/MP/LDH--Dr.B

## 2024-01-29 NOTE — Progress Notes (Signed)
 [facility] OFFICE PROGRESS NOTE  Patient Care Team: Danella Penton, MD as PCP - General (Internal Medicine) Gladis Riffle, MD as Consulting Physician (Surgery) Stanton Kidney, MD as Consulting Physician (Gastroenterology) Earna Coder, MD as Consulting Physician (Internal Medicine)   Cancer Staging  No matching staging information was found for the patient.    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 Recm surviellance; Dr.Crystal.  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 Alone.   Troy Reynolds 73 y.o.  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  Patient complains of ongoing constipation. Blood on wiping/straining. Constipation relieved by stool softeners and miralax.  Colo-2021. Denies abdominal  pain  Denies any fevers, night sweats, swollen lymph.  Review of Systems  Constitutional:  Negative for chills, diaphoresis, fever and weight loss.  HENT:  Negative for nosebleeds and sore throat.   Eyes:  Negative for double vision.  Respiratory:  Negative for cough, hemoptysis, sputum production, shortness of breath and wheezing.   Cardiovascular:  Negative for chest pain, palpitations, orthopnea and leg swelling.  Gastrointestinal:  Positive for constipation. Negative for abdominal pain, blood in stool, diarrhea, heartburn, melena and vomiting.       Abdominal discomfort/bloating.  Genitourinary:  Negative for dysuria, frequency and urgency.  Musculoskeletal:  Negative for back pain and joint pain.  Skin: Negative.  Negative for itching and rash.  Neurological:  Negative for dizziness, tingling, focal weakness, weakness and headaches.  Endo/Heme/Allergies:  Does not bruise/bleed easily.  Psychiatric/Behavioral:  Negative for depression. The patient is not nervous/anxious and does not have insomnia.     PAST MEDICAL HISTORY :  Past Medical History:  Diagnosis Date   Actinic keratosis    Aortic atherosclerosis (HCC)    Arthritis    B12 deficiency    Bilateral inguinal hernia    BPH (benign prostatic hyperplasia)    Colon polyp, hyperplastic    Crohn's disease (HCC)    Diverticulosis    DLBCL (diffuse large B cell lymphoma) (HCC) 2014   Chemo tx's.   GERD (gastroesophageal reflux disease)    Hiatal hernia    HLD (hyperlipidemia)    Hypertension    Hypothyroid    Kidney stones    OSA on CPAP    Primary Parkinson's disease (HCC)    RBBB (right bundle branch block)    Renal cyst, left    Skin cancer    R jaw - treated by Dr. Orson Aloe many years ago -  unsure which type   Thoracic aortic aneurysm (HCC) 07/17/2020   a.) CT AP 07/17/2020: 4.2 cm; b.) CT AP 07/17/2021: 4.1 cm; c.) CT AP 07/02/2022: 4.1 cm   TIA (transient ischemic attack)     PAST SURGICAL HISTORY :    Past Surgical History:  Procedure Laterality Date   BOWEL RESECTION N/A 06/19/2016   Procedure: SMALL BOWEL RESECTION;  Surgeon: Gladis Riffle, MD;  Location: [facility];  Service: General;  Laterality: N/A;   CHOLECYSTECTOMY     COLONOSCOPY     COLONOSCOPY WITH PROPOFOL N/A 07/19/2021   Procedure: COLONOSCOPY WITH PROPOFOL;  Surgeon: Regis Bill, MD;  Location: [facility];  Service: Endoscopy;  Laterality: N/A;   ESOPHAGOGASTRODUODENOSCOPY     ESOPHAGOGASTRODUODENOSCOPY (EGD) WITH PROPOFOL N/A 07/19/2021   Procedure: ESOPHAGOGASTRODUODENOSCOPY (EGD) WITH PROPOFOL;  Surgeon: Regis Bill, MD;  Location: [facility];  Service: Endoscopy;  Laterality: N/A;   LAPAROSCOPIC REMOVAL OF MESENTERIC MASS N/A 06/19/2016   Procedure: LAPAROSCOPIC REMOVAL OF MESENTERIC MASS;  Surgeon: Gladis Riffle, MD;  Location: [facility];  Service: General;  Laterality: N/A;   LYMPH NODE DISSECTION     PORT-A-CATH REMOVAL     PORTACATH PLACEMENT      FAMILY HISTORY :   Family History  Problem Relation Age of Onset   Clotting disorder Mother    Pulmonary embolism Mother    Lymphoma Father    Hiatal hernia Father    Heart disease Father        stent placed   Hypertension Father    Ulcers Father    Brain cancer Maternal Aunt    Cancer Paternal Uncle        esophagus    SOCIAL HISTORY:   Social History   Tobacco Use   Smoking status: Never   Smokeless tobacco: Never  Vaping Use   Vaping status: Never Used  Substance Use Topics   Alcohol use: No   Drug use: No    ALLERGIES:  has no known allergies.  MEDICATIONS:  Current Outpatient Medications  Medication Sig Dispense Refill   carbidopa-levodopa (SINEMET CR) 50-200 MG tablet Take 1 tablet by mouth 2 (two) times daily.     cyanocobalamin (VITAMIN B12) 1000 MCG tablet Take 1,000 mcg by mouth daily.     levothyroxine (SYNTHROID) 75 MCG tablet 100 mcg.     losartan-hydrochlorothiazide (HYZAAR) 50-12.5 MG per tablet  TAKE 1 TABLET DAILY.     naproxen sodium (ALEVE) 220 MG tablet Take 220 mg by mouth daily as needed.     pantoprazole (PROTONIX) 40 MG tablet Take 40 mg by mouth daily.      simvastatin (ZOCOR) 20 MG tablet Take 1 tablet by mouth daily.     tamsulosin (FLOMAX) 0.4 MG CAPS capsule Take 0.4 mg by mouth daily.     No current facility-administered medications for this visit.    PHYSICAL EXAMINATION: ECOG PERFORMANCE STATUS: 1 - Symptomatic but completely ambulatory  BP 125/79 (BP Location: Left Arm, Patient Position: Sitting, Cuff Size: Large)   Pulse 80   Temp (!) 97.1 F (36.2 C) (Tympanic)   Resp 18   SpO2 100%   There were no vitals filed for this visit.  Physical Exam HENT:     Head: Normocephalic and atraumatic.     Mouth/Throat:     Pharynx: No oropharyngeal exudate.  Eyes:     Pupils: Pupils are equal, round, and reactive to light.  Cardiovascular:     Rate and Rhythm:  Normal rate and regular rhythm.  Pulmonary:     Effort: No respiratory distress.     Breath sounds: No wheezing.  Abdominal:     General: Bowel sounds are normal. There is no distension.     Palpations: Abdomen is soft. There is no mass.     Tenderness: There is no abdominal tenderness. There is no guarding or rebound.     Comments: Midabdominal incision noted.  Well-healed.  Musculoskeletal:        General: No tenderness. Normal range of motion.     Cervical back: Normal range of motion and neck supple.  Skin:    General: Skin is warm.  Neurological:     Mental Status: He is alert and oriented to person, place, and time.  Psychiatric:        Mood and Affect: Affect normal.       LABORATORY DATA:  I have reviewed the data as listed    Component Value Date/Time   NA 137 01/29/2024 1014   NA 140 03/12/2015 1007   K 3.6 01/29/2024 1014   K 3.7 03/12/2015 1007   CL 103 01/29/2024 1014   CL 105 03/12/2015 1007   CO2 25 01/29/2024 1014   CO2 29 03/12/2015 1007   GLUCOSE 188 (H) 01/29/2024  1014   GLUCOSE 143 (H) 03/12/2015 1007   BUN 17 01/29/2024 1014   BUN 15 03/12/2015 1007   CREATININE 1.12 01/29/2024 1014   CREATININE 0.92 03/12/2015 1007   CALCIUM 8.6 (L) 01/29/2024 1014   CALCIUM 8.6 (L) 03/12/2015 1007   PROT 6.6 01/29/2024 1014   PROT 6.6 03/12/2015 1007   ALBUMIN 3.7 01/29/2024 1014   ALBUMIN 4.0 03/12/2015 1007   AST 22 01/29/2024 1014   ALT 28 01/29/2024 1014   ALT 37 03/12/2015 1007   ALKPHOS 69 01/29/2024 1014   ALKPHOS 79 03/12/2015 1007   BILITOT 0.8 01/29/2024 1014   GFRNONAA >60 01/29/2024 1014   GFRNONAA >60 03/12/2015 1007   GFRAA >60 07/19/2020 1011   GFRAA >60 03/12/2015 1007    No results found for: "SPEP", "UPEP"  Lab Results  Component Value Date   WBC 5.0 01/29/2024   NEUTROABS 3.7 01/29/2024   HGB 16.7 01/29/2024   HCT 49.6 01/29/2024   MCV 86.4 01/29/2024   PLT 191 01/29/2024      Chemistry      Component Value Date/Time   NA 137 01/29/2024 1014   NA 140 03/12/2015 1007   K 3.6 01/29/2024 1014   K 3.7 03/12/2015 1007   CL 103 01/29/2024 1014   CL 105 03/12/2015 1007   CO2 25 01/29/2024 1014   CO2 29 03/12/2015 1007   BUN 17 01/29/2024 1014   BUN 15 03/12/2015 1007   CREATININE 1.12 01/29/2024 1014   CREATININE 0.92 03/12/2015 1007      Component Value Date/Time   CALCIUM 8.6 (L) 01/29/2024 1014   CALCIUM 8.6 (L) 03/12/2015 1007   ALKPHOS 69 01/29/2024 1014   ALKPHOS 79 03/12/2015 1007   AST 22 01/29/2024 1014   ALT 28 01/29/2024 1014   ALT 37 03/12/2015 1007   BILITOT 0.8 01/29/2024 1014       RADIOGRAPHIC STUDIES: I have personally reviewed the radiological images as listed and agreed with the findings in the report. No results found.   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  Currently on surveillance;  AUG 2023-enlarged but stable eccentric 3.3 x 2 cm cm in right lower quadrant.   Jul 17, 2023-   Slight interval increase in size of the mesenteric and retroperitoneal lymph nodes. No splenomegaly or thoracic lymphadenopathy. FEB PET scan- 2025- pending.   #  await the results of the PET scan- If worsening recommend rituximab. #  Discussed mechanism of action of rituximab;and also potential side effects including but not limited to infusion reactions;rare infections/activation including hepatitis.  Check hepatitis work-up at baseline.    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 Nausea- ? Etiology- denies anti-emetics.   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 stable   # Prostate enlargement- [on Flomax]- recommend evaluation with urology. Defer to PCP.  my chart appt Q 42m # DISPOSITION: # TBD-Dr.B ----------------------- # follow up in 6 months; MD- labs- CBC/MP/LDH--Dr.B     No orders of the defined types were placed in this encounter.  All questions were answered. The patient knows to call the clinic with any problems, questions or concerns.      Earna Coder, MD 01/29/2024 4:22 PM

## 2024-01-29 NOTE — Progress Notes (Signed)
 Patient had a PET scan on 01/20/2024. Ongoing for about 2 weeks now he has been having some constipation.

## 2024-01-30 LAB — HEPATITIS B SURFACE ANTIBODY, QUANTITATIVE: Hep B S AB Quant (Post): <3.5 m[IU]/mL — ABNORMAL LOW

## 2024-02-08 ENCOUNTER — Other Ambulatory Visit: Payer: Self-pay | Admitting: Internal Medicine

## 2024-02-11 ENCOUNTER — Other Ambulatory Visit

## 2024-02-12 ENCOUNTER — Telehealth: Payer: Self-pay | Admitting: Internal Medicine

## 2024-02-12 NOTE — Telephone Encounter (Signed)
 Discussed at tumor conference-shows progressive disease.  Reviewed with the patient over the phone.  Recommend follow-up-the next 2 to 3 weeks to go over his options-include surveillance at closer intervals vs-treatments.   Continue please schedule follow-up-the next 2 to 3 weeks- MD: no labs-   GB

## 2024-02-23 ENCOUNTER — Other Ambulatory Visit: Payer: Self-pay | Admitting: Internal Medicine

## 2024-02-23 ENCOUNTER — Inpatient Hospital Stay: Attending: Internal Medicine | Admitting: Internal Medicine

## 2024-02-23 ENCOUNTER — Encounter: Payer: Self-pay | Admitting: Internal Medicine

## 2024-02-23 VITALS — BP 138/88 | HR 83 | Temp 97.6°F | Resp 16 | Wt 207.0 lb

## 2024-02-23 DIAGNOSIS — Z79899 Other long term (current) drug therapy: Secondary | ICD-10-CM | POA: Diagnosis not present

## 2024-02-23 DIAGNOSIS — C8203 Follicular lymphoma grade I, intra-abdominal lymph nodes: Secondary | ICD-10-CM | POA: Diagnosis not present

## 2024-02-23 DIAGNOSIS — Z9221 Personal history of antineoplastic chemotherapy: Secondary | ICD-10-CM | POA: Insufficient documentation

## 2024-02-23 DIAGNOSIS — N400 Enlarged prostate without lower urinary tract symptoms: Secondary | ICD-10-CM | POA: Insufficient documentation

## 2024-02-23 DIAGNOSIS — Z923 Personal history of irradiation: Secondary | ICD-10-CM | POA: Insufficient documentation

## 2024-02-23 DIAGNOSIS — C8333 Diffuse large B-cell lymphoma, intra-abdominal lymph nodes: Secondary | ICD-10-CM | POA: Insufficient documentation

## 2024-02-23 NOTE — Progress Notes (Signed)
 [facility] OFFICE PROGRESS NOTE  Patient Care Team: Danella Penton, MD as PCP - General (Internal Medicine) Gladis Riffle, MD as Consulting Physician (Surgery) Stanton Kidney, MD as Consulting Physician (Gastroenterology) Earna Coder, MD as Consulting Physician (Internal Medicine)   Cancer Staging  No matching staging information was found for the patient.    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 Recm surviellance; Dr.Crystal.  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 With daughter- in-law   Troy Reynolds 73 y.o.  male pleasant patient above prior history of diffuse large B cell  lymphoma [2014] and history of follicular lymphoma G-1 [2017] currently on surveillance is here/for a follow up; and review the results of PET scan.  Patient has  intermittent constipation on laxatives.  Not any worse.  Otherwise no significant nausea or vomiting.  No fever no chills.  No weight loss.  Denies abdominal pain. Complains of mild nausea; but no vomiting or lumps or bumps.  Review of Systems  Constitutional:  Negative for chills, diaphoresis, fever and weight loss.  HENT:  Negative for nosebleeds and sore throat.   Eyes:  Negative for double vision.  Respiratory:  Negative for cough, hemoptysis, sputum production, shortness of breath and wheezing.   Cardiovascular:  Negative for chest pain, palpitations, orthopnea and leg swelling.  Gastrointestinal:  Positive for constipation. Negative for abdominal pain, blood in stool, diarrhea, heartburn, melena and vomiting.       Abdominal discomfort/bloating.  Genitourinary:  Negative for dysuria, frequency and urgency.  Musculoskeletal:  Negative for back pain and joint pain.  Skin: Negative.  Negative for itching and rash.  Neurological:  Negative for dizziness, tingling, focal weakness, weakness and headaches.  Endo/Heme/Allergies:  Does not bruise/bleed easily.  Psychiatric/Behavioral:  Negative for depression. The patient is not nervous/anxious and does not have insomnia.     PAST MEDICAL HISTORY :  Past Medical History:  Diagnosis Date   Actinic keratosis    Aortic atherosclerosis (HCC)    Arthritis    B12 deficiency    Bilateral inguinal hernia    BPH (benign prostatic hyperplasia)    Colon polyp, hyperplastic    Crohn's disease (HCC)    Diverticulosis    DLBCL (diffuse large B cell lymphoma) (HCC) 2014   Chemo tx's.   GERD (gastroesophageal reflux disease)    Hiatal hernia    HLD (hyperlipidemia)    Hypertension    Hypothyroid    Kidney stones    OSA  on CPAP    Primary Parkinson's disease (HCC)    RBBB (right bundle branch block)    Renal cyst, left    Skin cancer    R jaw - treated by Dr. Orson Aloe many years ago - unsure which type   Thoracic aortic aneurysm (HCC)  07/17/2020   a.) CT AP 07/17/2020: 4.2 cm; b.) CT AP 07/17/2021: 4.1 cm; c.) CT AP 07/02/2022: 4.1 cm   TIA (transient ischemic attack)     PAST SURGICAL HISTORY :   Past Surgical History:  Procedure Laterality Date   BOWEL RESECTION N/A 06/19/2016   Procedure: SMALL BOWEL RESECTION;  Surgeon: Gladis Riffle, MD;  Location: [facility];  Service: General;  Laterality: N/A;   CHOLECYSTECTOMY     COLONOSCOPY     COLONOSCOPY WITH PROPOFOL N/A 07/19/2021   Procedure: COLONOSCOPY WITH PROPOFOL;  Surgeon: Regis Bill, MD;  Location: [facility];  Service: Endoscopy;  Laterality: N/A;   ESOPHAGOGASTRODUODENOSCOPY     ESOPHAGOGASTRODUODENOSCOPY (EGD) WITH PROPOFOL N/A 07/19/2021   Procedure: ESOPHAGOGASTRODUODENOSCOPY (EGD) WITH PROPOFOL;  Surgeon: Regis Bill, MD;  Location: [facility];  Service: Endoscopy;  Laterality: N/A;   LAPAROSCOPIC REMOVAL OF MESENTERIC MASS N/A 06/19/2016   Procedure: LAPAROSCOPIC REMOVAL OF MESENTERIC MASS;  Surgeon: Gladis Riffle, MD;  Location: [facility];  Service: General;  Laterality: N/A;   LYMPH NODE DISSECTION     PORT-A-CATH REMOVAL     PORTACATH PLACEMENT      FAMILY HISTORY :   Family History  Problem Relation Age of Onset   Clotting disorder Mother    Pulmonary embolism Mother    Lymphoma Father    Hiatal hernia Father    Heart disease Father        stent placed   Hypertension Father    Ulcers Father    Brain cancer Maternal Aunt    Cancer Paternal Uncle        esophagus    SOCIAL HISTORY:   Social History   Tobacco Use   Smoking status: Never   Smokeless tobacco: Never  Vaping Use   Vaping status: Never Used  Substance Use Topics   Alcohol use: No   Drug use: No    ALLERGIES:  has no known allergies.  MEDICATIONS:  Current Outpatient Medications  Medication Sig Dispense Refill   carbidopa-levodopa (SINEMET CR) 50-200 MG tablet Take 1 tablet by mouth 2 (two) times daily.     cyanocobalamin (VITAMIN  B12) 1000 MCG tablet Take 1,000 mcg by mouth daily.     levothyroxine (SYNTHROID) 75 MCG tablet 100 mcg.     losartan-hydrochlorothiazide (HYZAAR) 50-12.5 MG per tablet TAKE 1 TABLET DAILY.     naproxen sodium (ALEVE) 220 MG tablet Take 220 mg by mouth daily as needed.     pantoprazole (PROTONIX) 40 MG tablet Take 40 mg by mouth daily.      simvastatin (ZOCOR) 20 MG tablet Take 1 tablet by mouth daily.     tamsulosin (FLOMAX) 0.4 MG CAPS capsule Take 0.4 mg by mouth daily.     No current facility-administered medications for this visit.    PHYSICAL EXAMINATION: ECOG PERFORMANCE STATUS: 1 - Symptomatic but completely ambulatory  BP 138/88 (BP Location: Left Arm, Patient Position: Sitting)   Pulse 83   Temp 97.6 F (36.4 C) (Tympanic)   Resp 16   Wt 207 lb (93.9 kg)   BMI 28.07 kg/m   Filed Weights   02/23/24 1028  Weight: 207 lb (93.9  kg)    Physical Exam HENT:     Head: Normocephalic and atraumatic.     Mouth/Throat:     Pharynx: No oropharyngeal exudate.  Eyes:     Pupils: Pupils are equal, round, and reactive to light.  Cardiovascular:     Rate and Rhythm: Normal rate and regular rhythm.  Pulmonary:     Effort: No respiratory distress.     Breath sounds: No wheezing.  Abdominal:     General: Bowel sounds are normal. There is no distension.     Palpations: Abdomen is soft. There is no mass.     Tenderness: There is no abdominal tenderness. There is no guarding or rebound.     Comments: Midabdominal incision noted.  Well-healed.  Musculoskeletal:        General: No tenderness. Normal range of motion.     Cervical back: Normal range of motion and neck supple.  Skin:    General: Skin is warm.  Neurological:     Mental Status: He is alert and oriented to person, place, and time.  Psychiatric:        Mood and Affect: Affect normal.       LABORATORY DATA:  I have reviewed the data as listed    Component Value Date/Time   NA 137 01/29/2024 1014   NA 140  03/12/2015 1007   K 3.6 01/29/2024 1014   K 3.7 03/12/2015 1007   CL 103 01/29/2024 1014   CL 105 03/12/2015 1007   CO2 25 01/29/2024 1014   CO2 29 03/12/2015 1007   GLUCOSE 188 (H) 01/29/2024 1014   GLUCOSE 143 (H) 03/12/2015 1007   BUN 17 01/29/2024 1014   BUN 15 03/12/2015 1007   CREATININE 1.12 01/29/2024 1014   CREATININE 0.92 03/12/2015 1007   CALCIUM 8.6 (L) 01/29/2024 1014   CALCIUM 8.6 (L) 03/12/2015 1007   PROT 6.6 01/29/2024 1014   PROT 6.6 03/12/2015 1007   ALBUMIN 3.7 01/29/2024 1014   ALBUMIN 4.0 03/12/2015 1007   AST 22 01/29/2024 1014   ALT 28 01/29/2024 1014   ALT 37 03/12/2015 1007   ALKPHOS 69 01/29/2024 1014   ALKPHOS 79 03/12/2015 1007   BILITOT 0.8 01/29/2024 1014   GFRNONAA >60 01/29/2024 1014   GFRNONAA >60 03/12/2015 1007   GFRAA >60 07/19/2020 1011   GFRAA >60 03/12/2015 1007    No results found for: "SPEP", "UPEP"  Lab Results  Component Value Date   WBC 5.0 01/29/2024   NEUTROABS 3.7 01/29/2024   HGB 16.7 01/29/2024   HCT 49.6 01/29/2024   MCV 86.4 01/29/2024   PLT 191 01/29/2024      Chemistry      Component Value Date/Time   NA 137 01/29/2024 1014   NA 140 03/12/2015 1007   K 3.6 01/29/2024 1014   K 3.7 03/12/2015 1007   CL 103 01/29/2024 1014   CL 105 03/12/2015 1007   CO2 25 01/29/2024 1014   CO2 29 03/12/2015 1007   BUN 17 01/29/2024 1014   BUN 15 03/12/2015 1007   CREATININE 1.12 01/29/2024 1014   CREATININE 0.92 03/12/2015 1007      Component Value Date/Time   CALCIUM 8.6 (L) 01/29/2024 1014   CALCIUM 8.6 (L) 03/12/2015 1007   ALKPHOS 69 01/29/2024 1014   ALKPHOS 79 03/12/2015 1007   AST 22 01/29/2024 1014   ALT 28 01/29/2024 1014   ALT 37 03/12/2015 1007   BILITOT 0.8 01/29/2024 1014  RADIOGRAPHIC STUDIES: I have personally reviewed the radiological images as listed and agreed with the findings in the report. No results found.   ASSESSMENT & PLAN:  Follicular lymphoma grade I of intra-abdominal lymph  nodes (HCC) #Follicular lymphoma of the abdomen status post excision ; G-1.  Currently on surveillance; AUG 2023-enlarged but stable eccentric 3.3 x 2 cm cm in right lower quadrant.     # Jul 17, 2023-  Slight interval increase in size of the mesenteric and retroperitoneal lymph nodes. No splenomegaly or thoracic lymphadenopathy.   # FEB PET scan- 2025- Progressive hypermetabolic abdominal and pelvic adenopathy, measuring up into the low Deauville 5 range.No splenomegaly or focal hypermetabolic splenic activity.  # Given the progressive lymphadenopathy concerning for progression of his follicle lymphoma.  No clinical concern of transformation or recurrence of diffuse large B-cell lymphoma.  #  Discussed mechanism of action of rituximab;and also potential side effects including but not limited to infusion reactions;rare infections/activation including hepatitis.  Check hepatitis work-up at baseline.    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 Nausea- ? Etiology- denies anti-emetics.   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 stable   # Prostate enlargement- [on Flomax]- recommend evaluation with urology. Defer to PCP.  # IV access:   my chart appt Q 82m # DISPOSITION: #Follow up  on June 6th- MD- labs- CBC/MP/LDH-_Rituxan #  weekly Rituxan x3 more- Dr. Leonard Schwartz     Orders Placed This Encounter  Procedures   CBC with Differential (Cancer Center Only)    Standing Status:   Future    Expected Date:   04/29/2024    Expiration Date:   02/22/2025   CMP (Cancer Center only)    Standing Status:   Future    Expected Date:   04/29/2024    Expiration Date:   02/22/2025   Lactate dehydrogenase    Standing Status:   Future    Expected Date:   04/29/2024    Expiration Date:   02/22/2025   All questions were  answered. The patient knows to call the clinic with any problems, questions or concerns.      Earna Coder, MD 02/23/2024 1:53 PM

## 2024-02-23 NOTE — Progress Notes (Signed)
START ON PATHWAY REGIMEN - Lymphoma and CLL     A cycle is every 7 days:     Rituximab-xxxx   **Always confirm dose/schedule in your pharmacy ordering system**  Patient Characteristics: Follicular Lymphoma, Grades 1, 2, and 3A, First Line, Stage I / II Disease Type: Follicular Lymphoma, Grade 1, 2, or 3A Disease Type: Not Applicable Disease Type: Not Applicable Line of Therapy: First Line Intent of Therapy: Non-Curative / Palliative Intent, Discussed with Patient

## 2024-02-23 NOTE — Assessment & Plan Note (Addendum)
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  # FEB PET scan- 2025- Progressive hypermetabolic abdominal and pelvic adenopathy, measuring up into the low Deauville 5 range.No splenomegaly or focal hypermetabolic splenic activity.  # Patient is currently on surveillance.  He continues to be asymptomatic.  However given the progressive lymphadenopathy concerning for progression of his follicle lymphoma-recommend consideration of therapeutic option.  # Discussed options still include surveillance as patient is not asymptomatic.  However am concerned the patient may have have progressive symptoms if not treated sooner than later.    # Discussed the option of using rituximab; and also Bendamustine rituximab-discussed the pros and cons of each treatment option.  I think is reasonable to proceed with rituximab single agent given the low volume disease and asymptomatic nature.Again patient understands that treatments are to control the disease and not curative.  #  Discussed mechanism of action of rituximab;and also potential side effects including but not limited to infusion reactions;rare infections/activation including hepatitis.  Check hepatitis work-up at baseline.    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  # Reflux- on PPI- ? Nausea- ? Etiology- denies anti-emetics.   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 stable   # Prostate enlargement- [on Flomax]- recommend evaluation with urology. Defer to PCP.  # IV access: PIV  my chart appt Q 59m # DISPOSITION: #Follow up  on June 6th- MD- labs- CBC/MP/LDH-_Rituxan #  weekly Rituxan x3 more- Dr. B  # I reviewed the blood work- with the patient in detail; also reviewed the imaging independently [as summarized above]; and with the patient in  detail.    ,# 40 minutes face-to-face with the patient discussing the above plan of care; more than 50% of time spent on prognosis/ natural history; counseling and coordination.

## 2024-02-23 NOTE — Progress Notes (Signed)
 Pt and daughter in law in for follow up. Denies any concerns today.

## 2024-02-24 ENCOUNTER — Other Ambulatory Visit: Payer: Self-pay

## 2024-02-25 ENCOUNTER — Other Ambulatory Visit: Payer: Self-pay

## 2024-02-26 ENCOUNTER — Other Ambulatory Visit: Payer: Self-pay

## 2024-02-26 ENCOUNTER — Emergency Department
Admission: EM | Admit: 2024-02-26 | Discharge: 2024-02-26 | Disposition: A | Discharge status: Home / Self Care | Attending: Emergency Medicine | Admitting: Emergency Medicine

## 2024-02-26 DIAGNOSIS — G20C Parkinsonism, unspecified: Secondary | ICD-10-CM | POA: Diagnosis not present

## 2024-02-26 DIAGNOSIS — K5901 Slow transit constipation: Secondary | ICD-10-CM | POA: Insufficient documentation

## 2024-02-26 DIAGNOSIS — E039 Hypothyroidism, unspecified: Secondary | ICD-10-CM | POA: Insufficient documentation

## 2024-02-26 DIAGNOSIS — K5900 Constipation, unspecified: Secondary | ICD-10-CM | POA: Diagnosis present

## 2024-02-26 LAB — BASIC METABOLIC PANEL WITH GFR
Anion gap: 9 (ref 5–15)
BUN: 15 mg/dL (ref 8–23)
CO2: 26 mmol/L (ref 22–32)
Calcium: 9 mg/dL (ref 8.9–10.3)
Chloride: 104 mmol/L (ref 98–111)
Creatinine, Ser: 1.08 mg/dL (ref 0.61–1.24)
GFR, Estimated: >60 mL/min (ref >60)
Glucose, Bld: 101 mg/dL — ABNORMAL HIGH (ref 70–99)
Potassium: 3.6 mmol/L (ref 3.5–5.1)
Sodium: 139 mmol/L (ref 135–145)

## 2024-02-26 LAB — CBC
HCT: 50.6 % (ref 39.0–52.0)
Hemoglobin: 17.6 g/dL — ABNORMAL HIGH (ref 13.0–17.0)
MCH: 29.6 pg (ref 26.0–34.0)
MCHC: 34.8 g/dL (ref 30.0–36.0)
MCV: 85.2 fL (ref 80.0–100.0)
Platelets: 215 10*3/uL (ref 150–400)
RBC: 5.94 MIL/uL — ABNORMAL HIGH (ref 4.22–5.81)
RDW: 13.2 % (ref 11.5–15.5)
WBC: 5 10*3/uL (ref 4.0–10.5)
nRBC: 0 % (ref 0.0–0.2)

## 2024-02-26 NOTE — Discharge Instructions (Addendum)
 Use MiraLAX, or its generic equivalent like ClearLax, for your constipation.  Mix this white powder in your favorite noncarbonated drink, such as milk, water or juice.  To begin with and to get things moving, use 2 capfuls up to twice per day.    Once you are passing more regular bowel movements, cut back to about 1 capful once or twice a day.    It is safe to use suppositories or enemas with this medication.  Generic glycerin suppositories can be quite helpful  Please stay hydrated and drink plenty of fluids while you are taking this medication.

## 2024-02-26 NOTE — ED Triage Notes (Addendum)
 Pt comes with c/o constipation. Pt states he has not had normal BM since Jan. Pt states his pcp advised him to come here. Pt states no pain.   Pt states some new onset of Parkinsons and was placed on new meds in Jan so not sure if that is what is causing this. Pt states his stools are just all over the place and different sizes/shapes.  Pt also states he is suppose to start treatments for his cancer in June. Follicular lymphoma grade I of intra-abdominal lymph nodes

## 2024-02-26 NOTE — ED Provider Notes (Signed)
 [facility] Provider Note    Event Date/Time   First MD Initiated Contact with Patient 02/26/24 [phone]     (approximate)   History   Constipation   HPI  DARA CAMARGO is a 73 y.o. male who presents to the ED for evaluation of Constipation   Reviewed oncology visit from 3 days ago.  History of follicular lymphoma, Parkinson's disease, GERD, hypothyroidism.  Patient presents for evaluation of chronic constipation.  Reports low-volume intermittent bowel movements for the past 2-3 months.  No abdominal pain, nausea, emesis, bloody stool, dysuria or new incontinence.  Reports trying a single capful of polyethylene glycol without effect, using 1 suppository without effect.  Called with his PCP and GI doctor and they told him to go to the ED.   Physical Exam   Triage Vital Signs: ED Triage Vitals  Encounter Vitals Group     BP 02/26/24 0940 (!) 152/101     Systolic BP Percentile --      Diastolic BP Percentile --      Pulse Rate 02/26/24 0940 87     Resp 02/26/24 0940 17     Temp 02/26/24 0940 98 F (36.7 C)     Temp src --      SpO2 02/26/24 0940 100 %     Weight --      Height --      Head Circumference --      Peak Flow --      Pain Score 02/26/24 0938 0     Pain Loc --      Pain Education --      Exclude from Growth Chart --     Most recent vital signs: Vitals:   02/26/24 0940  BP: (!) 152/101  Pulse: 87  Resp: 17  Temp: 98 F (36.7 C)  SpO2: 100%    General: Awake, no distress.  Sitting upright on a bedside chair, pleasant and conversational, well-appearing CV:  Good peripheral perfusion.  Resp:  Normal effort.  Abd:  No distention.  Soft and benign throughout without tenderness, guarding or peritoneal features MSK:  No deformity noted.  Neuro:  No focal deficits appreciated. Other:     ED Results / Procedures / Treatments   Labs (all labs ordered are listed, but only abnormal results are displayed) Labs Reviewed  CBC -  Abnormal; Notable for the following components:      Result Value   RBC 5.94 (*)    Hemoglobin 17.6 (*)    All other components within normal limits  BASIC METABOLIC PANEL WITH GFR - Abnormal; Notable for the following components:   Glucose, Bld 101 (*)    All other components within normal limits    EKG   RADIOLOGY   Official radiology report(s): No results found.  PROCEDURES and INTERVENTIONS:  Procedures  Medications - No data to display   IMPRESSION / MDM / ASSESSMENT AND PLAN / ED COURSE  I reviewed the triage vital signs and the nursing notes.  Differential diagnosis includes, but is not limited to, SBO, constipation, dehydration, neurogenic bowel, medication side effect  Patient presents with chronic constipation suitable for outpatient management.  No pain, tenderness or indications for further workup.  We had an extensive discussion, education regarding OTC medications, appropriate dosing of these medications, expectant management and ED return precautions.  Patient is suitable for outpatient management.     FINAL CLINICAL IMPRESSION(S) / ED DIAGNOSES   Final diagnoses:  Slow transit  constipation     Rx / DC Orders   ED Discharge Orders     None        Note:  This document was prepared using Dragon voice recognition software and may include unintentional dictation errors.   Delton [place]irie, MD 02/26/24 [phone]

## 2024-03-02 ENCOUNTER — Other Ambulatory Visit: Payer: Self-pay

## 2024-04-14 ENCOUNTER — Encounter: Payer: Self-pay | Admitting: Internal Medicine

## 2024-04-15 NOTE — Progress Notes (Signed)
 Pharmacist Chemotherapy Monitoring - Initial Assessment    Anticipated start date: 04/29/24   The following has been reviewed per standard work regarding the patient's treatment regimen: The patient's diagnosis, treatment plan and drug doses, and organ/hematologic function Lab orders and baseline tests specific to treatment regimen  The treatment plan start date, drug sequencing, and pre-medications Prior authorization status  Patient's documented medication list, including drug-drug interaction screen and prescriptions for anti-emetics and supportive care specific to the treatment regimen The drug concentrations, fluid compatibility, administration routes, and timing of the medications to be used The patient's access for treatment and lifetime cumulative dose history, if applicable  The patient's medication allergies and previous infusion related reactions, if applicable   Changes made to treatment plan:  N/A  Follow up needed:  Hep B labs ordered. Last negative on 01/29/24   Troy Reynolds, PharmD, BCPS Clinical Pharmacist   04/15/2024  11:50 AM

## 2024-04-21 ENCOUNTER — Encounter: Payer: Self-pay | Admitting: Internal Medicine

## 2024-04-21 ENCOUNTER — Ambulatory Visit: Payer: Medicare HMO | Admitting: Dermatology

## 2024-04-26 ENCOUNTER — Encounter: Payer: Self-pay | Admitting: Internal Medicine

## 2024-04-29 ENCOUNTER — Inpatient Hospital Stay: Attending: Internal Medicine

## 2024-04-29 ENCOUNTER — Inpatient Hospital Stay: Admitting: Internal Medicine

## 2024-04-29 ENCOUNTER — Inpatient Hospital Stay

## 2024-04-29 ENCOUNTER — Encounter: Payer: Self-pay | Admitting: Internal Medicine

## 2024-04-29 VITALS — BP 98/67 | HR 79 | Temp 96.6°F | Resp 16

## 2024-04-29 VITALS — BP 127/83 | HR 81 | Temp 97.0°F | Resp 17 | Ht 72.0 in | Wt 206.4 lb

## 2024-04-29 DIAGNOSIS — Z923 Personal history of irradiation: Secondary | ICD-10-CM | POA: Insufficient documentation

## 2024-04-29 DIAGNOSIS — Z5111 Encounter for antineoplastic chemotherapy: Secondary | ICD-10-CM | POA: Insufficient documentation

## 2024-04-29 DIAGNOSIS — N400 Enlarged prostate without lower urinary tract symptoms: Secondary | ICD-10-CM | POA: Diagnosis not present

## 2024-04-29 DIAGNOSIS — C8203 Follicular lymphoma grade I, intra-abdominal lymph nodes: Secondary | ICD-10-CM

## 2024-04-29 DIAGNOSIS — Z79899 Other long term (current) drug therapy: Secondary | ICD-10-CM | POA: Diagnosis not present

## 2024-04-29 DIAGNOSIS — K5900 Constipation, unspecified: Secondary | ICD-10-CM | POA: Insufficient documentation

## 2024-04-29 DIAGNOSIS — C8333 Diffuse large B-cell lymphoma, intra-abdominal lymph nodes: Secondary | ICD-10-CM | POA: Insufficient documentation

## 2024-04-29 LAB — CMP (CANCER CENTER ONLY)
ALT: 10 U/L (ref 0–44)
AST: 24 U/L (ref 15–41)
Albumin: 3.9 g/dL (ref 3.5–5.0)
Alkaline Phosphatase: 71 U/L (ref 38–126)
Anion gap: 9 (ref 5–15)
BUN: 16 mg/dL (ref 8–23)
CO2: 23 mmol/L (ref 22–32)
Calcium: 8.5 mg/dL — ABNORMAL LOW (ref 8.9–10.3)
Chloride: 106 mmol/L (ref 98–111)
Creatinine: 0.97 mg/dL (ref 0.61–1.24)
GFR, Estimated: >60 mL/min (ref >60)
Glucose, Bld: 191 mg/dL — ABNORMAL HIGH (ref 70–99)
Potassium: 3.6 mmol/L (ref 3.5–5.1)
Sodium: 138 mmol/L (ref 135–145)
Total Bilirubin: 0.9 mg/dL (ref 0.0–1.2)
Total Protein: 6.3 g/dL — ABNORMAL LOW (ref 6.5–8.1)

## 2024-04-29 LAB — CBC WITH DIFFERENTIAL (CANCER CENTER ONLY)
Abs Immature Granulocytes: 0.06 10*3/uL (ref 0.00–0.07)
Basophils Absolute: 0.1 10*3/uL (ref 0.0–0.1)
Basophils Relative: 1 %
Eosinophils Absolute: 0.2 10*3/uL (ref 0.0–0.5)
Eosinophils Relative: 3 %
HCT: 49 % (ref 39.0–52.0)
Hemoglobin: 16.5 g/dL (ref 13.0–17.0)
Immature Granulocytes: 1 %
Lymphocytes Relative: 18 %
Lymphs Abs: 0.9 10*3/uL (ref 0.7–4.0)
MCH: 29.3 pg (ref 26.0–34.0)
MCHC: 33.7 g/dL (ref 30.0–36.0)
MCV: 87 fL (ref 80.0–100.0)
Monocytes Absolute: 0.4 10*3/uL (ref 0.1–1.0)
Monocytes Relative: 8 %
Neutro Abs: 3.3 10*3/uL (ref 1.7–7.7)
Neutrophils Relative %: 69 %
Platelet Count: 202 10*3/uL (ref 150–400)
RBC: 5.63 MIL/uL (ref 4.22–5.81)
RDW: 13.5 % (ref 11.5–15.5)
WBC Count: 4.9 10*3/uL (ref 4.0–10.5)
nRBC: 0 % (ref 0.0–0.2)

## 2024-04-29 LAB — LACTATE DEHYDROGENASE: LDH: 133 U/L (ref 98–192)

## 2024-04-29 MED ORDER — SODIUM CHLORIDE 0.9 % IV SOLN
375.0000 mg/m2 | Freq: Once | INTRAVENOUS | Status: AC
  Administered 2024-04-29: 800 mg via INTRAVENOUS
  Filled 2024-04-29: qty 80

## 2024-04-29 MED ORDER — SODIUM CHLORIDE 0.9 % IV SOLN
INTRAVENOUS | Status: DC
  Filled 2024-04-29: qty 250

## 2024-04-29 MED ORDER — ACETAMINOPHEN 325 MG PO TABS
650.0000 mg | ORAL_TABLET | Freq: Once | ORAL | Status: AC
  Administered 2024-04-29: 650 mg via ORAL
  Filled 2024-04-29: qty 2

## 2024-04-29 MED ORDER — DIPHENHYDRAMINE HCL 25 MG PO CAPS
50.0000 mg | ORAL_CAPSULE | Freq: Once | ORAL | Status: AC
  Administered 2024-04-29: 50 mg via ORAL
  Filled 2024-04-29: qty 2

## 2024-04-29 NOTE — Assessment & Plan Note (Addendum)
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  # FEB PET scan- 2025- Progressive hypermetabolic abdominal and pelvic adenopathy, measuring up into the low Deauville 5 range.No splenomegaly or focal hypermetabolic splenic activity.   # given the progressive lymphadenopathy concerning for progression of his follicle lymphoma-recommend proceeding with weekly rituxan weekly x4.   # I again discussed mechanism of action of rituximab;and also potential side effects including but not limited to infusion reactions;rare infections/activation including hepatitis. hepatitis work-up NEGATIVE.   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  # Reflux- on PPI- ? Nausea- ? Etiology- denies anti-emetics.   # Parkinson- mild- carbi-dopa-love-dopa- [Dr.Shah]- stable   # COnstipation- recommend Miralax BID; add colace [? 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 stable   # Prostate enlargement- [on Flomax]- recommend evaluation with urology. Defer to PCP.  # IV access: PIV  my chart appt Q 60m  # DISPOSITION: # Rituxan today # in weekly x3 as planned #  in 3 weeks- MD; labs- cbc/cmp; L[facility]- Rituxan- Dr. Geraldene Kleine

## 2024-04-29 NOTE — Progress Notes (Signed)
 Was seen at [facility] 03/07/24 for constipation.

## 2024-04-29 NOTE — Patient Instructions (Signed)
 [facility] - A DEPT OF [facility]  Discharge Instructions: Thank you for choosing [facility] to provide your oncology and hematology care.  If you have a lab appointment with the Cancer Center, please go directly to the Cancer Center and check in at the registration area.  Wear comfortable clothing and clothing appropriate for easy access to any Portacath or PICC line.   We strive to give you quality time with your provider. You may need to reschedule your appointment if you arrive late (15 or more minutes).  Arriving late affects you and other patients whose appointments are after yours.  Also, if you miss three or more appointments without notifying the office, you may be dismissed from the clinic at the provider's discretion.      For prescription refill requests, have your pharmacy contact our office and allow 72 hours for refills to be completed.    Today you received the following chemotherapy and/or immunotherapy agents Rituxan.      To help prevent nausea and vomiting after your treatment, we encourage you to take your nausea medication as directed.  BELOW ARE SYMPTOMS THAT SHOULD BE REPORTED IMMEDIATELY: *FEVER GREATER THAN 100.4 F (38 C) OR HIGHER *CHILLS OR SWEATING *NAUSEA AND VOMITING THAT IS NOT CONTROLLED WITH YOUR NAUSEA MEDICATION *UNUSUAL SHORTNESS OF BREATH *UNUSUAL BRUISING OR BLEEDING *URINARY PROBLEMS (pain or burning when urinating, or frequent urination) *BOWEL PROBLEMS (unusual diarrhea, constipation, pain near the anus) TENDERNESS IN MOUTH AND THROAT WITH OR WITHOUT PRESENCE OF ULCERS (sore throat, sores in mouth, or a toothache) UNUSUAL RASH, SWELLING OR PAIN  UNUSUAL VAGINAL DISCHARGE OR ITCHING   Items with * indicate a potential emergency and should be followed up as soon as possible or go to the Emergency Department if any problems should occur.  Please show the CHEMOTHERAPY ALERT CARD or IMMUNOTHERAPY  ALERT CARD at check-in to the Emergency Department and triage nurse.  Should you have questions after your visit or need to cancel or reschedule your appointment, please contact [facility] - A DEPT OF [facility]  [phone] and follow the prompts.  Office hours are 8:00 a.m. to 4:30 p.m. Monday - Friday. Please note that voicemails left after 4:00 p.m. may not be returned until the following business day.  We are closed weekends and major holidays. You have access to a nurse at all times for urgent questions. Please call the main number to the clinic [phone] and follow the prompts.  For any non-urgent questions, you may also contact your provider using MyChart. We now offer e-Visits for anyone 67 and older to request care online for non-urgent symptoms. For details visit [email or web address].   Also download the MyChart app! Go to the app store, search "MyChart", open the app, select [place], and log in with your MyChart username and password.

## 2024-04-29 NOTE — Progress Notes (Signed)
 [facility] OFFICE PROGRESS NOTE  Patient Care Team: Sari Cunning, MD as PCP - General (Internal Medicine) Kandis Ormond, MD as Consulting Physician (Surgery) Cassie Click, MD as Consulting Physician (Gastroenterology) Gwyn Leos, MD as Consulting Physician (Internal Medicine)   Cancer Staging  No matching staging information was found for the patient.    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 Recm surviellance; Dr.Crystal.  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 With daughter- in-law- Melissa.    Troy Reynolds 73 y.o.  male pleasant patient above prior history of diffuse large B cell  lymphoma [2014] and history of follicular lymphoma G-1 [2017] currently on surveillance is here/for a follow up; and review the results of PET scan.  Patient has  intermittent constipation on laxatives.  Not any worse.  Otherwise no significant nausea or vomiting.  No fever no chills.  No weight loss.  Denies abdominal pain. Complains of mild nausea; but no vomiting or lumps or bumps.  Review of Systems  Constitutional:  Negative for chills, diaphoresis, fever and weight loss.  HENT:  Negative for nosebleeds and sore throat.   Eyes:  Negative for double vision.  Respiratory:  Negative for cough, hemoptysis, sputum production, shortness of breath and wheezing.   Cardiovascular:  Negative for chest pain, palpitations, orthopnea and leg swelling.  Gastrointestinal:  Positive for constipation. Negative for abdominal pain, blood in stool, diarrhea, heartburn, melena and vomiting.       Abdominal discomfort/bloating.  Genitourinary:  Negative for dysuria, frequency and urgency.  Musculoskeletal:  Negative for back pain and joint pain.  Skin: Negative.  Negative for itching and rash.  Neurological:  Negative for dizziness, tingling, focal weakness, weakness and headaches.  Endo/Heme/Allergies:  Does not bruise/bleed easily.  Psychiatric/Behavioral:  Negative for depression. The patient is not nervous/anxious and does not have insomnia.     PAST MEDICAL HISTORY :  Past Medical History:  Diagnosis Date   Actinic keratosis    Aortic atherosclerosis (HCC)    Arthritis    B12 deficiency    Bilateral inguinal hernia    BPH (benign prostatic hyperplasia)    Colon polyp, hyperplastic    Crohn's disease (HCC)    Diverticulosis    DLBCL (diffuse large B cell lymphoma) (HCC) 2014   Chemo tx's.   GERD (gastroesophageal reflux disease)    Hiatal hernia    HLD (hyperlipidemia)    Hypertension    Hypothyroid    Kidney stones  OSA on CPAP    Primary Parkinson's disease (HCC)    RBBB (right bundle branch block)    Renal cyst, left    Skin cancer    R jaw - treated by Dr. Teofilo Fellers many years ago - unsure which type   Thoracic aortic aneurysm (HCC)  07/17/2020   a.) CT AP 07/17/2020: 4.2 cm; b.) CT AP 07/17/2021: 4.1 cm; c.) CT AP 07/02/2022: 4.1 cm   TIA (transient ischemic attack)     PAST SURGICAL HISTORY :   Past Surgical History:  Procedure Laterality Date   BOWEL RESECTION N/A 06/19/2016   Procedure: SMALL BOWEL RESECTION;  Surgeon: Kandis Ormond, MD;  Location: [facility] ORS;  Service: General;  Laterality: N/A;   CHOLECYSTECTOMY     COLONOSCOPY     COLONOSCOPY WITH PROPOFOL  N/A 07/19/2021   Procedure: COLONOSCOPY WITH PROPOFOL ;  Surgeon: Shane Darling, MD;  Location: [facility] END[facility];  Service: Endoscopy;  Laterality: N/A;   ESOPHAGOGASTRODUODENOSCOPY     ESOPHAGOGASTRODUODENOSCOPY (EGD) WITH PROPOFOL  N/A 07/19/2021   Procedure: ESOPHAGOGASTRODUODENOSCOPY (EGD) WITH PROPOFOL ;  Surgeon: Shane Darling, MD;  Location: [facility] END[facility];  Service: Endoscopy;  Laterality: N/A;   LAPAROSCOPIC REMOVAL OF MESENTERIC MASS N/A 06/19/2016   Procedure: LAPAROSCOPIC REMOVAL OF MESENTERIC MASS;  Surgeon: Kandis Ormond, MD;  Location: [facility] OR[facility];  Service: General;  Laterality: N/A;   LYMPH NODE DISSECTION     PORT-A-CATH REMOVAL     PORTACATH PLACEMENT      FAMILY HISTORY :   Family History  Problem Relation Age of Onset   Clotting disorder Mother    Pulmonary embolism Mother    Lymphoma Father    Hiatal hernia Father    Heart disease Father        stent placed   Hypertension Father    Ulcers Father    Brain cancer Maternal Aunt    Cancer Paternal Uncle        esophagus    SOCIAL HISTORY:   Social History   Tobacco Use   Smoking status: Never   Smokeless tobacco: Never  Vaping Use   Vaping status: Never Used  Substance Use Topics   Alcohol use: No   Drug use: No    ALLERGIES:  has no known allergies.  MEDICATIONS:  Current Outpatient Medications  Medication Sig Dispense Refill   carbidopa-levodopa (SINEMET CR) 50-200 MG tablet Take 1 tablet by mouth 2 (two) times daily.     cyanocobalamin (VITAMIN  B12) 1000 MCG tablet Take 1,000 mcg by mouth daily.     levothyroxine (SYNTHROID) 75 MCG tablet 100 mcg.     losartan -hydrochlorothiazide  (HYZAAR) 50-12.5 MG per tablet TAKE 1 TABLET DAILY.     naproxen sodium (ALEVE) 220 MG tablet Take 220 mg by mouth daily as needed.     pantoprazole  (PROTONIX ) 40 MG tablet Take 40 mg by mouth daily.      simvastatin (ZOCOR) 20 MG tablet Take 1 tablet by mouth daily.     tamsulosin (FLOMAX) 0.4 MG CAPS capsule Take 0.4 mg by mouth daily.     No current facility-administered medications for this visit.   Facility-Administered Medications Ordered in Other Visits  Medication Dose Route Frequency Provider Last Rate Last Admin   0.9 %  sodium chloride  infusion   Intravenous Continuous Houston Zapien R, MD 10 mL/hr at 04/29/24 0950 New Bag at 04/29/24 0950   riTUXimab-pvvr (RUXIENCE) 800 mg in sodium chloride  0.9 % 250 mL (2.4242 mg/mL) infusion  375 mg/m2 (Order-Specific)  Intravenous Once Ethelene Closser R, MD        PHYSICAL EXAMINATION: ECOG PERFORMANCE STATUS: 1 - Symptomatic but completely ambulatory  BP 127/83 (BP Location: Left Arm, Patient Position: Sitting)   Pulse 81   Temp (!) 97 F (36.1 C) (Tympanic)   Resp 17   Ht 6' (1.829 m)   Wt 206 lb 6.4 oz (93.6 kg)   BMI 27.99 kg/m   Filed Weights   04/29/24 0839  Weight: 206 lb 6.4 oz (93.6 kg)    Physical Exam HENT:     Head: Normocephalic and atraumatic.     Mouth/Throat:     Pharynx: No oropharyngeal exudate.  Eyes:     Pupils: Pupils are equal, round, and reactive to light.  Cardiovascular:     Rate and Rhythm: Normal rate and regular rhythm.  Pulmonary:     Effort: No respiratory distress.     Breath sounds: No wheezing.  Abdominal:     General: Bowel sounds are normal. There is no distension.     Palpations: Abdomen is soft. There is no mass.     Tenderness: There is no abdominal tenderness. There is no guarding or rebound.     Comments: Midabdominal incision noted.   Well-healed.  Musculoskeletal:        General: No tenderness. Normal range of motion.     Cervical back: Normal range of motion and neck supple.  Skin:    General: Skin is warm.  Neurological:     Mental Status: He is alert and oriented to person, place, and time.  Psychiatric:        Mood and Affect: Affect normal.       LABORATORY DATA:  I have reviewed the data as listed    Component Value Date/Time   NA 138 04/29/2024 0812   NA 140 03/12/2015 1007   K 3.6 04/29/2024 0812   K 3.7 03/12/2015 1007   CL 106 04/29/2024 0812   CL 105 03/12/2015 1007   CO2 23 04/29/2024 0812   CO2 29 03/12/2015 1007   GLUCOSE 191 (H) 04/29/2024 0812   GLUCOSE 143 (H) 03/12/2015 1007   BUN 16 04/29/2024 0812   BUN 15 03/12/2015 1007   CREATININE 0.97 04/29/2024 0812   CREATININE 0.92 03/12/2015 1007   CALCIUM 8.5 (L) 04/29/2024 0812   CALCIUM 8.6 (L) 03/12/2015 1007   PROT 6.3 (L) 04/29/2024 0812   PROT 6.6 03/12/2015 1007   ALBUMIN 3.9 04/29/2024 0812   ALBUMIN 4.0 03/12/2015 1007   AST 24 04/29/2024 0812   ALT 10 04/29/2024 0812   ALT 37 03/12/2015 1007   ALKPHOS 71 04/29/2024 0812   ALKPHOS 79 03/12/2015 1007   BILITOT 0.9 04/29/2024 0812   GFRNONAA >60 04/29/2024 0812   GFRNONAA >60 03/12/2015 1007   GFRAA >60 07/19/2020 1011   GFRAA >60 03/12/2015 1007    No results found for: "SPEP", "UPEP"  Lab Results  Component Value Date   WBC 4.9 04/29/2024   NEUTROABS 3.3 04/29/2024   HGB 16.5 04/29/2024   HCT 49.0 04/29/2024   MCV 87.0 04/29/2024   PLT 202 04/29/2024      Chemistry      Component Value Date/Time   NA 138 04/29/2024 0812   NA 140 03/12/2015 1007   K 3.6 04/29/2024 0812   K 3.7 03/12/2015 1007   CL 106 04/29/2024 0812   CL 105 03/12/2015 1007   CO2 23 04/29/2024 0812   CO2 29 03/12/2015 1007  BUN 16 04/29/2024 0812   BUN 15 03/12/2015 1007   CREATININE 0.97 04/29/2024 0812   CREATININE 0.92 03/12/2015 1007      Component Value Date/Time   CALCIUM  8.5 (L) 04/29/2024 0812   CALCIUM 8.6 (L) 03/12/2015 1007   ALKPHOS 71 04/29/2024 0812   ALKPHOS 79 03/12/2015 1007   AST 24 04/29/2024 0812   ALT 10 04/29/2024 0812   ALT 37 03/12/2015 1007   BILITOT 0.9 04/29/2024 0812       RADIOGRAPHIC STUDIES: I have personally reviewed the radiological images as listed and agreed with the findings in the report. No results found.   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  # FEB PET scan- 2025- Progressive hypermetabolic abdominal and pelvic adenopathy, measuring up into the low Deauville 5 range.No splenomegaly or focal hypermetabolic splenic activity.   # given the progressive lymphadenopathy concerning for progression of his follicle lymphoma-recommend proceeding with weekly rituxan weekly x4.   # I again discussed mechanism of action of rituximab;and also potential side effects including but not limited to infusion reactions;rare infections/activation including hepatitis. hepatitis work-up NEGATIVE.   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  # Reflux- on PPI- ? Nausea- ? Etiology- denies anti-emetics.   # Parkinson- mild- carbi-dopa-love-dopa- [Dr.Shah]- stable   # COnstipation- recommend Miralax BID; add colace [? 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 stable   # Prostate enlargement- [on Flomax]- recommend evaluation with urology. Defer to PCP.  # IV access: PIV  my chart appt Q 54m  # DISPOSITION: # Rituxan today # in weekly x3 as planned #  in 3 weeks- MD; labs- cbc/cmp; LDH- Rituxan- Dr. Geraldene Kleine        Orders Placed This Encounter  Procedures   CMP (Cancer Center only)    Standing Status:   Future     Expected Date:   05/20/2024    Expiration Date:   04/29/2025   CBC with Differential (Cancer Center Only)    Standing Status:   Future    Expected Date:   05/20/2024    Expiration Date:   04/29/2025   Lactate dehydrogenase    Standing Status:   Future    Expected Date:   05/20/2024    Expiration Date:   04/29/2025   All questions were answered. The patient knows to call the clinic with any problems, questions or concerns.      Gwyn Leos, MD 04/29/2024 10:12 AM

## 2024-04-29 NOTE — Progress Notes (Signed)
 Pt having issues with constipation d/t his carbo/dopa. Instructed him to slowly increase Miralax. Appetite is good. Energy is good.Went over side effects of rituxan with him.

## 2024-05-02 NOTE — Progress Notes (Signed)
 Rapid Infusion Rituximab  Pharmacist Evaluation  Troy Reynolds is a 73 y.o. male being treated with rituximab  for Non-Hodgkins Lymphoma. This patient may be considered for RIR.   A pharmacist has verified the patient tolerated rituximab  infusions per the [facility] standard infusion protocol without grade 3-4 infusion reactions. The treatment plan will be updated to reflect RIR if the patient qualifies per the checklist below:   Age > 91 years old Yes   Clinically significant cardiovascular disease No   Circulating lymphocyte count < 5000/uL prior to cycle two Yes  Lab Results  Component Value Date   LYMPHSABS 0.9 04/29/2024    Prior documented grade 3-4 infusion reaction to rituximab  No   Prior documented grade 1-2 infusion reaction to rituximab  (If YES, Pharmacist will confirm with Physician if patient is still a candidate for RIR) No   Previous rituximab  infusion within the past 6 months Yes   Treatment Plan updated orders to reflect RIR Yes    [name] does meet the criteria for Rapid Infusion Rituximab . This patient is going to be switched to rapid infusion rituximab .   Glendora Landsman, PharmD, BCPS Clinical Pharmacist   05/02/24 3:56 PM

## 2024-05-06 ENCOUNTER — Ambulatory Visit: Admitting: Internal Medicine

## 2024-05-06 ENCOUNTER — Inpatient Hospital Stay

## 2024-05-06 ENCOUNTER — Ambulatory Visit

## 2024-05-06 ENCOUNTER — Other Ambulatory Visit

## 2024-05-06 VITALS — BP 118/78 | HR 58 | Temp 97.0°F | Resp 17

## 2024-05-06 DIAGNOSIS — Z5111 Encounter for antineoplastic chemotherapy: Secondary | ICD-10-CM | POA: Diagnosis not present

## 2024-05-06 DIAGNOSIS — C8203 Follicular lymphoma grade I, intra-abdominal lymph nodes: Secondary | ICD-10-CM

## 2024-05-06 MED ORDER — SODIUM CHLORIDE 0.9 % IV SOLN
INTRAVENOUS | Status: DC
  Filled 2024-05-06: qty 250

## 2024-05-06 MED ORDER — DIPHENHYDRAMINE HCL 25 MG PO CAPS
50.0000 mg | ORAL_CAPSULE | Freq: Once | ORAL | Status: AC
  Administered 2024-05-06: 50 mg via ORAL
  Filled 2024-05-06: qty 2

## 2024-05-06 MED ORDER — SODIUM CHLORIDE 0.9 % IV SOLN
375.0000 mg/m2 | Freq: Once | INTRAVENOUS | Status: AC
  Administered 2024-05-06: 800 mg via INTRAVENOUS
  Filled 2024-05-06: qty 80
  Filled 2024-05-06: qty 30

## 2024-05-06 MED ORDER — ACETAMINOPHEN 325 MG PO TABS
650.0000 mg | ORAL_TABLET | Freq: Once | ORAL | Status: AC
  Administered 2024-05-06: 650 mg via ORAL
  Filled 2024-05-06: qty 2

## 2024-05-06 NOTE — Progress Notes (Signed)
 Patient tolerated Ruxience  with no complaints voiced. Side effects with management reviewed with understanding verbalized.  IV site clean and dry with no bruising or swelling noted at site.  Good blood return noted before and after administration of chemotherapy.  Coban wrap applied.  Patient left in satisfactory condition with VSS and no s/s of distress noted. All follow ups as scheduled.  Troy Reynolds [employer]

## 2024-05-06 NOTE — Patient Instructions (Signed)
 [facility] - A DEPT OF [facility]  Discharge Instructions: Thank you for choosing [facility] to provide your oncology and hematology care.  If you have a lab appointment with the Cancer Center, please go directly to the Cancer Center and check in at the registration area.  Wear comfortable clothing and clothing appropriate for easy access to any Portacath or PICC line.   We strive to give you quality time with your provider. You may need to reschedule your appointment if you arrive late (15 or more minutes).  Arriving late affects you and other patients whose appointments are after yours.  Also, if you miss three or more appointments without notifying the office, you may be dismissed from the clinic at the provider's discretion.      For prescription refill requests, have your pharmacy contact our office and allow 72 hours for refills to be completed.    Today you received the following chemotherapy and/or immunotherapy agents Rituximab      To help prevent nausea and vomiting after your treatment, we encourage you to take your nausea medication as directed.  BELOW ARE SYMPTOMS THAT SHOULD BE REPORTED IMMEDIATELY: *FEVER GREATER THAN 100.4 F (38 C) OR HIGHER *CHILLS OR SWEATING *NAUSEA AND VOMITING THAT IS NOT CONTROLLED WITH YOUR NAUSEA MEDICATION *UNUSUAL SHORTNESS OF BREATH *UNUSUAL BRUISING OR BLEEDING *URINARY PROBLEMS (pain or burning when urinating, or frequent urination) *BOWEL PROBLEMS (unusual diarrhea, constipation, pain near the anus) TENDERNESS IN MOUTH AND THROAT WITH OR WITHOUT PRESENCE OF ULCERS (sore throat, sores in mouth, or a toothache) UNUSUAL RASH, SWELLING OR PAIN  UNUSUAL VAGINAL DISCHARGE OR ITCHING   Items with * indicate a potential emergency and should be followed up as soon as possible or go to the Emergency Department if any problems should occur.  Please show the CHEMOTHERAPY ALERT CARD or IMMUNOTHERAPY  ALERT CARD at check-in to the Emergency Department and triage nurse.  Should you have questions after your visit or need to cancel or reschedule your appointment, please contact [facility] - A DEPT OF [facility]  [phone] and follow the prompts.  Office hours are 8:00 a.m. to 4:30 p.m. Monday - Friday. Please note that voicemails left after 4:00 p.m. may not be returned until the following business day.  We are closed weekends and major holidays. You have access to a nurse at all times for urgent questions. Please call the main number to the clinic [phone] and follow the prompts.  For any non-urgent questions, you may also contact your provider using MyChart. We now offer e-Visits for anyone 63 and older to request care online for non-urgent symptoms. For details visit [email or web address].   Also download the MyChart app! Go to the app store, search "MyChart", open the app, select [facility], and log in with your MyChart username and password.

## 2024-05-13 ENCOUNTER — Ambulatory Visit: Admitting: Nurse Practitioner

## 2024-05-13 ENCOUNTER — Other Ambulatory Visit

## 2024-05-13 ENCOUNTER — Inpatient Hospital Stay

## 2024-05-13 ENCOUNTER — Ambulatory Visit

## 2024-05-13 VITALS — BP 139/79 | HR 79 | Temp 97.5°F | Resp 18 | Wt 205.7 lb

## 2024-05-13 DIAGNOSIS — Z5111 Encounter for antineoplastic chemotherapy: Secondary | ICD-10-CM | POA: Diagnosis not present

## 2024-05-13 DIAGNOSIS — C8203 Follicular lymphoma grade I, intra-abdominal lymph nodes: Secondary | ICD-10-CM

## 2024-05-13 MED ORDER — SODIUM CHLORIDE 0.9 % IV SOLN
375.0000 mg/m2 | Freq: Once | INTRAVENOUS | Status: AC
  Administered 2024-05-13: 800 mg via INTRAVENOUS
  Filled 2024-05-13: qty 30

## 2024-05-13 MED ORDER — DIPHENHYDRAMINE HCL 25 MG PO CAPS
50.0000 mg | ORAL_CAPSULE | Freq: Once | ORAL | Status: AC
Start: 2024-05-13 — End: 2024-05-13
  Administered 2024-05-13: 50 mg via ORAL
  Filled 2024-05-13: qty 2

## 2024-05-13 MED ORDER — SODIUM CHLORIDE 0.9 % IV SOLN
INTRAVENOUS | Status: DC
  Filled 2024-05-13: qty 250

## 2024-05-13 MED ORDER — ACETAMINOPHEN 325 MG PO TABS
650.0000 mg | ORAL_TABLET | Freq: Once | ORAL | Status: AC
  Administered 2024-05-13: 650 mg via ORAL
  Filled 2024-05-13: qty 2

## 2024-05-13 NOTE — Patient Instructions (Signed)
 [facility] - A DEPT OF [facility]  Discharge Instructions: Thank you for choosing [facility] to provide your oncology and hematology care.  If you have a lab appointment with the Cancer Center, please go directly to the Cancer Center and check in at the registration area.  Wear comfortable clothing and clothing appropriate for easy access to any Portacath or PICC line.   We strive to give you quality time with your provider. You may need to reschedule your appointment if you arrive late (15 or more minutes).  Arriving late affects you and other patients whose appointments are after yours.  Also, if you miss three or more appointments without notifying the office, you may be dismissed from the clinic at the provider's discretion.      For prescription refill requests, have your pharmacy contact our office and allow 72 hours for refills to be completed.    Today you received the following chemotherapy and/or immunotherapy agents Rituxan.      To help prevent nausea and vomiting after your treatment, we encourage you to take your nausea medication as directed.  BELOW ARE SYMPTOMS THAT SHOULD BE REPORTED IMMEDIATELY: *FEVER GREATER THAN 100.4 F (38 C) OR HIGHER *CHILLS OR SWEATING *NAUSEA AND VOMITING THAT IS NOT CONTROLLED WITH YOUR NAUSEA MEDICATION *UNUSUAL SHORTNESS OF BREATH *UNUSUAL BRUISING OR BLEEDING *URINARY PROBLEMS (pain or burning when urinating, or frequent urination) *BOWEL PROBLEMS (unusual diarrhea, constipation, pain near the anus) TENDERNESS IN MOUTH AND THROAT WITH OR WITHOUT PRESENCE OF ULCERS (sore throat, sores in mouth, or a toothache) UNUSUAL RASH, SWELLING OR PAIN  UNUSUAL VAGINAL DISCHARGE OR ITCHING   Items with * indicate a potential emergency and should be followed up as soon as possible or go to the Emergency Department if any problems should occur.  Please show the CHEMOTHERAPY ALERT CARD or IMMUNOTHERAPY  ALERT CARD at check-in to the Emergency Department and triage nurse.  Should you have questions after your visit or need to cancel or reschedule your appointment, please contact [facility] - A DEPT OF [facility]  [phone] and follow the prompts.  Office hours are 8:00 a.m. to 4:30 p.m. Monday - Friday. Please note that voicemails left after 4:00 p.m. may not be returned until the following business day.  We are closed weekends and major holidays. You have access to a nurse at all times for urgent questions. Please call the main number to the clinic [phone] and follow the prompts.  For any non-urgent questions, you may also contact your provider using MyChart. We now offer e-Visits for anyone 67 and older to request care online for non-urgent symptoms. For details visit [email or web address].   Also download the MyChart app! Go to the app store, search "MyChart", open the app, select [place], and log in with your MyChart username and password.

## 2024-05-20 ENCOUNTER — Other Ambulatory Visit

## 2024-05-20 ENCOUNTER — Ambulatory Visit

## 2024-05-20 ENCOUNTER — Encounter: Payer: Self-pay | Admitting: Internal Medicine

## 2024-05-20 ENCOUNTER — Ambulatory Visit: Admitting: Internal Medicine

## 2024-05-20 ENCOUNTER — Other Ambulatory Visit: Payer: Self-pay

## 2024-05-20 ENCOUNTER — Inpatient Hospital Stay

## 2024-05-20 ENCOUNTER — Inpatient Hospital Stay (HOSPITAL_BASED_OUTPATIENT_CLINIC_OR_DEPARTMENT_OTHER): Admitting: Internal Medicine

## 2024-05-20 VITALS — BP 136/91 | HR 73 | Temp 97.6°F | Resp 18 | Ht 72.0 in | Wt 203.7 lb

## 2024-05-20 VITALS — BP 113/75 | HR 62 | Temp 96.9°F | Resp 16

## 2024-05-20 DIAGNOSIS — C8203 Follicular lymphoma grade I, intra-abdominal lymph nodes: Secondary | ICD-10-CM | POA: Diagnosis not present

## 2024-05-20 DIAGNOSIS — Z5111 Encounter for antineoplastic chemotherapy: Secondary | ICD-10-CM | POA: Diagnosis not present

## 2024-05-20 LAB — CMP (CANCER CENTER ONLY)
ALT: 11 U/L (ref 0–44)
AST: 21 U/L (ref 15–41)
Albumin: 4.1 g/dL (ref 3.5–5.0)
Alkaline Phosphatase: 66 U/L (ref 38–126)
Anion gap: 7 (ref 5–15)
BUN: 17 mg/dL (ref 8–23)
CO2: 26 mmol/L (ref 22–32)
Calcium: 8.5 mg/dL — ABNORMAL LOW (ref 8.9–10.3)
Chloride: 104 mmol/L (ref 98–111)
Creatinine: 1.05 mg/dL (ref 0.61–1.24)
GFR, Estimated: >60 mL/min (ref >60)
Glucose, Bld: 97 mg/dL (ref 70–99)
Potassium: 3.8 mmol/L (ref 3.5–5.1)
Sodium: 137 mmol/L (ref 135–145)
Total Bilirubin: 1.2 mg/dL (ref 0.0–1.2)
Total Protein: 6.5 g/dL (ref 6.5–8.1)

## 2024-05-20 LAB — CBC WITH DIFFERENTIAL (CANCER CENTER ONLY)
Abs Immature Granulocytes: 0.05 10*3/uL (ref 0.00–0.07)
Basophils Absolute: 0.1 10*3/uL (ref 0.0–0.1)
Basophils Relative: 2 %
Eosinophils Absolute: 0.1 10*3/uL (ref 0.0–0.5)
Eosinophils Relative: 3 %
HCT: 47.7 % (ref 39.0–52.0)
Hemoglobin: 16 g/dL (ref 13.0–17.0)
Immature Granulocytes: 1 %
Lymphocytes Relative: 17 %
Lymphs Abs: 0.7 10*3/uL (ref 0.7–4.0)
MCH: 28.9 pg (ref 26.0–34.0)
MCHC: 33.5 g/dL (ref 30.0–36.0)
MCV: 86.3 fL (ref 80.0–100.0)
Monocytes Absolute: 0.6 10*3/uL (ref 0.1–1.0)
Monocytes Relative: 14 %
Neutro Abs: 2.7 10*3/uL (ref 1.7–7.7)
Neutrophils Relative %: 63 %
Platelet Count: 217 10*3/uL (ref 150–400)
RBC: 5.53 MIL/uL (ref 4.22–5.81)
RDW: 13.5 % (ref 11.5–15.5)
WBC Count: 4.3 10*3/uL (ref 4.0–10.5)
nRBC: 0 % (ref 0.0–0.2)

## 2024-05-20 LAB — LACTATE DEHYDROGENASE: LDH: 134 U/L (ref 98–192)

## 2024-05-20 MED ORDER — ACETAMINOPHEN 325 MG PO TABS
650.0000 mg | ORAL_TABLET | Freq: Once | ORAL | Status: AC
Start: 2024-05-20 — End: 2024-05-20
  Administered 2024-05-20: 650 mg via ORAL
  Filled 2024-05-20: qty 2

## 2024-05-20 MED ORDER — SODIUM CHLORIDE 0.9 % IV SOLN
375.0000 mg/m2 | Freq: Once | INTRAVENOUS | Status: AC
  Administered 2024-05-20: 800 mg via INTRAVENOUS
  Filled 2024-05-20: qty 30

## 2024-05-20 MED ORDER — SODIUM CHLORIDE 0.9 % IV SOLN
INTRAVENOUS | Status: DC
Start: 2024-05-20 — End: 2024-05-20
  Filled 2024-05-20: qty 250

## 2024-05-20 MED ORDER — DIPHENHYDRAMINE HCL 25 MG PO CAPS
50.0000 mg | ORAL_CAPSULE | Freq: Once | ORAL | Status: AC
  Administered 2024-05-20: 50 mg via ORAL
  Filled 2024-05-20: qty 2

## 2024-05-20 NOTE — Progress Notes (Signed)
 [facility] OFFICE PROGRESS NOTE  Patient Care Team: Cleotilde Oneil FALCON, MD as PCP - General (Internal Medicine) Fleeta Dorothyann CROME, MD as Consulting Physician (Surgery) Aundria Ladell POUR, MD as Consulting Physician (Gastroenterology) Rennie Troy SAUNDERS, MD as Consulting Physician (Internal Medicine)   Cancer Staging  No matching staging information was found for the patient.    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 Recm surviellance; Dr.Crystal.  # July-AUG 2020-PET scan-recurrent/progression of follicular lymphoma-asymptomatic continue surveillance   # #progressive lymphadenopathy concerning for progression of his follicle lymphoma-recommend proceeding with weekly rituxan  weekly x4. 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 With son.    Troy Reynolds 73 y.o.  male pleasant patient above prior history of diffuse large B cell  lymphoma [2014] and history of follicular lymphoma G-1 [2017] - with progressive follicular  lymphoma on rituxan  weekly is here for a follow up.  Tolerating well. No infusion reactions noted. Otherwise no significant nausea or vomiting.  No fever no chills.  No weight loss.  Denies abdominal pain.   Review of Systems  Constitutional:  Negative for chills, diaphoresis, fever and weight loss.  HENT:  Negative for nosebleeds and sore throat.   Eyes:  Negative for double vision.  Respiratory:  Negative for cough, hemoptysis, sputum production, shortness of breath and wheezing.   Cardiovascular:  Negative for chest pain, palpitations, orthopnea and leg swelling.  Gastrointestinal:  Positive for constipation. Negative for abdominal pain, blood in stool, diarrhea, heartburn, melena and vomiting.       Abdominal discomfort/bloating.  Genitourinary:  Negative for dysuria, frequency and urgency.  Musculoskeletal:  Negative for back pain and joint pain.  Skin: Negative.  Negative for itching and rash.  Neurological:  Negative for dizziness, tingling, focal weakness, weakness and headaches.  Endo/Heme/Allergies:  Does not bruise/bleed easily.  Psychiatric/Behavioral:  Negative for depression. The patient is not nervous/anxious and does not have insomnia.     PAST MEDICAL HISTORY :  Past Medical History:  Diagnosis Date   Actinic keratosis    Aortic atherosclerosis (HCC)    Arthritis    B12 deficiency    Bilateral inguinal hernia    BPH (benign prostatic hyperplasia)    Colon polyp, hyperplastic    Crohn's disease (HCC)    Diverticulosis    DLBCL (diffuse large B cell lymphoma) (HCC) 2014   Chemo tx's.   GERD (gastroesophageal reflux disease)    Hiatal hernia    HLD (hyperlipidemia)    Hypertension    Hypothyroid    Kidney stones    OSA on CPAP    Primary Parkinson's  disease (HCC)    RBBB (right bundle branch block)    Renal cyst, left    Skin cancer    R jaw - treated by Dr. Charlott many years ago - unsure which type   Thoracic aortic aneurysm (HCC) 07/17/2020   a.) CT  AP 07/17/2020: 4.2 cm; b.) CT AP 07/17/2021: 4.1 cm; c.) CT AP 07/02/2022: 4.1 cm   TIA (transient ischemic attack)     PAST SURGICAL HISTORY :   Past Surgical History:  Procedure Laterality Date   BOWEL RESECTION N/A 06/19/2016   Procedure: SMALL BOWEL RESECTION;  Surgeon: Dorothyann LITTIE Husk, MD;  Location: [facility] OR[facility];  Service: General;  Laterality: N/A;   CHOLECYSTECTOMY     COLONOSCOPY     COLONOSCOPY WITH PROPOFOL  N/A 07/19/2021   Procedure: COLONOSCOPY WITH PROPOFOL ;  Surgeon: Maryruth Ole DASEN, MD;  Location: [facility];  Service: Endoscopy;  Laterality: N/A;   ESOPHAGOGASTRODUODENOSCOPY     ESOPHAGOGASTRODUODENOSCOPY (EGD) WITH PROPOFOL  N/A 07/19/2021   Procedure: ESOPHAGOGASTRODUODENOSCOPY (EGD) WITH PROPOFOL ;  Surgeon: Maryruth Ole DASEN, MD;  Location: [facility];  Service: Endoscopy;  Laterality: N/A;   LAPAROSCOPIC REMOVAL OF MESENTERIC MASS N/A 06/19/2016   Procedure: LAPAROSCOPIC REMOVAL OF MESENTERIC MASS;  Surgeon: Dorothyann LITTIE Husk, MD;  Location: [facility];  Service: General;  Laterality: N/A;   LYMPH NODE DISSECTION     PORT-A-CATH REMOVAL     PORTACATH PLACEMENT      FAMILY HISTORY :   Family History  Problem Relation Age of Onset   Clotting disorder Mother    Pulmonary embolism Mother    Lymphoma Father    Hiatal hernia Father    Heart disease Father        stent placed   Hypertension Father    Ulcers Father    Brain cancer Maternal Aunt    Cancer Paternal Uncle        esophagus    SOCIAL HISTORY:   Social History   Tobacco Use   Smoking status: Never   Smokeless tobacco: Never  Vaping Use   Vaping status: Never Used  Substance Use Topics   Alcohol use: No   Drug use: No    ALLERGIES:  has no known allergies.  MEDICATIONS:  Current Outpatient Medications  Medication Sig Dispense Refill   carbidopa-levodopa (SINEMET CR) 50-200 MG tablet Take 1 tablet by mouth 2 (two) times daily.     cyanocobalamin (VITAMIN B12) 1000 MCG tablet  Take 1,000 mcg by mouth daily.     levothyroxine (SYNTHROID) 75 MCG tablet 100 mcg.     losartan -hydrochlorothiazide  (HYZAAR) 50-12.5 MG per tablet TAKE 1 TABLET DAILY.     naproxen sodium (ALEVE) 220 MG tablet Take 220 mg by mouth daily as needed.     pantoprazole  (PROTONIX ) 40 MG tablet Take 40 mg by mouth daily.      simvastatin (ZOCOR) 20 MG tablet Take 1 tablet by mouth daily.     tamsulosin (FLOMAX) 0.4 MG CAPS capsule Take 0.4 mg by mouth daily.     No current facility-administered medications for this visit.   Facility-Administered Medications Ordered in Other Visits  Medication Dose Route Frequency Provider Last Rate Last Admin   0.9 %  sodium chloride  infusion   Intravenous Continuous Brittanya Winburn R, MD 10 mL/hr at 05/20/24 0945 New Bag at 05/20/24 0945   riTUXimab -pvvr (RUXIENCE ) 800 mg in sodium chloride  0.9 % 170 mL infusion  375 mg/m2 (Order-Specific) Intravenous Once Atreyu Mak R, MD  PHYSICAL EXAMINATION: ECOG PERFORMANCE STATUS: 1 - Symptomatic but completely ambulatory  BP (!) 136/91   Pulse 73   Temp 97.6 F (36.4 C) (Tympanic)   Resp 18   Ht 6' (1.829 m)   Wt 203 lb 11.2 oz (92.4 kg)   BMI 27.63 kg/m   Filed Weights   05/20/24 0835  Weight: 203 lb 11.2 oz (92.4 kg)    Physical Exam HENT:     Head: Normocephalic and atraumatic.     Mouth/Throat:     Pharynx: No oropharyngeal exudate.   Eyes:     Pupils: Pupils are equal, round, and reactive to light.    Cardiovascular:     Rate and Rhythm: Normal rate and regular rhythm.  Pulmonary:     Effort: No respiratory distress.     Breath sounds: No wheezing.  Abdominal:     General: Bowel sounds are normal. There is no distension.     Palpations: Abdomen is soft. There is no mass.     Tenderness: There is no abdominal tenderness. There is no guarding or rebound.     Comments: Midabdominal incision noted.  Well-healed.   Musculoskeletal:        General: No tenderness. Normal  range of motion.     Cervical back: Normal range of motion and neck supple.   Skin:    General: Skin is warm.   Neurological:     Mental Status: He is alert and oriented to person, place, and time.   Psychiatric:        Mood and Affect: Affect normal.       LABORATORY DATA:  I have reviewed the data as listed    Component Value Date/Time   NA 137 05/20/2024 0832   NA 140 03/12/2015 1007   K 3.8 05/20/2024 0832   K 3.7 03/12/2015 1007   CL 104 05/20/2024 0832   CL 105 03/12/2015 1007   CO2 26 05/20/2024 0832   CO2 29 03/12/2015 1007   GLUCOSE 97 05/20/2024 0832   GLUCOSE 143 (H) 03/12/2015 1007   BUN 17 05/20/2024 0832   BUN 15 03/12/2015 1007   CREATININE 1.05 05/20/2024 0832   CREATININE 0.92 03/12/2015 1007   CALCIUM 8.5 (L) 05/20/2024 0832   CALCIUM 8.6 (L) 03/12/2015 1007   PROT 6.5 05/20/2024 0832   PROT 6.6 03/12/2015 1007   ALBUMIN 4.1 05/20/2024 0832   ALBUMIN 4.0 03/12/2015 1007   AST 21 05/20/2024 0832   ALT 11 05/20/2024 0832   ALT 37 03/12/2015 1007   ALKPHOS 66 05/20/2024 0832   ALKPHOS 79 03/12/2015 1007   BILITOT 1.2 05/20/2024 0832   GFRNONAA >60 05/20/2024 0832   GFRNONAA >60 03/12/2015 1007   GFRAA >60 07/19/2020 1011   GFRAA >60 03/12/2015 1007    No results found for: SPEP, UPEP  Lab Results  Component Value Date   WBC 4.3 05/20/2024   NEUTROABS 2.7 05/20/2024   HGB 16.0 05/20/2024   HCT 47.7 05/20/2024   MCV 86.3 05/20/2024   PLT 217 05/20/2024      Chemistry      Component Value Date/Time   NA 137 05/20/2024 0832   NA 140 03/12/2015 1007   K 3.8 05/20/2024 0832   K 3.7 03/12/2015 1007   CL 104 05/20/2024 0832   CL 105 03/12/2015 1007   CO2 26 05/20/2024 0832   CO2 29 03/12/2015 1007   BUN 17 05/20/2024 0832   BUN 15 03/12/2015 1007   CREATININE  1.05 05/20/2024 0832   CREATININE 0.92 03/12/2015 1007      Component Value Date/Time   CALCIUM 8.5 (L) 05/20/2024 0832   CALCIUM 8.6 (L) 03/12/2015 1007   ALKPHOS 66  05/20/2024 0832   ALKPHOS 79 03/12/2015 1007   AST 21 05/20/2024 0832   ALT 11 05/20/2024 0832   ALT 37 03/12/2015 1007   BILITOT 1.2 05/20/2024 0832       RADIOGRAPHIC STUDIES: I have personally reviewed the radiological images as listed and agreed with the findings in the report. No results found.   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  # FEB PET scan- 2025- Progressive hypermetabolic abdominal and pelvic adenopathy, measuring up into the low Deauville 5 range.No splenomegaly or focal hypermetabolic splenic activity. JUNE 2025-  weekly rituxan  weekly x4. hepatitis work-up NEGATIVE.    # proceed with Rituxan  weekly- #4 weekly Traitement today. Tolerating well will plan PET scan in 3 months. Ordered today.   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 Stable.   # Reflux- on PPI- ? Nausea- ? Etiology- denies anti-emetics.   # Parkinson- mild- carbi-dopa-love-dopa- [Dr.Shah]- stable   # COnstipation- recommend Miralax BID; add colace [? 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 stable   # Prostate enlargement- [on Flomax]- recommend evaluation with urology. Defer to PCP.  # IV access: PIV  my chart appt Q 33m  # DISPOSITION: Rituxan  today #  in 12 weeks- MD; labs- cbc/cmp; LDH-; PET scan- Dr. KATHEE    Orders Placed This Encounter  Procedures   NM PET Image Restage (PS) Skull Base to Thigh (F-18 FDG)    Standing Status:   Future    Expected Date:   08/12/2024    Expiration Date:   05/20/2025    If indicated for the ordered procedure, I authorize the administration of a radiopharmaceutical per Radiology protocol:   Yes    Preferred imaging location?:   [facility]   CBC with Differential (Cancer Center Only)    Standing Status:    Future    Expected Date:   08/12/2024    Expiration Date:   05/20/2025   CMP (Cancer Center only)    Standing Status:   Future    Expected Date:   08/12/2024    Expiration Date:   05/20/2025   Lactate dehydrogenase    Standing Status:   Future    Expected Date:   08/12/2024    Expiration Date:   05/20/2025   All questions were answered. The patient knows to call the clinic with any problems, questions or concerns.      Troy JONELLE Joe, MD 05/20/2024 9:48 AM

## 2024-05-20 NOTE — Assessment & Plan Note (Addendum)
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  # FEB PET scan- 2025- Progressive hypermetabolic abdominal and pelvic adenopathy, measuring up into the low Deauville 5 range.No splenomegaly or focal hypermetabolic splenic activity. JUNE 2025-  weekly rituxan  weekly x4. hepatitis work-up NEGATIVE.    # proceed with Rituxan  weekly- #4 weekly Traitement today. Tolerating well will plan PET scan in 3 months. Ordered today.   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 Stable.   # Reflux- on PPI- ? Nausea- ? Etiology- denies anti-emetics.   # Parkinson- mild- carbi-dopa-love-dopa- [Dr.Shah]- stable   # COnstipation- recommend Miralax BID; add colace [? 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 stable   # Prostate enlargement- [on Flomax]- recommend evaluation with urology. Defer to PCP.  # IV access: PIV  my chart appt Q 15m  # DISPOSITION: Rituxan  today #  in 12 weeks- MD; labs- cbc/cmp; LDH-; PET scan- Dr. KATHEE

## 2024-05-21 ENCOUNTER — Other Ambulatory Visit: Payer: Self-pay

## 2024-05-30 ENCOUNTER — Ambulatory Visit: Admitting: Dermatology

## 2024-05-30 DIAGNOSIS — D1801 Hemangioma of skin and subcutaneous tissue: Secondary | ICD-10-CM

## 2024-05-30 DIAGNOSIS — D229 Melanocytic nevi, unspecified: Secondary | ICD-10-CM

## 2024-05-30 DIAGNOSIS — Z1283 Encounter for screening for malignant neoplasm of skin: Secondary | ICD-10-CM | POA: Diagnosis not present

## 2024-05-30 DIAGNOSIS — Z7189 Other specified counseling: Secondary | ICD-10-CM

## 2024-05-30 DIAGNOSIS — I781 Nevus, non-neoplastic: Secondary | ICD-10-CM | POA: Diagnosis not present

## 2024-05-30 DIAGNOSIS — W908XXA Exposure to other nonionizing radiation, initial encounter: Secondary | ICD-10-CM

## 2024-05-30 DIAGNOSIS — L570 Actinic keratosis: Secondary | ICD-10-CM | POA: Diagnosis not present

## 2024-05-30 DIAGNOSIS — L578 Other skin changes due to chronic exposure to nonionizing radiation: Secondary | ICD-10-CM | POA: Diagnosis not present

## 2024-05-30 DIAGNOSIS — Z5111 Encounter for antineoplastic chemotherapy: Secondary | ICD-10-CM

## 2024-05-30 DIAGNOSIS — L821 Other seborrheic keratosis: Secondary | ICD-10-CM

## 2024-05-30 DIAGNOSIS — Z872 Personal history of diseases of the skin and subcutaneous tissue: Secondary | ICD-10-CM

## 2024-05-30 DIAGNOSIS — L814 Other melanin hyperpigmentation: Secondary | ICD-10-CM

## 2024-05-30 DIAGNOSIS — L820 Inflamed seborrheic keratosis: Secondary | ICD-10-CM | POA: Diagnosis not present

## 2024-05-30 DIAGNOSIS — Z79899 Other long term (current) drug therapy: Secondary | ICD-10-CM

## 2024-05-30 MED ORDER — FLUOROURACIL 5 % EX CREA: TOPICAL_CREAM | CUTANEOUS | 2 refills | Status: DC

## 2024-05-30 NOTE — Progress Notes (Unsigned)
 Follow-Up Visit   Subjective  Troy Reynolds is a 73 y.o. male who presents for the following: Skin Cancer Screening and Full Body Skin Exam, hx of precancers  The patient presents for Total-Body Skin Exam (TBSE) for skin cancer screening and mole check. The patient has spots, moles and lesions to be evaluated, some may be new or changing and the patient may have concern these could be cancer.  The following portions of the chart were reviewed this encounter and updated as appropriate: medications, allergies, medical history  Review of Systems:  No other skin or systemic complaints except as noted in HPI or Assessment and Plan.  Objective  Well appearing patient in no apparent distress; mood and affect are within normal limits.  A full examination was performed including scalp, head, eyes, ears, nose, lips, neck, chest, axillae, abdomen, back, buttocks, bilateral upper extremities, bilateral lower extremities, hands, feet, fingers, toes, fingernails, and toenails. All findings within normal limits unless otherwise noted below.   Relevant physical exam findings are noted in the Assessment and Plan.  left temple x 2, left deltoid x 1 (3) Stuck-on, waxy, tan-brown papules and plaques -- Discussed benign etiology and prognosis.  scalp x 5, right temple x 1 (6) Erythematous thin papules/macules with gritty scale.   Assessment & Plan   SKIN CANCER SCREENING PERFORMED TODAY.  LENTIGINES, SEBORRHEIC KERATOSES, HEMANGIOMAS - Benign normal skin lesions - Benign-appearing - Call for any changes  MELANOCYTIC NEVI - Tan-brown and/or pink-flesh-colored symmetric macules and papules - Benign appearing on exam today - Observation - Call clinic for new or changing moles - Recommend daily use of broad spectrum spf 30+ sunscreen to sun-exposed areas.   TELANGIECTASIA Exam: dilated blood vessel-feet   Treatment Plan: Benign appearing on exam Call for changes   INFLAMED SEBORRHEIC  KERATOSIS (3) left temple x 2, left deltoid x 1 (3) Symptomatic, irritating, patient would like treated.  Destruction of lesion - left temple x 2, left deltoid x 1 (3) Complexity: simple   Destruction method: cryotherapy   Informed consent: discussed and consent obtained   Timeout:  patient name, date of birth, surgical site, and procedure verified Lesion destroyed using liquid nitrogen: Yes   Region frozen until ice ball extended beyond lesion: Yes   Outcome: patient tolerated procedure well with no complications   Post-procedure details: wound care instructions given    AK (ACTINIC KERATOSIS) (6) scalp x 5, right temple x 1 (6) ACTINIC DAMAGE WITH PRECANCEROUS ACTINIC KERATOSES Counseling for Topical Chemotherapy Management: Patient exhibits: - Severe, confluent actinic changes with pre-cancerous actinic keratoses that is secondary to cumulative UV radiation exposure over time - Condition that is severe; chronic, not at goal. - diffuse scaly erythematous macules and papules with underlying dyspigmentation - Discussed Prescription Field Treatment topical Chemotherapy for Severe, Chronic Confluent Actinic Changes with Pre-Cancerous Actinic Keratoses Field treatment involves treatment of an entire area of skin that has confluent Actinic Changes (Sun/ Ultraviolet light damage) and PreCancerous Actinic Keratoses by method of PhotoDynamic Therapy (PDT) and/or prescription Topical Chemotherapy agents such as 5-fluorouracil , 5-fluorouracil /calcipotriene, and/or imiquimod.  The purpose is to decrease the number of clinically evident and subclinical PreCancerous lesions to prevent progression to development of skin cancer by chemically destroying early precancer changes that may or may not be visible.  It has been shown to reduce the risk of developing skin cancer in the treated area. As a result of treatment, redness, scaling, crusting, and open sores may occur during treatment course. One  or more  than one of these methods may be used and may have to be used several times to control, suppress and eliminate the PreCancerous changes. Discussed treatment course, expected reaction, and possible side effects. - Recommend daily broad spectrum sunscreen SPF 30+ to sun-exposed areas, reapply every 2 hours as needed.  - Staying in the shade or wearing long sleeves, sun glasses (UVA+UVB protection) and wide brim hats (4-inch brim around the entire circumference of the hat) are also recommended. - Call for new or changing lesions.   In September begin 5FU/Calcipotriene cream apply to the crown of his scalp twice a day for 10 days Destruction of lesion - scalp x 5, right temple x 1 (6) Complexity: simple   Destruction method: cryotherapy   Informed consent: discussed and consent obtained   Timeout:  patient name, date of birth, surgical site, and procedure verified Lesion destroyed using liquid nitrogen: Yes   Region frozen until ice ball extended beyond lesion: Yes   Outcome: patient tolerated procedure well with no complications   Post-procedure details: wound care instructions given      Return in about 6 months (around 11/30/2024) for Aks .  IFay Reynolds, CMA, am acting as scribe for Troy Rhyme, MD .   Documentation: I have reviewed the above documentation for accuracy and completeness, and I agree with the above.  Troy Rhyme, MD

## 2024-05-30 NOTE — Patient Instructions (Addendum)
 Instructions for [facility] Medications  One or more of your medications was sent to the [facility] mail order compounding pharmacy. You will receive an email from them and can purchase the medicine through that link. It will then be mailed to your home at the address you confirmed. If for any reason you do not receive an email from them, please check your spam folder. If you still do not find the email, please let us  know. [facility] phone number is [phone].     5-Fluorouracil /Calcipotriene Patient Education  In September begin apply to the crown of his scalp twice a day for 10 days  Actinic keratoses are the dry, red scaly spots on the skin caused by sun damage. A portion of these spots can turn into skin cancer with time, and treating them can help prevent development of skin cancer.   Treatment of these spots requires removal of the defective skin cells. There are various ways to remove actinic keratoses, including freezing with liquid nitrogen, treatment with creams, or treatment with a blue light procedure in the office.   5-fluorouracil  cream is a topical cream used to treat actinic keratoses. It works by interfering with the growth of abnormal fast-growing skin cells, such as actinic keratoses. These cells peel off and are replaced by healthy ones.   5-fluorouracil /calcipotriene is a combination of the 5-fluorouracil  cream with a vitamin D analog cream called calcipotriene. The calcipotriene alone does not treat actinic keratoses. However, when it is combined with 5-fluorouracil , it helps the 5-fluorouracil  treat the actinic keratoses much faster so that the same results can be achieved with a much shorter treatment time.  INSTRUCTIONS FOR 5-FLUOROURACIL /CALCIPOTRIENE CREAM:   5-fluorouracil /calcipotriene cream typically only needs to be used for 4-7 days. A thin layer should be applied twice a day to the treatment areas recommended by your physician.   If your  physician prescribed you separate tubes of 5-fluourouracil and calcipotriene, apply a thin layer of 5-fluorouracil  followed by a thin layer of calcipotriene.   Avoid contact with your eyes, nostrils, and mouth. Do not use 5-fluorouracil /calcipotriene cream on infected or open wounds.   You will develop redness, irritation and some crusting at areas where you have pre-cancer damage/actinic keratoses. IF YOU DEVELOP PAIN, BLEEDING, OR SIGNIFICANT CRUSTING, STOP THE TREATMENT EARLY - you have already gotten a good response and the actinic keratoses should clear up well.  Wash your hands after applying 5-fluorouracil  5% cream on your skin.   A moisturizer or sunscreen with a minimum SPF 30 should be applied each morning.   Once you have finished the treatment, you can apply a thin layer of Vaseline twice a day to irritated areas to soothe and calm the areas more quickly. If you experience significant discomfort, contact your physician.  For some patients it is necessary to repeat the treatment for best results.  SIDE EFFECTS: When using 5-fluorouracil /calcipotriene cream, you may have mild irritation, such as redness, dryness, swelling, or a mild burning sensation. This usually resolves within 2 weeks. The more actinic keratoses you have, the more redness and inflammation you can expect during treatment. Eye irritation has been reported rarely. If this occurs, please let us  know.  If you have any trouble using this cream, please call the office. If you have any other questions about this information, please do not hesitate to ask me before you leave the office.       Cryotherapy Aftercare  Wash gently with soap and water everyday.   Apply  Vaseline and Band-Aid daily until healed.      Due to recent changes in healthcare laws, you may see results of your pathology and/or laboratory studies on MyChart before the doctors have had a chance to review them. We understand that in some cases there  may be results that are confusing or concerning to you. Please understand that not all results are received at the same time and often the doctors may need to interpret multiple results in order to provide you with the best plan of care or course of treatment. Therefore, we ask that you please give us  2 business days to thoroughly review all your results before contacting the office for clarification. Should we see a critical lab result, you will be contacted sooner.   If You Need Anything After Your Visit  If you have any questions or concerns for your doctor, please call our main line at [phone] and press option 4 to reach your doctor's medical assistant. If no one answers, please leave a voicemail as directed and we will return your call as soon as possible. Messages left after 4 pm will be answered the following business day.   You may also send us  a message via MyChart. We typically respond to MyChart messages within 1-2 business days.  For prescription refills, please ask your pharmacy to contact our office. Our fax number is [phone].  If you have an urgent issue when the clinic is closed that cannot wait until the next business day, you can page your doctor at the number below.    Please note that while we do our best to be available for urgent issues outside of office hours, we are not available 24/7.   If you have an urgent issue and are unable to reach us , you may choose to seek medical care at your doctor's office, retail clinic, urgent care center, or emergency room.  If you have a medical emergency, please immediately call 911 or go to the emergency department.  Pager Numbers  - Dr. Hester: [phone]  - Dr. Jackquline: [phone]  - Dr. Claudene: [phone]   In the event of inclement weather, please call our main line at [phone] for an update on the status of any delays or closures.  Dermatology Medication Tips: Please keep the boxes that topical  medications come in in order to help keep track of the instructions about where and how to use these. Pharmacies typically print the medication instructions only on the boxes and not directly on the medication tubes.   If your medication is too expensive, please contact our office at [phone] option 4 or send us  a message through MyChart.   We are unable to tell what your co-pay for medications will be in advance as this is different depending on your insurance coverage. However, we may be able to find a substitute medication at lower cost or fill out paperwork to get insurance to cover a needed medication.   If a prior authorization is required to get your medication covered by your insurance company, please allow us  1-2 business days to complete this process.  Drug prices often vary depending on where the prescription is filled and some pharmacies may offer cheaper prices.  The website www.goodrx.com contains coupons for medications through different pharmacies. The prices here do not account for what the cost may be with help from insurance (it may be cheaper with your insurance), but the website can give you the price if you did not use any  insurance.  - You can print the associated coupon and take it with your prescription to the pharmacy.  - You may also stop by our office during regular business hours and pick up a GoodRx coupon card.  - If you need your prescription sent electronically to a different pharmacy, notify our office through [facility] or by phone at [phone] option 4.     Si Usted Necesita Algo Despus de Su Visita  Tambin puede enviarnos un mensaje a travs de Clinical cytogeneticist. Por lo general respondemos a los mensajes de MyChart en el transcurso de 1 a 2 das hbiles.  Para renovar recetas, por favor pida a su farmacia que se ponga en contacto con nuestra oficina. Randi lakes de fax es [place] [phone].  Si tiene un asunto urgente cuando la clnica est  cerrada y que no puede esperar hasta el siguiente da hbil, puede llamar/localizar a su doctor(a) al nmero que aparece a continuacin.   Por favor, tenga en cuenta que aunque hacemos todo lo posible para estar disponibles para asuntos urgentes fuera del horario de [place], no estamos disponibles las 24 horas del da, los 7 [address]  [address] de la [place].   Si tiene un problema urgente y no puede comunicarse con nosotros, puede optar por buscar atencin mdica  en el consultorio de su doctor(a), en una clnica privada, en un centro de atencin urgente o en una sala de emergencias.  Si tiene Engineer, drilling, por favor llame inmediatamente al 911 o vaya a la sala de emergencias.  Nmeros de bper  - Dr. Hester: [phone]  - Dra. Jackquline: [phone]  - Dr. Claudene: [phone]   En caso de inclemencias del tiempo, por favor llame a landry capes principal al [phone] para una actualizacin sobre el [place] de cualquier retraso o cierre.  Consejos para la medicacin en dermatologa: Por favor, guarde las cajas en las que vienen los medicamentos de uso tpico para ayudarle a seguir las instrucciones sobre dnde y cmo usarlos. Las farmacias generalmente imprimen las instrucciones del medicamento slo en las cajas y no directamente en los tubos del [place].   Si su medicamento es muy caro, por favor, pngase en contacto con landry rieger llamando al [phone] y presione la opcin 4 o envenos un mensaje a travs de Clinical cytogeneticist.   No podemos decirle cul ser su copago por los medicamentos por adelantado ya que esto es diferente dependiendo de la cobertura de su seguro. Sin embargo, es posible que podamos encontrar un medicamento sustituto a Audiological scientist un formulario para que el seguro cubra el medicamento que se considera necesario.   Si se requiere una autorizacin previa para que su compaa de seguros malta su medicamento, por favor permtanos de 1 a 2 das hbiles para  completar este proceso.  Los precios de los medicamentos varan con frecuencia dependiendo del Environmental consultant de dnde se surte la receta y alguna farmacias pueden ofrecer precios ms baratos.  El sitio web www.goodrx.com tiene cupones para medicamentos de Health and safety inspector. Los precios aqu no tienen en cuenta lo que podra costar con la ayuda del seguro (puede ser ms barato con su seguro), pero el sitio web puede darle el precio si no utiliz Tourist information centre manager.  - Puede imprimir el cupn correspondiente y llevarlo con su receta a la farmacia.  - Tambin puede pasar por nuestra oficina durante el horario de atencin regular y Education officer, museum una tarjeta de cupones de GoodRx.  - Si necesita que su receta se enve electrnicamente a  una farmacia diferente, informe a nuestra oficina a travs de MyChart de [place] o por telfono llamando al [phone] y presione la opcin 4.

## 2024-05-31 ENCOUNTER — Encounter: Payer: Self-pay | Admitting: Dermatology

## 2024-05-31 ENCOUNTER — Other Ambulatory Visit: Payer: Self-pay

## 2024-08-10 ENCOUNTER — Ambulatory Visit
Admission: RE | Admit: 2024-08-10 | Discharge: 2024-08-10 | Disposition: A | Discharge status: Home / Self Care | Source: Ambulatory Visit | Attending: Internal Medicine | Admitting: Internal Medicine

## 2024-08-10 DIAGNOSIS — C8203 Follicular lymphoma grade I, intra-abdominal lymph nodes: Secondary | ICD-10-CM

## 2024-08-10 LAB — GLUCOSE, CAPILLARY: Glucose-Capillary: 92 mg/dL (ref 70–99)

## 2024-08-12 ENCOUNTER — Encounter
Admission: RE | Admit: 2024-08-12 | Discharge: 2024-08-12 | Disposition: A | Discharge status: Home / Self Care | Source: Ambulatory Visit | Attending: Internal Medicine | Admitting: Internal Medicine

## 2024-08-12 DIAGNOSIS — C8301 Small cell B-cell lymphoma, lymph nodes of head, face, and neck: Secondary | ICD-10-CM | POA: Diagnosis present

## 2024-08-12 DIAGNOSIS — C8203 Follicular lymphoma grade I, intra-abdominal lymph nodes: Secondary | ICD-10-CM | POA: Diagnosis present

## 2024-08-12 DIAGNOSIS — R590 Localized enlarged lymph nodes: Secondary | ICD-10-CM | POA: Insufficient documentation

## 2024-08-12 LAB — GLUCOSE, CAPILLARY: Glucose-Capillary: 104 mg/dL — ABNORMAL HIGH (ref 70–99)

## 2024-08-12 MED ORDER — FLUDEOXYGLUCOSE F - 18 (FDG) INJECTION
11.2800 | Freq: Once | INTRAVENOUS | Status: AC | PRN
  Administered 2024-08-12: 11.28 via INTRAVENOUS

## 2024-08-19 ENCOUNTER — Other Ambulatory Visit: Payer: Self-pay | Admitting: Internal Medicine

## 2024-08-19 ENCOUNTER — Encounter: Payer: Self-pay | Admitting: Internal Medicine

## 2024-08-19 ENCOUNTER — Inpatient Hospital Stay (HOSPITAL_BASED_OUTPATIENT_CLINIC_OR_DEPARTMENT_OTHER): Admitting: Internal Medicine

## 2024-08-19 ENCOUNTER — Inpatient Hospital Stay: Attending: Internal Medicine

## 2024-08-19 VITALS — BP 134/88 | HR 76 | Temp 96.6°F | Resp 18 | Ht 72.0 in | Wt 203.6 lb

## 2024-08-19 DIAGNOSIS — C8203 Follicular lymphoma grade I, intra-abdominal lymph nodes: Secondary | ICD-10-CM | POA: Diagnosis not present

## 2024-08-19 DIAGNOSIS — C8200 Follicular lymphoma grade I, unspecified site: Secondary | ICD-10-CM | POA: Diagnosis present

## 2024-08-19 LAB — CMP (CANCER CENTER ONLY)
ALT: 14 U/L (ref 0–44)
AST: 25 U/L (ref 15–41)
Albumin: 4.1 g/dL (ref 3.5–5.0)
Alkaline Phosphatase: 71 U/L (ref 38–126)
Anion gap: 7 (ref 5–15)
BUN: 16 mg/dL (ref 8–23)
CO2: 24 mmol/L (ref 22–32)
Calcium: 8.7 mg/dL — ABNORMAL LOW (ref 8.9–10.3)
Chloride: 105 mmol/L (ref 98–111)
Creatinine: 0.95 mg/dL (ref 0.61–1.24)
GFR, Estimated: >60 mL/min (ref >60)
Glucose, Bld: 97 mg/dL (ref 70–99)
Potassium: 3.8 mmol/L (ref 3.5–5.1)
Sodium: 136 mmol/L (ref 135–145)
Total Bilirubin: 1.2 mg/dL (ref 0.0–1.2)
Total Protein: 6.7 g/dL (ref 6.5–8.1)

## 2024-08-19 LAB — CBC WITH DIFFERENTIAL (CANCER CENTER ONLY)
Abs Immature Granulocytes: 0.07 K/uL (ref 0.00–0.07)
Basophils Absolute: 0.1 K/uL (ref 0.0–0.1)
Basophils Relative: 2 %
Eosinophils Absolute: 0.4 K/uL (ref 0.0–0.5)
Eosinophils Relative: 7 %
HCT: 50.3 % (ref 39.0–52.0)
Hemoglobin: 17.1 g/dL — ABNORMAL HIGH (ref 13.0–17.0)
Immature Granulocytes: 1 %
Lymphocytes Relative: 16 %
Lymphs Abs: 0.8 K/uL (ref 0.7–4.0)
MCH: 29.3 pg (ref 26.0–34.0)
MCHC: 34 g/dL (ref 30.0–36.0)
MCV: 86.1 fL (ref 80.0–100.0)
Monocytes Absolute: 0.7 K/uL (ref 0.1–1.0)
Monocytes Relative: 13 %
Neutro Abs: 3.1 K/uL (ref 1.7–7.7)
Neutrophils Relative %: 61 %
Platelet Count: 218 K/uL (ref 150–400)
RBC: 5.84 MIL/uL — ABNORMAL HIGH (ref 4.22–5.81)
RDW: 13.3 % (ref 11.5–15.5)
WBC Count: 5.2 K/uL (ref 4.0–10.5)
nRBC: 0 % (ref 0.0–0.2)

## 2024-08-19 LAB — LACTATE DEHYDROGENASE: LDH: 179 U/L (ref 98–192)

## 2024-08-19 NOTE — Progress Notes (Signed)
 [facility] OFFICE PROGRESS NOTE  Patient Care Team: Cleotilde Oneil FALCON, MD as PCP - General (Internal Medicine) Fleeta Dorothyann CROME, MD as Consulting Physician (Surgery) Aundria Ladell POUR, MD as Consulting Physician (Gastroenterology) Rennie Cindy SAUNDERS, MD as Consulting Physician (Internal Medicine)   Cancer Staging  No matching staging information was found for the patient.    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 Recm surviellance; Dr.Crystal.  # July-AUG 2020-PET scan-recurrent/progression of follicular lymphoma-asymptomatic continue surveillance   # #progressive lymphadenopathy concerning for progression of his follicle lymphoma-recommend proceeding with weekly rituxan  weekly x4. 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  Accompanied by his daughter-in-law.  Troy Reynolds 73 y.o.  male pleasant  patient above prior history of diffuse large B cell  lymphoma [2014] and history of follicular lymphoma G-1 [2017] - with progressive follicular lymphoma s/p  rituxan  weekly x4 [JULY 2025] is here for a follow up-is here to review the results of his PET scan.  Patient denies any abdominal pain.  Denies any headaches or fevers or chills. No significant nausea or vomiting.     No weight loss. Denies abdominal pain.   Review of Systems  Constitutional:  Negative for chills, diaphoresis, fever and weight loss.  HENT:  Negative for nosebleeds and sore throat.   Eyes:  Negative for double vision.  Respiratory:  Negative for cough, hemoptysis, sputum production, shortness of breath and wheezing.   Cardiovascular:  Negative for chest pain, palpitations, orthopnea and leg swelling.  Gastrointestinal:  Positive for constipation. Negative for abdominal pain, blood in stool, diarrhea, heartburn, melena and vomiting.       Abdominal discomfort/bloating.  Genitourinary:  Negative for dysuria, frequency and urgency.  Musculoskeletal:  Negative for back pain and joint pain.  Skin: Negative.  Negative for itching and rash.  Neurological:  Negative for dizziness, tingling, focal weakness, weakness and headaches.  Endo/Heme/Allergies:  Does not bruise/bleed easily.  Psychiatric/Behavioral:  Negative for depression. The patient is not nervous/anxious and does not have insomnia.     PAST MEDICAL HISTORY :  Past Medical History:  Diagnosis Date   Actinic keratosis    Aortic atherosclerosis    Arthritis    B12 deficiency    Bilateral inguinal hernia    BPH (benign prostatic hyperplasia)    Colon polyp, hyperplastic    Crohn's disease (HCC)    Diverticulosis    DLBCL (diffuse large B cell lymphoma) (HCC) 2014   Chemo tx's.   GERD (  gastroesophageal reflux disease)    Hiatal hernia    HLD (hyperlipidemia)    Hypertension    Hypothyroid    Kidney stones    OSA on CPAP    Primary Parkinson's disease  (HCC)    RBBB (right bundle branch block)    Renal cyst, left    Skin cancer    R jaw - treated by Dr. Charlott many years ago - unsure which type   Thoracic aortic aneurysm 07/17/2020   a.) CT AP 07/17/2020: 4.2 cm; b.) CT AP 07/17/2021: 4.1 cm; c.) CT AP 07/02/2022: 4.1 cm   TIA (transient ischemic attack)     PAST SURGICAL HISTORY :   Past Surgical History:  Procedure Laterality Date   BOWEL RESECTION N/A 06/19/2016   Procedure: SMALL BOWEL RESECTION;  Surgeon: Dorothyann LITTIE Husk, MD;  Location: [facility] ORS;  Service: General;  Laterality: N/A;   CHOLECYSTECTOMY     COLONOSCOPY     COLONOSCOPY WITH PROPOFOL  N/A 07/19/2021   Procedure: COLONOSCOPY WITH PROPOFOL ;  Surgeon: Maryruth Ole DASEN, MD;  Location: [facility] ENDOSCOPY;  Service: Endoscopy;  Laterality: N/A;   ESOPHAGOGASTRODUODENOSCOPY     ESOPHAGOGASTRODUODENOSCOPY (EGD) WITH PROPOFOL  N/A 07/19/2021   Procedure: ESOPHAGOGASTRODUODENOSCOPY (EGD) WITH PROPOFOL ;  Surgeon: Maryruth Ole DASEN, MD;  Location: [facility] ENDOSCOPY;  Service: Endoscopy;  Laterality: N/A;   LAPAROSCOPIC REMOVAL OF MESENTERIC MASS N/A 06/19/2016   Procedure: LAPAROSCOPIC REMOVAL OF MESENTERIC MASS;  Surgeon: Dorothyann LITTIE Husk, MD;  Location: [facility] ORS;  Service: General;  Laterality: N/A;   LYMPH NODE DISSECTION     PORT-A-CATH REMOVAL     PORTACATH PLACEMENT      FAMILY HISTORY :   Family History  Problem Relation Age of Onset   Clotting disorder Mother    Pulmonary embolism Mother    Lymphoma Father    Hiatal hernia Father    Heart disease Father        stent placed   Hypertension Father    Ulcers Father    Brain cancer Maternal Aunt    Cancer Paternal Uncle        esophagus    SOCIAL HISTORY:   Social History   Tobacco Use   Smoking status: Never   Smokeless tobacco: Never  Vaping Use   Vaping status: Never Used  Substance Use Topics   Alcohol use: No   Drug use: No    ALLERGIES:  has no known allergies.  MEDICATIONS:  Current  Outpatient Medications  Medication Sig Dispense Refill   carbidopa-levodopa (SINEMET CR) 50-200 MG tablet Take 1 tablet by mouth 2 (two) times daily.     cyanocobalamin (VITAMIN B12) 1000 MCG tablet Take 1,000 mcg by mouth daily.     levothyroxine (SYNTHROID) 75 MCG tablet 100 mcg.     losartan -hydrochlorothiazide  (HYZAAR) 50-12.5 MG per tablet Take 1 tablet by mouth daily.     naproxen sodium (ALEVE) 220 MG tablet Take 220 mg by mouth daily as needed.     pantoprazole  (PROTONIX ) 40 MG tablet Take 40 mg by mouth daily.      simvastatin (ZOCOR) 20 MG tablet Take 1 tablet by mouth daily.     tamsulosin (FLOMAX) 0.4 MG CAPS capsule Take 0.4 mg by mouth daily.     No current facility-administered medications for this visit.    PHYSICAL EXAMINATION: ECOG PERFORMANCE STATUS: 1 - Symptomatic but completely ambulatory  BP 134/88 (BP Location: Left Arm, Patient Position: Sitting, Cuff Size: Large)   Pulse 76   Temp ROLLEN)  96.6 F (35.9 C) (Tympanic)   Resp 18   Ht 6' (1.829 m)   Wt 203 lb 9.6 oz (92.4 kg)   SpO2 100%   BMI 27.61 kg/m   Filed Weights   08/19/24 0943  Weight: 203 lb 9.6 oz (92.4 kg)    Physical Exam HENT:     Head: Normocephalic and atraumatic.     Mouth/Throat:     Pharynx: No oropharyngeal exudate.  Eyes:     Pupils: Pupils are equal, round, and reactive to light.  Cardiovascular:     Rate and Rhythm: Normal rate and regular rhythm.  Pulmonary:     Effort: No respiratory distress.     Breath sounds: No wheezing.  Abdominal:     General: Bowel sounds are normal. There is no distension.     Palpations: Abdomen is soft. There is no mass.     Tenderness: There is no abdominal tenderness. There is no guarding or rebound.     Comments: Midabdominal incision noted.  Well-healed.  Musculoskeletal:        General: No tenderness. Normal range of motion.     Cervical back: Normal range of motion and neck supple.  Skin:    General: Skin is warm.  Neurological:      Mental Status: He is alert and oriented to person, place, and time.  Psychiatric:        Mood and Affect: Affect normal.       LABORATORY DATA:  I have reviewed the data as listed    Component Value Date/Time   NA 136 08/19/2024 0941   NA 140 03/12/2015 1007   K 3.8 08/19/2024 0941   K 3.7 03/12/2015 1007   CL 105 08/19/2024 0941   CL 105 03/12/2015 1007   CO2 24 08/19/2024 0941   CO2 29 03/12/2015 1007   GLUCOSE 97 08/19/2024 0941   GLUCOSE 143 (H) 03/12/2015 1007   BUN 16 08/19/2024 0941   BUN 15 03/12/2015 1007   CREATININE 0.95 08/19/2024 0941   CREATININE 0.92 03/12/2015 1007   CALCIUM 8.7 (L) 08/19/2024 0941   CALCIUM 8.6 (L) 03/12/2015 1007   PROT 6.7 08/19/2024 0941   PROT 6.6 03/12/2015 1007   ALBUMIN 4.1 08/19/2024 0941   ALBUMIN 4.0 03/12/2015 1007   AST 25 08/19/2024 0941   ALT 14 08/19/2024 0941   ALT 37 03/12/2015 1007   ALKPHOS 71 08/19/2024 0941   ALKPHOS 79 03/12/2015 1007   BILITOT 1.2 08/19/2024 0941   GFRNONAA >60 08/19/2024 0941   GFRNONAA >60 03/12/2015 1007   GFRAA >60 07/19/2020 1011   GFRAA >60 03/12/2015 1007    No results found for: SPEP, UPEP  Lab Results  Component Value Date   WBC 5.2 08/19/2024   NEUTROABS 3.1 08/19/2024   HGB 17.1 (H) 08/19/2024   HCT 50.3 08/19/2024   MCV 86.1 08/19/2024   PLT 218 08/19/2024      Chemistry      Component Value Date/Time   NA 136 08/19/2024 0941   NA 140 03/12/2015 1007   K 3.8 08/19/2024 0941   K 3.7 03/12/2015 1007   CL 105 08/19/2024 0941   CL 105 03/12/2015 1007   CO2 24 08/19/2024 0941   CO2 29 03/12/2015 1007   BUN 16 08/19/2024 0941   BUN 15 03/12/2015 1007   CREATININE 0.95 08/19/2024 0941   CREATININE 0.92 03/12/2015 1007      Component Value Date/Time   CALCIUM 8.7 (L) 08/19/2024 9058  CALCIUM 8.6 (L) 03/12/2015 1007   ALKPHOS 71 08/19/2024 0941   ALKPHOS 79 03/12/2015 1007   AST 25 08/19/2024 0941   ALT 14 08/19/2024 0941   ALT 37 03/12/2015 1007   BILITOT  1.2 08/19/2024 0941       RADIOGRAPHIC STUDIES: I have personally reviewed the radiological images as listed and agreed with the findings in the report. No results found.   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  # FEB PET scan- 2025- Progressive hypermetabolic abdominal and pelvic adenopathy, measuring up into the low Deauville 5 range. No splenomegaly or focal hypermetabolic splenic activity. JUNE 2025-  weekly rituxan  weekly x4.  hepatitis work-up NEGATIVE.    # Status post rituximab  in July 2025-follow-up SEP 2025-  PET scan -Response to therapy of abdominal pelvic nodal lymphoma. Deauville 4. no new or progressive disease. I reviewed the partial response noted on the PET scan-both in terms of size and also FDG avidity.   # I reviewed the the data for maintenance rituximab  every 2 months for the next 2 years versus observation.  Discussed that time for next treatment is significantly prolonged with the maintenance arm compared to observation.  However there is no difference in the overall survival between the 2 approaches.  Patient is reluctant-given his history of Parkinson's excetra.  Her daughter-in-law seems to be interested with maintenance therapy.  Also reviewed with the daughter-in-law regarding-the time for next treatment with induction only rituximab  [without maintenance]-the time the next treatment is about 10 to 15 years.  I think is reasonable to hold off maintenance rituximab . However, however further decisions to be made at the next visit.   # Parkinson- mild- carbi-dopa-love-dopa- [Dr.Shah]- stable   # COnstipation- recommend Miralax BID; add colace [? 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 stable   # Prostate enlargement- [on Flomax]- recommend evaluation with urology. Defer to PCP.  # Incidental findings on Imaging   PET CT , 2025:  Hiatal hernia. Aortic atherosclerosis bilateral nephrolithiasis. I reviewed/discussed/counseled the patient.   # IV access: PIV  # Vaccinations: recommend   my chart appt Q 72m  # DISPOSITION: # follow up in 8 weeks- MD; labs- cbc/cmp;LDH-  possible Rituxan  Dr. B  # I reviewed the blood work- with the patient in detail; also reviewed the imaging independently [as summarized above]; and with the patient in detail.      Orders Placed This Encounter  Procedures   CBC with Differential (Cancer Center Only)    Standing Status:   Future    Expected Date:   10/14/2024    Expiration Date:   01/12/2025   CMP (Cancer Center only)    Standing Status:   Future    Expected Date:   10/14/2024    Expiration Date:   01/12/2025   Lactate dehydrogenase    Standing Status:   Future    Expected Date:   10/14/2024    Expiration Date:   01/12/2025   All questions were answered. The patient knows to call the clinic with any problems, questions or concerns.      Cindy JONELLE Joe, MD 08/22/2024 12:56 PM

## 2024-08-19 NOTE — Assessment & Plan Note (Addendum)
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  # FEB PET scan- 2025- Progressive hypermetabolic abdominal and pelvic adenopathy, measuring up into the low Deauville 5 range. No splenomegaly or focal hypermetabolic splenic activity. JUNE 2025-  weekly rituxan  weekly x4.  hepatitis work-up NEGATIVE.    # Status post rituximab  in July 2025-follow-up SEP 2025-  PET scan -Response to therapy of abdominal pelvic nodal lymphoma. Deauville 4. no new or progressive disease. I reviewed the partial response noted on the PET scan-both in terms of size and also FDG avidity.   # I reviewed the the data for maintenance rituximab  every 2 months for the next 2 years versus observation.  Discussed that time for next treatment is significantly prolonged with the maintenance arm compared to observation.  However there is no difference in the overall survival between the 2 approaches.  Patient is reluctant-given his history of Parkinson's excetra.  Her daughter-in-law seems to be interested with maintenance therapy.  Also reviewed with the daughter-in-law regarding-the time for next treatment with induction only rituximab  [without maintenance]-the time the next treatment is about 10 to 15 years.  I think is reasonable to hold off maintenance rituximab . However, however further decisions to be made at the next visit.   # Parkinson- mild- carbi-dopa-love-dopa- [Dr.Shah]- stable   # COnstipation- recommend Miralax BID; add colace [? 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 stable   # Prostate enlargement- [on Flomax]- recommend evaluation with urology. Defer to PCP.  # Incidental findings on Imaging  PET CT , 2025:  Hiatal hernia. Aortic atherosclerosis bilateral nephrolithiasis. I reviewed/discussed/counseled the patient.   # IV access: PIV  # Vaccinations: recommend   my chart appt Q 80m  # DISPOSITION: # follow up in 8 weeks- MD; labs- cbc/cmp;LDH-   possible Rituxan  Dr. B  # I reviewed the blood work- with the patient in detail; also reviewed the imaging independently [as summarized above]; and with the patient in detail.

## 2024-08-19 NOTE — Progress Notes (Signed)
 PET 08/12/24.

## 2024-08-19 NOTE — Progress Notes (Signed)
 DISCONTINUE ON PATHWAY REGIMEN - Lymphoma and CLL     A cycle is every 7 days:     Rituximab -xxxx   **Always confirm dose/schedule in your pharmacy ordering system**  PRIOR TREATMENT: LYOS201: Rituximab  375 mg/m2 IV Weekly x 4 Weeks  START ON PATHWAY REGIMEN - Lymphoma and CLL     A cycle is every 7 days:     Rituximab -xxxx   **Always confirm dose/schedule in your pharmacy ordering system**  Patient Characteristics: Follicular Lymphoma, Grades 1, 2, and 3A, First Line, Stage III / IV, Asymptomatic or Low Bulk Disease Disease Type: Follicular Lymphoma, Grade 1, 2, or 3A Disease Type: Not Applicable Disease Type: Not Applicable Line of Therapy: First Line Disease Characteristics: Asymptomatic or Low Bulk Disease Intent of Therapy: Non-Curative / Palliative Intent, Discussed with Patient

## 2024-08-22 ENCOUNTER — Encounter: Payer: Self-pay | Admitting: Internal Medicine

## 2024-08-25 ENCOUNTER — Telehealth: Payer: Self-pay | Admitting: Internal Medicine

## 2024-08-25 NOTE — Telephone Encounter (Signed)
 Pt called and said that he thought his appts might need to be changed. Said he wanted to wait til [facility]on is back tomorrow, but also said that he thought Dr B wanted to call him back with some kind of result? Said something about moving his 8 week appts back to 6 month appts as well. - LH

## 2024-08-29 ENCOUNTER — Other Ambulatory Visit: Payer: Self-pay | Admitting: Internal Medicine

## 2024-08-29 ENCOUNTER — Encounter: Payer: Self-pay | Admitting: Internal Medicine

## 2024-08-29 DIAGNOSIS — C8203 Follicular lymphoma grade I, intra-abdominal lymph nodes: Secondary | ICD-10-CM

## 2024-08-29 NOTE — Progress Notes (Signed)
 Will cancel maintenance therapy;   Follow-up in 6 months MD labs CBC CMP LDH; CT scan prior- GB

## 2024-08-29 NOTE — Progress Notes (Signed)
 Labs and scan orders entered.

## 2024-08-30 ENCOUNTER — Other Ambulatory Visit: Payer: Self-pay

## 2024-10-14 ENCOUNTER — Other Ambulatory Visit

## 2024-10-14 ENCOUNTER — Ambulatory Visit

## 2024-10-14 ENCOUNTER — Ambulatory Visit: Admitting: Internal Medicine

## 2024-12-01 ENCOUNTER — Ambulatory Visit: Admitting: Dermatology

## 2024-12-01 ENCOUNTER — Encounter: Payer: Self-pay | Admitting: Dermatology

## 2024-12-01 DIAGNOSIS — L299 Pruritus, unspecified: Secondary | ICD-10-CM

## 2024-12-01 DIAGNOSIS — Z872 Personal history of diseases of the skin and subcutaneous tissue: Secondary | ICD-10-CM

## 2024-12-01 DIAGNOSIS — L820 Inflamed seborrheic keratosis: Secondary | ICD-10-CM

## 2024-12-01 DIAGNOSIS — L309 Dermatitis, unspecified: Secondary | ICD-10-CM | POA: Diagnosis not present

## 2024-12-01 DIAGNOSIS — L821 Other seborrheic keratosis: Secondary | ICD-10-CM

## 2024-12-01 DIAGNOSIS — L853 Xerosis cutis: Secondary | ICD-10-CM

## 2024-12-01 DIAGNOSIS — Z7189 Other specified counseling: Secondary | ICD-10-CM

## 2024-12-01 DIAGNOSIS — W908XXA Exposure to other nonionizing radiation, initial encounter: Secondary | ICD-10-CM | POA: Diagnosis not present

## 2024-12-01 DIAGNOSIS — L570 Actinic keratosis: Secondary | ICD-10-CM

## 2024-12-01 DIAGNOSIS — L578 Other skin changes due to chronic exposure to nonionizing radiation: Secondary | ICD-10-CM

## 2024-12-01 DIAGNOSIS — L2089 Other atopic dermatitis: Secondary | ICD-10-CM

## 2024-12-01 DIAGNOSIS — Z79899 Other long term (current) drug therapy: Secondary | ICD-10-CM

## 2024-12-01 MED ORDER — MOMETASONE FUROATE 0.1 % EX CREA: TOPICAL_CREAM | CUTANEOUS | 1 refills | Status: AC

## 2024-12-01 NOTE — Progress Notes (Signed)
 "  Follow-Up Visit   Subjective  Troy Reynolds is a 74 y.o. male who presents for the following: AK f/u s/p 5FU/calcipotriene to crown of scalp. Patient states he used cream in September x10 days.  The patient has spots, moles and lesions to be evaluated, some may be new or changing and the patient may have concern these could be cancer.  Patient complains of dry skin and itching especially on the arms and legs.  He wonders about treatment.  He has been using moisturizer.  The following portions of the chart were reviewed this encounter and updated as appropriate: medications, allergies, medical history  Review of Systems:  No other skin or systemic complaints except as noted in HPI or Assessment and Plan.  Objective  Well appearing patient in no apparent distress; mood and affect are within normal limits.  A focused examination was performed of the following areas: Scalp, face, arms, hands  Relevant exam findings are noted in the Assessment and Plan.  R crown scalp x1, L forehead x3 (4) Stuck on waxy paps with erythema Left Temple x1 Pink scaly macules  Assessment & Plan   ACTINIC DAMAGE - chronic, secondary to cumulative UV radiation exposure/sun exposure over time - diffuse scaly erythematous macules with underlying dyspigmentation - Recommend daily broad spectrum sunscreen SPF 30+ to sun-exposed areas, reapply every 2 hours as needed.  - Recommend staying in the shade or wearing long sleeves, sun glasses (UVA+UVB protection) and wide brim hats (4-inch brim around the entire circumference of the hat). - Call for new or changing lesions.  HISTORY OF PRECANCEROUS ACTINIC KERATOSIS S/p 5FU/calcipotriene scalp 07/2024, clear today.  - site(s) of PreCancerous Actinic Keratosis clear today. - these may recur and new lesions may form requiring treatment to prevent transformation into skin cancer - observe for new or changing spots and contact [facility] for appointment  if occur - photoprotection with sun protective clothing; sunglasses and broad spectrum sunscreen with SPF of at least 30 + and frequent self skin exams recommended - yearly exams by a dermatologist recommended for persons with history of PreCancerous Actinic Keratoses  SEBORRHEIC KERATOSIS - Stuck-on, waxy, tan-brown papules and/or plaques  - Benign-appearing - Discussed benign etiology and prognosis. - Observe - Call for any changes  Xerosis with mild eczema and pruritus (Winter itch) - diffuse xerotic patches - recommend gentle, hydrating skin care - gentle skin care handout given Treatment plan: Start mometasone  0.1% cream apply BID for up to 5 days for itchy skin or eczema. Patient advised does not have to fill, but will be available for patient if he needs.  INFLAMED SEBORRHEIC KERATOSIS (4) R crown scalp x1, L forehead x3 (4) Symptomatic, irritating, patient would like treated. - Destruction of lesion - R crown scalp x1, L forehead x3 (4) Complexity: simple   Destruction method: cryotherapy   Informed consent: discussed and consent obtained   Timeout:  patient name, date of birth, surgical site, and procedure verified Lesion destroyed using liquid nitrogen: Yes   Region frozen until ice ball extended beyond lesion: Yes   Outcome: patient tolerated procedure well with no complications   Post-procedure details: wound care instructions given    AK (ACTINIC KERATOSIS) Left Temple x1 Actinic keratoses are precancerous spots that appear secondary to cumulative UV radiation exposure/sun exposure over time. They are chronic with expected duration over 1 year. A portion of actinic keratoses will progress to squamous cell carcinoma of the skin. It is not possible to reliably  predict which spots will progress to skin cancer and so treatment is recommended to prevent development of skin cancer.  Recommend daily broad spectrum sunscreen SPF 30+ to sun-exposed areas, reapply every 2 hours  as needed.  Recommend staying in the shade or wearing long sleeves, sun glasses (UVA+UVB protection) and wide brim hats (4-inch brim around the entire circumference of the hat). Call for new or changing lesions. - Destruction of lesion - Left Temple x1 Complexity: simple   Destruction method: cryotherapy   Informed consent: discussed and consent obtained   Timeout:  patient name, date of birth, surgical site, and procedure verified Lesion destroyed using liquid nitrogen: Yes   Region frozen until ice ball extended beyond lesion: Yes   Outcome: patient tolerated procedure well with no complications   Post-procedure details: wound care instructions given    ACTINIC SKIN DAMAGE   OTHER ATOPIC DERMATITIS   COUNSELING AND COORDINATION OF CARE   MEDICATION MANAGEMENT   XEROSIS CUTIS   PRURITUS    Return in about 8 months (around 08/01/2025) for AK follow-up, w/ Dr. Hester.  LILLETTE Lonell ROCKFORD Wilfred, CMA, am acting as scribe for Alm Hester, MD.  Documentation: I have reviewed the above documentation for accuracy and completeness, and I agree with the above.  Alm Hester, MD    "

## 2024-12-01 NOTE — Patient Instructions (Addendum)

## 2024-12-02 ENCOUNTER — Other Ambulatory Visit: Payer: Self-pay

## 2024-12-14 ENCOUNTER — Telehealth: Payer: Self-pay | Admitting: Internal Medicine

## 2024-12-14 NOTE — Telephone Encounter (Signed)
 Patient called and asked for you to call him back Re: Scan results. Best number for call is [phone]

## 2025-02-22 ENCOUNTER — Other Ambulatory Visit

## 2025-03-08 ENCOUNTER — Other Ambulatory Visit

## 2025-03-08 ENCOUNTER — Ambulatory Visit: Admitting: Internal Medicine

## 2025-08-08 ENCOUNTER — Ambulatory Visit: Admitting: Dermatology
